# Patient Record
Sex: Female | Born: 1965
Health system: Southern US, Community
[De-identification: ages and names within clinical notes are randomized; demographics above are authoritative.]

## PROBLEM LIST (undated history)

## (undated) DIAGNOSIS — G8929 Other chronic pain: Secondary | ICD-10-CM

## (undated) DIAGNOSIS — F32A Depression, unspecified: Secondary | ICD-10-CM

## (undated) DIAGNOSIS — M549 Dorsalgia, unspecified: Secondary | ICD-10-CM

## (undated) DIAGNOSIS — F419 Anxiety disorder, unspecified: Secondary | ICD-10-CM

## (undated) DIAGNOSIS — F329 Major depressive disorder, single episode, unspecified: Secondary | ICD-10-CM

## (undated) HISTORY — PX: LAPAROSCOPIC GASTRIC SLEEVE RESECTION: SHX5895

## (undated) HISTORY — PX: KNEE ARTHROSCOPY: SUR90

## (undated) HISTORY — PX: OTHER SURGICAL HISTORY: SHX169

## (undated) HISTORY — PX: TUBAL LIGATION: SHX77

## (undated) HISTORY — PX: BACK SURGERY: SHX140

---

## 2007-04-01 DIAGNOSIS — G8929 Other chronic pain: Secondary | ICD-10-CM | POA: Insufficient documentation

## 2009-08-15 ENCOUNTER — Inpatient Hospital Stay (HOSPITAL_COMMUNITY): Admission: RE | Admit: 2009-08-15 | Discharge: 2009-08-17 | Payer: Self-pay | Admitting: Specialist

## 2010-01-30 ENCOUNTER — Inpatient Hospital Stay (HOSPITAL_COMMUNITY): Admission: RE | Admit: 2010-01-30 | Discharge: 2010-02-02 | Payer: Self-pay | Admitting: Orthopedic Surgery

## 2010-05-21 ENCOUNTER — Ambulatory Visit: Payer: Self-pay | Admitting: Diagnostic Radiology

## 2010-05-21 ENCOUNTER — Ambulatory Visit (HOSPITAL_BASED_OUTPATIENT_CLINIC_OR_DEPARTMENT_OTHER): Admission: RE | Admit: 2010-05-21 | Discharge: 2010-05-21 | Payer: Self-pay | Admitting: Obstetrics and Gynecology

## 2011-02-02 LAB — CBC
Hemoglobin: 13.3 g/dL (ref 12.0–15.0)
MCV: 82.8 fL (ref 78.0–100.0)
RDW: 15.2 % (ref 11.5–15.5)
WBC: 9.2 10*3/uL (ref 4.0–10.5)

## 2011-02-02 LAB — TYPE AND SCREEN: Antibody Screen: NEGATIVE

## 2011-02-12 LAB — URINE MICROSCOPIC-ADD ON

## 2011-02-12 LAB — BASIC METABOLIC PANEL
CO2: 24 mEq/L (ref 19–32)
Chloride: 105 mEq/L (ref 96–112)
Potassium: 3.7 mEq/L (ref 3.5–5.1)
Sodium: 135 mEq/L (ref 135–145)

## 2011-02-12 LAB — URINALYSIS, ROUTINE W REFLEX MICROSCOPIC
Bilirubin Urine: NEGATIVE
Glucose, UA: NEGATIVE mg/dL
Hgb urine dipstick: NEGATIVE
Protein, ur: NEGATIVE mg/dL

## 2011-02-12 LAB — CBC
MCHC: 34.3 g/dL (ref 30.0–36.0)
Platelets: 290 10*3/uL (ref 150–400)
RDW: 14.7 % (ref 11.5–15.5)

## 2011-05-25 ENCOUNTER — Ambulatory Visit: Payer: No Typology Code available for payment source | Attending: Psychiatry | Admitting: Rehabilitation

## 2011-05-25 ENCOUNTER — Other Ambulatory Visit: Payer: Self-pay

## 2011-05-25 ENCOUNTER — Other Ambulatory Visit (HOSPITAL_COMMUNITY)
Admission: RE | Admit: 2011-05-25 | Discharge: 2011-05-25 | Disposition: A | Discharge status: Home / Self Care | Payer: Medicaid Other | Source: Ambulatory Visit | Attending: Obstetrics and Gynecology | Admitting: Obstetrics and Gynecology

## 2011-05-25 ENCOUNTER — Other Ambulatory Visit: Payer: Self-pay | Admitting: Obstetrics and Gynecology

## 2011-05-25 DIAGNOSIS — M25549 Pain in joints of unspecified hand: Secondary | ICD-10-CM | POA: Insufficient documentation

## 2011-05-25 DIAGNOSIS — M6281 Muscle weakness (generalized): Secondary | ICD-10-CM | POA: Insufficient documentation

## 2011-05-25 DIAGNOSIS — Z01419 Encounter for gynecological examination (general) (routine) without abnormal findings: Secondary | ICD-10-CM | POA: Insufficient documentation

## 2011-05-25 DIAGNOSIS — M25649 Stiffness of unspecified hand, not elsewhere classified: Secondary | ICD-10-CM | POA: Insufficient documentation

## 2011-05-25 DIAGNOSIS — Z5189 Encounter for other specified aftercare: Secondary | ICD-10-CM | POA: Insufficient documentation

## 2011-05-25 DIAGNOSIS — Z1159 Encounter for screening for other viral diseases: Secondary | ICD-10-CM | POA: Insufficient documentation

## 2011-05-25 DIAGNOSIS — Z1231 Encounter for screening mammogram for malignant neoplasm of breast: Secondary | ICD-10-CM

## 2011-06-01 ENCOUNTER — Ambulatory Visit (HOSPITAL_BASED_OUTPATIENT_CLINIC_OR_DEPARTMENT_OTHER)
Admission: RE | Admit: 2011-06-01 | Discharge: 2011-06-01 | Disposition: A | Discharge status: Home / Self Care | Payer: Medicaid Other | Source: Ambulatory Visit | Attending: Obstetrics and Gynecology | Admitting: Obstetrics and Gynecology

## 2011-06-01 ENCOUNTER — Ambulatory Visit (HOSPITAL_BASED_OUTPATIENT_CLINIC_OR_DEPARTMENT_OTHER): Payer: No Typology Code available for payment source

## 2011-06-01 DIAGNOSIS — Z1231 Encounter for screening mammogram for malignant neoplasm of breast: Secondary | ICD-10-CM

## 2011-06-04 ENCOUNTER — Ambulatory Visit: Payer: No Typology Code available for payment source | Admitting: Occupational Therapy

## 2011-06-10 ENCOUNTER — Encounter: Payer: No Typology Code available for payment source | Admitting: Occupational Therapy

## 2011-06-12 ENCOUNTER — Ambulatory Visit: Payer: Medicaid Other | Attending: Sports Medicine | Admitting: Physical Therapy

## 2011-06-12 DIAGNOSIS — Z5189 Encounter for other specified aftercare: Secondary | ICD-10-CM | POA: Insufficient documentation

## 2011-06-12 DIAGNOSIS — M25649 Stiffness of unspecified hand, not elsewhere classified: Secondary | ICD-10-CM | POA: Insufficient documentation

## 2011-06-12 DIAGNOSIS — M6281 Muscle weakness (generalized): Secondary | ICD-10-CM | POA: Insufficient documentation

## 2011-06-12 DIAGNOSIS — M25549 Pain in joints of unspecified hand: Secondary | ICD-10-CM | POA: Insufficient documentation

## 2011-06-17 ENCOUNTER — Ambulatory Visit: Payer: Medicaid Other | Admitting: Physical Therapy

## 2011-06-17 ENCOUNTER — Ambulatory Visit: Payer: Medicaid Other | Admitting: Occupational Therapy

## 2011-06-19 ENCOUNTER — Encounter: Payer: No Typology Code available for payment source | Admitting: Occupational Therapy

## 2011-06-19 ENCOUNTER — Ambulatory Visit: Payer: No Typology Code available for payment source | Admitting: Physical Therapy

## 2011-06-23 ENCOUNTER — Ambulatory Visit: Payer: Medicaid Other | Admitting: Occupational Therapy

## 2011-06-23 ENCOUNTER — Ambulatory Visit: Payer: No Typology Code available for payment source | Attending: Psychiatry | Admitting: Physical Therapy

## 2011-06-23 DIAGNOSIS — M25649 Stiffness of unspecified hand, not elsewhere classified: Secondary | ICD-10-CM | POA: Insufficient documentation

## 2011-06-23 DIAGNOSIS — M25549 Pain in joints of unspecified hand: Secondary | ICD-10-CM | POA: Insufficient documentation

## 2011-06-23 DIAGNOSIS — M6281 Muscle weakness (generalized): Secondary | ICD-10-CM | POA: Insufficient documentation

## 2011-06-23 DIAGNOSIS — Z5189 Encounter for other specified aftercare: Secondary | ICD-10-CM | POA: Insufficient documentation

## 2011-06-25 ENCOUNTER — Ambulatory Visit: Payer: Medicaid Other | Admitting: Occupational Therapy

## 2011-06-25 ENCOUNTER — Ambulatory Visit: Payer: No Typology Code available for payment source | Admitting: Physical Therapy

## 2011-06-29 ENCOUNTER — Ambulatory Visit: Payer: No Typology Code available for payment source | Admitting: Physical Therapy

## 2011-06-29 ENCOUNTER — Ambulatory Visit: Payer: Medicaid Other | Admitting: Occupational Therapy

## 2011-07-01 ENCOUNTER — Ambulatory Visit: Payer: No Typology Code available for payment source | Admitting: Physical Therapy

## 2011-07-01 ENCOUNTER — Ambulatory Visit: Payer: Medicaid Other | Admitting: Occupational Therapy

## 2011-07-07 ENCOUNTER — Ambulatory Visit: Payer: Medicaid Other | Admitting: Occupational Therapy

## 2011-07-07 ENCOUNTER — Ambulatory Visit: Payer: No Typology Code available for payment source | Admitting: Physical Therapy

## 2011-07-10 ENCOUNTER — Ambulatory Visit: Payer: Medicaid Other | Admitting: Occupational Therapy

## 2011-07-10 ENCOUNTER — Ambulatory Visit: Payer: No Typology Code available for payment source | Admitting: Physical Therapy

## 2011-07-20 ENCOUNTER — Ambulatory Visit: Payer: Medicaid Other | Admitting: Physical Therapy

## 2011-07-20 ENCOUNTER — Ambulatory Visit: Payer: Medicaid Other | Attending: Sports Medicine | Admitting: Occupational Therapy

## 2011-07-20 DIAGNOSIS — M6281 Muscle weakness (generalized): Secondary | ICD-10-CM | POA: Insufficient documentation

## 2011-07-20 DIAGNOSIS — M25649 Stiffness of unspecified hand, not elsewhere classified: Secondary | ICD-10-CM | POA: Insufficient documentation

## 2011-07-20 DIAGNOSIS — M25549 Pain in joints of unspecified hand: Secondary | ICD-10-CM | POA: Insufficient documentation

## 2011-07-20 DIAGNOSIS — Z5189 Encounter for other specified aftercare: Secondary | ICD-10-CM | POA: Insufficient documentation

## 2011-07-24 ENCOUNTER — Ambulatory Visit: Payer: No Typology Code available for payment source | Admitting: Physical Therapy

## 2011-07-24 ENCOUNTER — Encounter: Payer: No Typology Code available for payment source | Admitting: Occupational Therapy

## 2011-07-27 ENCOUNTER — Ambulatory Visit: Payer: Medicaid Other | Admitting: Physical Therapy

## 2011-07-27 ENCOUNTER — Ambulatory Visit: Payer: Medicaid Other | Admitting: Occupational Therapy

## 2011-07-29 ENCOUNTER — Ambulatory Visit: Payer: Medicaid Other | Admitting: Physical Therapy

## 2011-07-29 ENCOUNTER — Ambulatory Visit: Payer: Medicaid Other | Admitting: Occupational Therapy

## 2011-07-31 ENCOUNTER — Emergency Department (HOSPITAL_BASED_OUTPATIENT_CLINIC_OR_DEPARTMENT_OTHER)
Admission: EM | Admit: 2011-07-31 | Discharge: 2011-08-01 | Disposition: A | Discharge status: Home / Self Care | Payer: Medicaid Other | Attending: Emergency Medicine | Admitting: Emergency Medicine

## 2011-07-31 ENCOUNTER — Encounter: Payer: Self-pay | Admitting: *Deleted

## 2011-07-31 DIAGNOSIS — R1032 Left lower quadrant pain: Secondary | ICD-10-CM | POA: Insufficient documentation

## 2011-07-31 DIAGNOSIS — Z79899 Other long term (current) drug therapy: Secondary | ICD-10-CM | POA: Insufficient documentation

## 2011-07-31 LAB — URINE MICROSCOPIC-ADD ON

## 2011-07-31 LAB — URINALYSIS, ROUTINE W REFLEX MICROSCOPIC
Glucose, UA: NEGATIVE mg/dL
Specific Gravity, Urine: 1.031 — ABNORMAL HIGH (ref 1.005–1.030)

## 2011-07-31 MED ORDER — SODIUM CHLORIDE 0.9 % IV BOLUS (SEPSIS)
1000.0000 mL | Freq: Once | INTRAVENOUS | Status: AC
  Administered 2011-07-31: 1000 mL via INTRAVENOUS

## 2011-07-31 MED ORDER — MORPHINE SULFATE 4 MG/ML IJ SOLN
4.0000 mg | Freq: Once | INTRAMUSCULAR | Status: AC
  Administered 2011-07-31: 4 mg via INTRAVENOUS
  Filled 2011-07-31: qty 1

## 2011-07-31 NOTE — ED Notes (Signed)
Pt states lower abd pain with painful urination  X 4 hrs

## 2011-08-01 ENCOUNTER — Emergency Department (INDEPENDENT_AMBULATORY_CARE_PROVIDER_SITE_OTHER): Payer: Medicaid Other

## 2011-08-01 DIAGNOSIS — R1032 Left lower quadrant pain: Secondary | ICD-10-CM

## 2011-08-01 LAB — CBC
HCT: 37.3 % (ref 36.0–46.0)
MCH: 27.4 pg (ref 26.0–34.0)
MCV: 83.6 fL (ref 78.0–100.0)
Platelets: 266 10*3/uL (ref 150–400)
RBC: 4.46 MIL/uL (ref 3.87–5.11)
RDW: 14.9 % (ref 11.5–15.5)

## 2011-08-01 LAB — DIFFERENTIAL
Eosinophils Absolute: 0.1 10*3/uL (ref 0.0–0.7)
Eosinophils Relative: 1 % (ref 0–5)
Lymphs Abs: 2.5 10*3/uL (ref 0.7–4.0)
Monocytes Absolute: 0.8 10*3/uL (ref 0.1–1.0)

## 2011-08-01 LAB — BASIC METABOLIC PANEL
Calcium: 9 mg/dL (ref 8.4–10.5)
Creatinine, Ser: 0.8 mg/dL (ref 0.50–1.10)
GFR calc non Af Amer: >60 mL/min (ref >60)
Glucose, Bld: 98 mg/dL (ref 70–99)
Sodium: 142 mEq/L (ref 135–145)

## 2011-08-01 MED ORDER — IOHEXOL 300 MG/ML  SOLN
100.0000 mL | Freq: Once | INTRAMUSCULAR | Status: AC | PRN
  Administered 2011-08-01: 100 mL via INTRAVENOUS

## 2011-08-01 NOTE — ED Provider Notes (Signed)
History     CSN: [id number] Arrival date & time: 07/31/2011 10:35 PM  Chief Complaint  Patient presents with  . Abdominal Pain    HPI  (Consider location/radiation/quality/duration/timing/severity/associated sxs/prior treatment)  HPI Comments: 45 year old female previously healthy presents with abdominal pain. Patient states that last night around 7:30 PM she began to experience constant crampy left lower quadrant abdominal pain without radiation. She does have chronic back pain states that her chronic back pain is at baseline. She denies constipation, diarrhea. Denies fevers chills, nausea vomiting. Denies hematuria dysuria frequency urgency. Reports no vaginal bleeding or vaginal discharge. She is sexually active and does have a history of sexual transmitted infections 2 years ago, trichomonas which was treated. States her pain as a 9/10 at this time. She did not take anything prior to arrival. No history of similar.    Hennie Duos, RN 07/31/2011 22:30     Pt states lower abd pain with painful urination  X 4 hrs --- patient denies dysuria to me.   Patient is a 45 y.o. female presenting with abdominal pain.  Abdominal Pain The primary symptoms of the illness include abdominal pain.    History reviewed. No pertinent past medical history.  Past Surgical History  Procedure Date  . Back surgery   . Joint replacement   . Tubal ligation     History reviewed. No pertinent family history.  History  Substance Use Topics  . Smoking status: Never Smoker   . Smokeless tobacco: Not on file  . Alcohol Use: No    OB History    Grav Para Term Preterm Abortions TAB SAB Ect Mult Living                  Review of Systems  Review of Systems  Gastrointestinal: Positive for abdominal pain.  All other systems reviewed and are negative.   except as noted history of present illness  Allergies  Review of patient's allergies indicates no known allergies.  Home Medications    Current Outpatient Rx  Name Route Sig Dispense Refill  . CELECOXIB 200 MG PO CAPS Oral Take 200 mg by mouth daily.      Marland Kitchen VITAMIN D 1000 UNITS PO TABS Oral Take 1,000 Units by mouth daily.      Marland Kitchen VITAMIN B-12 1000 MCG/15ML PO LIQD Oral Take 1,000 mcg by mouth daily.      . CYCLOBENZAPRINE HCL 10 MG PO TABS Oral Take 10 mg by mouth 3 (three) times daily as needed. For muscle spasms     . OMEGA-3 FATTY ACIDS 1000 MG PO CAPS Oral Take 1 g by mouth daily.      Marland Kitchen GABAPENTIN 300 MG PO CAPS Oral Take 300 mg by mouth 2 (two) times daily.      Marland Kitchen HYDROCODONE-ACETAMINOPHEN 10-325 MG PO TABS Oral Take 1 tablet by mouth 3 (three) times daily.      Marland Kitchen VITAMIN E 400 UNITS PO CAPS Oral Take 400 Units by mouth daily.        Physical Exam    BP 101/69  Pulse 75  Temp(Src) 98.3 F (36.8 C) (Oral)  Resp 16  Ht 5\' 6"  (1.676 m)  Wt 230 lb (104.327 kg)  BMI 37.12 kg/m2  SpO2 99%  LMP 07/17/2011  Physical Exam  Nursing note and vitals reviewed. Constitutional: She is oriented to person, place, and time. She appears well-developed.  HENT:  Head: Atraumatic.  Mouth/Throat: Oropharynx is clear and moist.  Eyes: Conjunctivae and  EOM are normal. Pupils are equal, round, and reactive to light.  Neck: Normal range of motion. Neck supple.  Cardiovascular: Normal rate, regular rhythm, normal heart sounds and intact distal pulses.   Pulmonary/Chest: Effort normal and breath sounds normal. No respiratory distress. She has no wheezes. She has no rales.  Abdominal: Soft. She exhibits no distension and no mass. There is tenderness. There is no rebound and no guarding.       Left lower quadrant tenderness to palpation no rebounding no guarding groin unremarkable  Genitourinary: No vaginal discharge found.       External genitalia normal appearing no vaginal discharge cervix normal. No cervical motion tenderness mild left adnexal tenderness  Musculoskeletal: Normal range of motion.  Neurological: She is alert and  oriented to person, place, and time.  Skin: Skin is warm and dry. No rash noted.  Psychiatric: She has a normal mood and affect.    ED Course  Procedures (including critical care time)  Labs Reviewed  URINALYSIS, ROUTINE W REFLEX MICROSCOPIC - Abnormal; Notable for the following:    Color, Urine AMBER (*) BIOCHEMICALS MAY BE AFFECTED BY COLOR   Appearance CLOUDY (*)    Specific Gravity, Urine 1.031 (*)    Bilirubin Urine SMALL (*)    Ketones, ur 15 (*)    Protein, ur 30 (*)    All other components within normal limits  URINE MICROSCOPIC-ADD ON - Abnormal; Notable for the following:    Squamous Epithelial / LPF MANY (*)    Bacteria, UA MANY (*)    All other components within normal limits  PREGNANCY, URINE  WET PREP, GENITAL  GC/CHLAMYDIA PROBE AMP, GENITAL  CBC  DIFFERENTIAL  BASIC METABOLIC PANEL   No results found.   No diagnosis found.   MDM 45 year old female presents with left lower quadrant abdominal pain. Differential diagnosis includes diverticulitis, colitis, ovarian cyst, TO a, ovarian torsion, UTI. We'll check basic labs send wet prep and GC chlamydia. CT abdomen and pelvis to evaluate. Pain control IV fluids. Reassess.  Stefano Gaul, MD  1:34 AM labs reviewed and unremarkable. CT abdomen and pelvis unremarkable including no suspicious adnexal masses and no diverticulitis. Patient is feeling better. Pain control primary care followup. Patient is comfortable with plan      Forbes Cellar, MD 08/01/11 [phone]

## 2011-08-03 ENCOUNTER — Ambulatory Visit: Payer: Medicaid Other | Admitting: Physical Therapy

## 2011-08-03 LAB — GC/CHLAMYDIA PROBE AMP, GENITAL
Chlamydia, DNA Probe: NEGATIVE
GC Probe Amp, Genital: NEGATIVE

## 2011-08-05 ENCOUNTER — Encounter: Payer: No Typology Code available for payment source | Admitting: Occupational Therapy

## 2011-08-06 ENCOUNTER — Ambulatory Visit: Payer: Medicaid Other | Admitting: Physical Therapy

## 2011-08-07 ENCOUNTER — Encounter: Payer: No Typology Code available for payment source | Admitting: Occupational Therapy

## 2011-09-03 ENCOUNTER — Other Ambulatory Visit: Payer: Self-pay | Admitting: Obstetrics and Gynecology

## 2012-05-09 ENCOUNTER — Encounter (HOSPITAL_COMMUNITY): Payer: Self-pay | Admitting: *Deleted

## 2012-05-14 ENCOUNTER — Encounter (HOSPITAL_COMMUNITY): Payer: Self-pay | Admitting: Pharmacist

## 2012-05-27 ENCOUNTER — Encounter (HOSPITAL_COMMUNITY): Admission: RE | Payer: Self-pay | Source: Ambulatory Visit

## 2012-05-27 ENCOUNTER — Ambulatory Visit (HOSPITAL_COMMUNITY)
Admission: RE | Admit: 2012-05-27 | Payer: Medicaid Other | Source: Ambulatory Visit | Admitting: Obstetrics & Gynecology

## 2012-05-27 HISTORY — DX: Other chronic pain: G89.29

## 2012-05-27 HISTORY — DX: Dorsalgia, unspecified: M54.9

## 2012-05-27 SURGERY — DILATATION & CURETTAGE/HYSTEROSCOPY WITH NOVASURE ABLATION
Anesthesia: Choice

## 2012-06-07 ENCOUNTER — Other Ambulatory Visit: Payer: Self-pay | Admitting: Obstetrics & Gynecology

## 2012-06-07 ENCOUNTER — Encounter (HOSPITAL_COMMUNITY)
Admission: RE | Disposition: A | Discharge status: Home / Self Care | Payer: Self-pay | Source: Ambulatory Visit | Attending: Obstetrics & Gynecology

## 2012-06-07 ENCOUNTER — Encounter (HOSPITAL_COMMUNITY): Payer: Self-pay | Admitting: *Deleted

## 2012-06-07 SURGERY — HYSTEROSCOPY WITH NOVASURE
Anesthesia: Choice

## 2012-06-07 MED ORDER — DOXYCYCLINE HYCLATE 100 MG IV SOLR: 100.0000 mg | Freq: Two times a day (BID) | INTRAVENOUS | Status: DC
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
## 2012-06-08 ENCOUNTER — Encounter (HOSPITAL_COMMUNITY)
Admission: RE | Disposition: A | Discharge status: Home / Self Care | Payer: Self-pay | Source: Ambulatory Visit | Attending: Obstetrics & Gynecology

## 2012-06-08 ENCOUNTER — Encounter (HOSPITAL_COMMUNITY): Payer: Self-pay | Admitting: Anesthesiology

## 2012-06-08 ENCOUNTER — Encounter (HOSPITAL_COMMUNITY): Payer: Self-pay | Admitting: *Deleted

## 2012-06-08 ENCOUNTER — Ambulatory Visit (HOSPITAL_COMMUNITY)
Admission: RE | Admit: 2012-06-08 | Discharge: 2012-06-08 | Disposition: A | Discharge status: Home / Self Care | Payer: Medicaid Other | Source: Ambulatory Visit | Attending: Obstetrics & Gynecology | Admitting: Obstetrics & Gynecology

## 2012-06-08 ENCOUNTER — Ambulatory Visit (HOSPITAL_COMMUNITY): Payer: Medicaid Other | Admitting: Anesthesiology

## 2012-06-08 DIAGNOSIS — D259 Leiomyoma of uterus, unspecified: Secondary | ICD-10-CM | POA: Insufficient documentation

## 2012-06-08 DIAGNOSIS — N921 Excessive and frequent menstruation with irregular cycle: Secondary | ICD-10-CM

## 2012-06-08 DIAGNOSIS — N920 Excessive and frequent menstruation with regular cycle: Secondary | ICD-10-CM | POA: Insufficient documentation

## 2012-06-08 DIAGNOSIS — N939 Abnormal uterine and vaginal bleeding, unspecified: Secondary | ICD-10-CM

## 2012-06-08 LAB — CBC
HCT: 39.4 % (ref 36.0–46.0)
Hemoglobin: 12.4 g/dL (ref 12.0–15.0)
MCV: 85.1 fL (ref 78.0–100.0)
RBC: 4.63 MIL/uL (ref 3.87–5.11)
WBC: 7.5 10*3/uL (ref 4.0–10.5)

## 2012-06-08 SURGERY — DILATATION & CURETTAGE/HYSTEROSCOPY WITH THERMACHOICE ABLATION
Anesthesia: General | Site: Vagina | Wound class: Clean Contaminated

## 2012-06-08 MED ORDER — LIDOCAINE HCL (CARDIAC) 20 MG/ML IV SOLN
INTRAVENOUS | Status: AC
  Filled 2012-06-08: qty 5

## 2012-06-08 MED ORDER — KETOROLAC TROMETHAMINE 30 MG/ML IJ SOLN
INTRAMUSCULAR | Status: AC
  Filled 2012-06-08: qty 1

## 2012-06-08 MED ORDER — FENTANYL CITRATE 0.05 MG/ML IJ SOLN
25.0000 ug | INTRAMUSCULAR | Status: DC | PRN
  Administered 2012-06-08 (×2): 50 ug via INTRAVENOUS

## 2012-06-08 MED ORDER — ONDANSETRON HCL 4 MG/2ML IJ SOLN
INTRAMUSCULAR | Status: AC
  Filled 2012-06-08: qty 2

## 2012-06-08 MED ORDER — PHENYLEPHRINE 40 MCG/ML (10ML) SYRINGE FOR IV PUSH (FOR BLOOD PRESSURE SUPPORT)
PREFILLED_SYRINGE | INTRAVENOUS | Status: AC
  Filled 2012-06-08: qty 5

## 2012-06-08 MED ORDER — DEXAMETHASONE SODIUM PHOSPHATE 10 MG/ML IJ SOLN
INTRAMUSCULAR | Status: AC
  Filled 2012-06-08: qty 1

## 2012-06-08 MED ORDER — KETOROLAC TROMETHAMINE 30 MG/ML IJ SOLN: 15.0000 mg | Freq: Once | INTRAMUSCULAR | Status: DC | PRN

## 2012-06-08 MED ORDER — MIDAZOLAM HCL 2 MG/2ML IJ SOLN: 0.5000 mg | Freq: Once | INTRAMUSCULAR | Status: DC | PRN

## 2012-06-08 MED ORDER — MIDAZOLAM HCL 5 MG/5ML IJ SOLN
INTRAMUSCULAR | Status: DC | PRN
  Administered 2012-06-08: 2 mg via INTRAVENOUS

## 2012-06-08 MED ORDER — NEOSTIGMINE METHYLSULFATE 1 MG/ML IJ SOLN
INTRAMUSCULAR | Status: AC
  Filled 2012-06-08: qty 10

## 2012-06-08 MED ORDER — LACTATED RINGERS IR SOLN
Status: DC | PRN
  Administered 2012-06-08: 3000 mL

## 2012-06-08 MED ORDER — DEXTROSE 5 % IV SOLN
INTRAVENOUS | Status: DC | PRN
  Administered 2012-06-08: 250 mL

## 2012-06-08 MED ORDER — ONDANSETRON HCL 4 MG/2ML IJ SOLN
INTRAMUSCULAR | Status: DC | PRN
  Administered 2012-06-08: 4 mg via INTRAVENOUS

## 2012-06-08 MED ORDER — FENTANYL CITRATE 0.05 MG/ML IJ SOLN
INTRAMUSCULAR | Status: AC
  Filled 2012-06-08: qty 2

## 2012-06-08 MED ORDER — MEPERIDINE HCL 25 MG/ML IJ SOLN: 6.2500 mg | INTRAMUSCULAR | Status: DC | PRN

## 2012-06-08 MED ORDER — FENTANYL CITRATE 0.05 MG/ML IJ SOLN
INTRAMUSCULAR | Status: AC
  Administered 2012-06-08: 50 ug via INTRAVENOUS
  Filled 2012-06-08: qty 2

## 2012-06-08 MED ORDER — HYDROCODONE-ACETAMINOPHEN 5-325 MG PO TABS
ORAL_TABLET | ORAL | Status: AC
  Filled 2012-06-08: qty 1

## 2012-06-08 MED ORDER — MIDAZOLAM HCL 2 MG/2ML IJ SOLN
INTRAMUSCULAR | Status: AC
  Filled 2012-06-08: qty 2

## 2012-06-08 MED ORDER — PROMETHAZINE HCL 25 MG/ML IJ SOLN: 6.2500 mg | INTRAMUSCULAR | Status: DC | PRN

## 2012-06-08 MED ORDER — PROPOFOL 10 MG/ML IV EMUL
INTRAVENOUS | Status: AC
  Filled 2012-06-08: qty 20

## 2012-06-08 MED ORDER — SILVER NITRATE-POT NITRATE 75-25 % EX MISC
CUTANEOUS | Status: AC
  Filled 2012-06-08: qty 2

## 2012-06-08 MED ORDER — PHENYLEPHRINE HCL 10 MG/ML IJ SOLN
INTRAMUSCULAR | Status: DC | PRN
  Administered 2012-06-08 (×2): 80 ug via INTRAVENOUS

## 2012-06-08 MED ORDER — FENTANYL CITRATE 0.05 MG/ML IJ SOLN
INTRAMUSCULAR | Status: DC | PRN
  Administered 2012-06-08: 50 ug via INTRAVENOUS
  Administered 2012-06-08: 100 ug via INTRAVENOUS

## 2012-06-08 MED ORDER — HYDROCODONE-ACETAMINOPHEN 5-325 MG PO TABS
1.0000 | ORAL_TABLET | Freq: Once | ORAL | Status: AC
  Administered 2012-06-08: 1 via ORAL

## 2012-06-08 MED ORDER — DOXYCYCLINE HYCLATE 100 MG IV SOLR
200.0000 mg | INTRAVENOUS | Status: AC
  Administered 2012-06-08: 200 mg via INTRAVENOUS
  Filled 2012-06-08: qty 200

## 2012-06-08 MED ORDER — PROPOFOL 10 MG/ML IV EMUL
INTRAVENOUS | Status: DC | PRN
  Administered 2012-06-08: 190 mg via INTRAVENOUS
  Administered 2012-06-08: 30 mg via INTRAVENOUS
  Administered 2012-06-08 (×2): 50 mg via INTRAVENOUS

## 2012-06-08 MED ORDER — GLYCOPYRROLATE 0.2 MG/ML IJ SOLN
INTRAMUSCULAR | Status: AC
  Filled 2012-06-08: qty 1

## 2012-06-08 MED ORDER — KETOROLAC TROMETHAMINE 30 MG/ML IJ SOLN
INTRAMUSCULAR | Status: DC | PRN
  Administered 2012-06-08: 30 mg via INTRAVENOUS

## 2012-06-08 MED ORDER — LACTATED RINGERS IV SOLN
INTRAVENOUS | Status: DC
  Administered 2012-06-08 (×2): via INTRAVENOUS

## 2012-06-08 MED ORDER — LIDOCAINE HCL (CARDIAC) 20 MG/ML IV SOLN
INTRAVENOUS | Status: DC | PRN
  Administered 2012-06-08: 50 mg via INTRAVENOUS

## 2012-06-08 MED ORDER — SILVER NITRATE-POT NITRATE 75-25 % EX MISC
CUTANEOUS | Status: DC | PRN
  Administered 2012-06-08: 2

## 2012-06-08 MED ORDER — DEXAMETHASONE SODIUM PHOSPHATE 10 MG/ML IJ SOLN
INTRAMUSCULAR | Status: DC | PRN
  Administered 2012-06-08: 10 mg via INTRAVENOUS

## 2012-06-08 SURGICAL SUPPLY — 16 items
CATH ROBINSON RED A/P 16FR (CATHETERS) ×3 IMPLANT
CATH THERMACHOICE III (CATHETERS) ×3 IMPLANT
CLOTH BEACON ORANGE TIMEOUT ST (SAFETY) ×3 IMPLANT
GLOVE BIO SURGEON STRL SZ7.5 (GLOVE) ×3 IMPLANT
GLOVE BIOGEL PI IND STRL 7.5 (GLOVE) ×2 IMPLANT
GLOVE BIOGEL PI IND STRL 8 (GLOVE) ×2 IMPLANT
GLOVE BIOGEL PI INDICATOR 7.5 (GLOVE) ×1
GLOVE BIOGEL PI INDICATOR 8 (GLOVE) ×1
GLOVE SURG SS PI 7.0 STRL IVOR (GLOVE) ×3 IMPLANT
GOWN PREVENTION PLUS LG XLONG (DISPOSABLE) ×3 IMPLANT
GOWN STRL REIN XL XLG (GOWN DISPOSABLE) ×3 IMPLANT
PACK HYSTEROSCOPY LF (CUSTOM PROCEDURE TRAY) ×3 IMPLANT
SUT VIC AB 2-0 CT1 27 (SUTURE) ×1
SUT VIC AB 2-0 CT1 TAPERPNT 27 (SUTURE) ×2 IMPLANT
TOWEL OR 17X24 6PK STRL BLUE (TOWEL DISPOSABLE) ×6 IMPLANT
WATER STERILE IRR 1000ML POUR (IV SOLUTION) ×3 IMPLANT

## 2012-06-08 NOTE — Transfer of Care (Signed)
Immediate Anesthesia Transfer of Care Note  Patient: Robin Schmidt  Procedure(s) Performed: Procedure(s) (LRB): DILATATION & CURETTAGE/HYSTEROSCOPY WITH THERMACHOICE ABLATION (N/A)  Patient Location: PACU  Anesthesia Type: General  Level of Consciousness: awake  Airway & Oxygen Therapy: Patient Spontanous Breathing  Post-op Assessment: Report given to PACU RN  Post vital signs: Reviewed and stable  Complications: No apparent anesthesia complications

## 2012-06-08 NOTE — Anesthesia Postprocedure Evaluation (Signed)
Anesthesia Post Note  Patient: Robin Schmidt  Procedure(s) Performed: Procedure(s) (LRB): DILATATION & CURETTAGE/HYSTEROSCOPY WITH THERMACHOICE ABLATION (N/A)  Anesthesia type: General  Patient location: PACU  Post pain: Pain level controlled  Post assessment: Post-op Vital signs reviewed  Last Vitals:  Filed Vitals:   06/08/12 1600  BP: 118/59  Pulse: 72  Temp:   Resp: 24    Post vital signs: Reviewed  Level of consciousness: sedated  Complications: No apparent anesthesia complicationsfj
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
## 2012-06-08 NOTE — Interval H&P Note (Signed)
History and Physical Interval Note:  06/08/2012 1:07 PM  Robin Schmidt  has presented today for surgery, with the diagnosis of menorrhagia  The various methods of treatment have been discussed with the patient and family. After consideration of risks, benefits and other options for treatment, the patient has consented to  Procedure(s) (LRB): DILATATION & CURETTAGE/HYSTEROSCOPY WITH NOVASURE ABLATION (N/A) as a surgical intervention .  The patient's history has been reviewed, patient examined, no change in status, stable for surgery.  I have reviewed the patient's chart and labs.  Questions were answered to the patient's satisfaction.     Robin Schmidt H.  Pt seen and agrees with the plan and the reason and all questions anwered and nothing is to change with plan

## 2012-06-08 NOTE — Anesthesia Preprocedure Evaluation (Addendum)
Anesthesia Evaluation  Patient identified by MRN, date of birth, ID band Patient awake    Reviewed: Allergy & Precautions, H&P , Patient's Chart, lab work & pertinent test results, reviewed documented beta blocker date and time   History of Anesthesia Complications Negative for: history of anesthetic complications  Airway Mallampati: II TM Distance: >3 FB Neck ROM: full    Dental No notable dental hx.    Pulmonary neg pulmonary ROS,  breath sounds clear to auscultation  Pulmonary exam normal       Cardiovascular Exercise Tolerance: Good negative cardio ROS  Rhythm:regular Rate:Normal     Neuro/Psych negative neurological ROS  negative psych ROS   GI/Hepatic negative GI ROS, Neg liver ROS,   Endo/Other  negative endocrine ROS  Renal/GU negative Renal ROS     Musculoskeletal   Abdominal   Peds  Hematology negative hematology ROS (+)   Anesthesia Other Findings Back pain, chronic  Reproductive/Obstetrics negative OB ROS                           Anesthesia Physical Anesthesia Plan  ASA: II  Anesthesia Plan: General LMA   Post-op Pain Management:    Induction:   Airway Management Planned:   Additional Equipment:   Intra-op Plan:   Post-operative Plan:   Informed Consent: I have reviewed the patients History and Physical, chart, labs and discussed the procedure including the risks, benefits and alternatives for the proposed anesthesia with the patient or authorized representative who has indicated his/her understanding and acceptance.   Dental Advisory Given  Plan Discussed with: CRNA, Surgeon and Anesthesiologist  Anesthesia Plan Comments:        position awake Anesthesia Quick Evaluation

## 2012-06-08 NOTE — Op Note (Addendum)
06/08/2012  3:13 PM  PATIENT:  Robin Schmidt  46 y.o. female  PRE-OPERATIVE DIAGNOSIS:  menorrhagia, fibroid uterus, menometrorrhagia  POST-OPERATIVE DIAGNOSIS:  menorrhagia  PROCEDURE:  Procedure(s): DILATATION & CURETTAGE/HYSTEROSCOPY WITH THERMACHOICE ABLATION Aborted NovaSure ablation  SURGEON:  Surgeon(s): Delbert Harness, MD  PHYSICIAN ASSISTANT:   ASSISTANTS: none   ANESTHESIA:   general  EBL:  Total I/O In: 500 [I.V.:500] Out: 110 [Urine:100; Blood:10]  BLOOD ADMINISTERED:none  DRAINS: none   LOCAL MEDICATIONS USED:  NONE  SPECIMEN:  Source of Specimen:  Endometrial curettings  DISPOSITION OF SPECIMEN:  PATHOLOGY  COUNTS:  YES  TOURNIQUET:  * No tourniquets in log *  DICTATION: .Dragon Dictation  PLAN OF CARE: Discharge to home after PACU  PATIENT DISPOSITION:  PACU - hemodynamically stable.    Findings: Uterus sounded to 11 cm.  The cervical length was 5 cm.  The width of the cavity did appear to be small and hysteroscopy.  There was no evidence of fibroid or polyp and endometrial cavity or the endocervical cavity. The NovaSure would not open past 2.2 C. and this upon positioning.  However the fact this is no contraindication the placement of the uterus was confirmed several times.  The company was called to help line.  The recommendation to the globe the intolerable the device prior to locking and position was attempted twice same result of the width.  The same resolved a 2.2 cm with was also performed after shortening the cavity length from 6 cm to 5.5 cm.  At this point the recommendation was to abort the procedure.  NovaSure ablation was performed without difficulty.  Postprocedure hysteroscopy revealed a endometrium was ablated.  Issues were taken    Indication for procedure: [name] with a history of BTL had tried to use an IUD in the past, hormonal manipulation the past, and had an endometrial biopsy which was benign and an ultrasound which  revealed 2 subserosal fibroids.  She does not want hysterectomy as opted for global endometrial ablation.  Description procedure: Patient was identified in the preoperative holding area.  Was in the operating room the patient was placed supine on the OR table and general incision was mesh a difficult.  The patient and the modified lithotomy position with use of bucket stirrups.  The patient was then placed in slight Trendelenburg.  Active time out was performed to match patient with procedure.  Single-tooth neck was placed into the cervix uterus was noted to sound to 11 cm.  Cervical length was 5 cm. Exam under anesthesia revealed the above findings of a 5 cm fundal fibroid.  The distending media was lactated Ringer's. The 5 mm sheath with a 4.5 mm hysteroscope 30 for oblique angle was inserted into the uterine cavity.  The above findings were noted.  The patient did have what appeared to be a lot of endometrium in the endometrial cavity.  Or suspicious areas for tubal ostia but no clear evidence of tubal ostia.  There were no evidence of fibroids or polyps and endometrial endocervical cavity.Marland Kitchen  Uterine cavity was curetted.  The dilatation did not have to take place secondary to the fact that the cervical os was easily admitted the NovaSure device.  The NovaSure device was attempted to be engaged.  However the width of the cavity was was never greater than 2.2 or 2.3 cm. Company help desk was called and the recommendations were attempted as above but decided to abort the procedure.l   The ThermaChoice device  was then inserted to 11 cm, preheating was performed and then the treatment process was initiated in 2 minutes and 17 seconds were performed for patient block and the pressure cause the machine to discontinue the procedure.  This was reattempted and the treatment time proceeded to 5 minutes and 45 seconds.  For a total 8 minutes and 2 seconds of treatment therapy. A hysteroscopy was once again  performed and revealed a ablated endometrium  Silver nitrate was applied to the tenaculum site.  However a 2.0 Vicryl repeat stitch was placed to control the bleeding.  Hemostasis.  Was obtained.  The patient was replaced in supine position and reversed from general anesthesia and taken to the recovery room in awake and stable condition.

## 2012-06-22 ENCOUNTER — Other Ambulatory Visit: Payer: Self-pay | Admitting: Obstetrics and Gynecology

## 2012-06-22 ENCOUNTER — Other Ambulatory Visit (HOSPITAL_BASED_OUTPATIENT_CLINIC_OR_DEPARTMENT_OTHER): Payer: Self-pay | Admitting: Obstetrics & Gynecology

## 2012-06-22 DIAGNOSIS — Z1231 Encounter for screening mammogram for malignant neoplasm of breast: Secondary | ICD-10-CM

## 2012-07-04 ENCOUNTER — Inpatient Hospital Stay (HOSPITAL_BASED_OUTPATIENT_CLINIC_OR_DEPARTMENT_OTHER): Admission: RE | Admit: 2012-07-04 | Payer: Medicaid Other | Source: Ambulatory Visit

## 2012-12-19 ENCOUNTER — Ambulatory Visit (HOSPITAL_BASED_OUTPATIENT_CLINIC_OR_DEPARTMENT_OTHER)
Admission: RE | Admit: 2012-12-19 | Discharge: 2012-12-19 | Disposition: A | Discharge status: Home / Self Care | Payer: Medicaid Other | Source: Ambulatory Visit | Attending: Obstetrics & Gynecology | Admitting: Obstetrics & Gynecology

## 2012-12-19 DIAGNOSIS — Z1231 Encounter for screening mammogram for malignant neoplasm of breast: Secondary | ICD-10-CM | POA: Insufficient documentation

## 2013-01-31 ENCOUNTER — Ambulatory Visit: Payer: No Typology Code available for payment source | Admitting: Occupational Therapy

## 2013-02-15 ENCOUNTER — Ambulatory Visit: Payer: Medicaid Other | Attending: Orthopedic Surgery | Admitting: Occupational Therapy

## 2013-02-15 DIAGNOSIS — M6281 Muscle weakness (generalized): Secondary | ICD-10-CM | POA: Insufficient documentation

## 2013-02-15 DIAGNOSIS — IMO0001 Reserved for inherently not codable concepts without codable children: Secondary | ICD-10-CM | POA: Insufficient documentation

## 2013-02-15 DIAGNOSIS — M25549 Pain in joints of unspecified hand: Secondary | ICD-10-CM | POA: Insufficient documentation

## 2013-02-23 ENCOUNTER — Ambulatory Visit: Payer: Medicaid Other | Admitting: Occupational Therapy

## 2013-02-28 ENCOUNTER — Ambulatory Visit: Payer: Medicaid Other | Admitting: Occupational Therapy

## 2013-03-02 ENCOUNTER — Ambulatory Visit: Payer: Medicaid Other | Admitting: Occupational Therapy

## 2013-03-07 ENCOUNTER — Encounter: Payer: Medicaid Other | Admitting: Occupational Therapy

## 2013-03-09 ENCOUNTER — Ambulatory Visit: Payer: No Typology Code available for payment source | Attending: Orthopedic Surgery | Admitting: Occupational Therapy

## 2013-03-09 DIAGNOSIS — M25549 Pain in joints of unspecified hand: Secondary | ICD-10-CM | POA: Insufficient documentation

## 2013-03-09 DIAGNOSIS — IMO0001 Reserved for inherently not codable concepts without codable children: Secondary | ICD-10-CM | POA: Insufficient documentation

## 2013-03-09 DIAGNOSIS — M6281 Muscle weakness (generalized): Secondary | ICD-10-CM | POA: Insufficient documentation

## 2013-03-14 ENCOUNTER — Encounter: Payer: Medicaid Other | Admitting: Occupational Therapy

## 2013-03-16 ENCOUNTER — Encounter: Payer: Medicaid Other | Admitting: Occupational Therapy

## 2013-03-21 ENCOUNTER — Encounter: Payer: Medicaid Other | Admitting: Occupational Therapy

## 2013-03-22 ENCOUNTER — Other Ambulatory Visit: Payer: Self-pay | Admitting: Orthopedic Surgery

## 2013-03-22 DIAGNOSIS — M25531 Pain in right wrist: Secondary | ICD-10-CM

## 2013-03-23 ENCOUNTER — Encounter: Payer: Medicaid Other | Admitting: Occupational Therapy

## 2013-03-31 ENCOUNTER — Encounter (HOSPITAL_BASED_OUTPATIENT_CLINIC_OR_DEPARTMENT_OTHER): Payer: Self-pay | Admitting: *Deleted

## 2013-03-31 ENCOUNTER — Emergency Department (HOSPITAL_BASED_OUTPATIENT_CLINIC_OR_DEPARTMENT_OTHER)
Admission: EM | Admit: 2013-03-31 | Discharge: 2013-03-31 | Disposition: A | Discharge status: Home / Self Care | Payer: Medicaid Other | Attending: Emergency Medicine | Admitting: Emergency Medicine

## 2013-03-31 DIAGNOSIS — H6592 Unspecified nonsuppurative otitis media, left ear: Secondary | ICD-10-CM

## 2013-03-31 DIAGNOSIS — R509 Fever, unspecified: Secondary | ICD-10-CM | POA: Insufficient documentation

## 2013-03-31 DIAGNOSIS — Z79899 Other long term (current) drug therapy: Secondary | ICD-10-CM | POA: Insufficient documentation

## 2013-03-31 DIAGNOSIS — G8929 Other chronic pain: Secondary | ICD-10-CM | POA: Insufficient documentation

## 2013-03-31 DIAGNOSIS — H6690 Otitis media, unspecified, unspecified ear: Secondary | ICD-10-CM | POA: Insufficient documentation

## 2013-03-31 DIAGNOSIS — R42 Dizziness and giddiness: Secondary | ICD-10-CM | POA: Insufficient documentation

## 2013-03-31 DIAGNOSIS — Z981 Arthrodesis status: Secondary | ICD-10-CM | POA: Insufficient documentation

## 2013-03-31 DIAGNOSIS — Z9889 Other specified postprocedural states: Secondary | ICD-10-CM | POA: Insufficient documentation

## 2013-03-31 DIAGNOSIS — M549 Dorsalgia, unspecified: Secondary | ICD-10-CM | POA: Insufficient documentation

## 2013-03-31 DIAGNOSIS — J3489 Other specified disorders of nose and nasal sinuses: Secondary | ICD-10-CM | POA: Insufficient documentation

## 2013-03-31 MED ORDER — AMOXICILLIN 500 MG PO CAPS: 500.0000 mg | ORAL_CAPSULE | Freq: Three times a day (TID) | ORAL | Status: DC

## 2013-03-31 NOTE — ED Notes (Signed)
States she thinks a fly went in her left ear 6 days ago. Her mom put some garlic oil in her ear. Now she is having ringing in her ear.

## 2013-03-31 NOTE — ED Provider Notes (Signed)
History     CSN: [id number]  Arrival date & time 03/31/13  1127   First MD Initiated Contact with Patient 03/31/13 1238      Chief Complaint  Patient presents with  . Ear Fullness    (Consider location/radiation/quality/duration/timing/severity/associated sxs/prior treatment) Patient is a 47 y.o. female presenting with plugged ear sensation. The history is provided by the patient. No language interpreter was used.  Ear Fullness This is a new problem. The current episode started in the past 7 days. The problem occurs constantly. The problem has been gradually worsening. Associated symptoms include congestion, a fever and vertigo. Pertinent negatives include no nausea, visual change or vomiting. She has tried nothing for the symptoms.    Past Medical History  Diagnosis Date  . Back pain, chronic     Past Surgical History  Procedure Laterality Date  . Back surgery    . Tubal ligation    . Knee arthroscopy    . Back fusion      No family history on file.  History  Substance Use Topics  . Smoking status: Never Smoker   . Smokeless tobacco: Not on file  . Alcohol Use: No    OB History   Grav Para Term Preterm Abortions TAB SAB Ect Mult Living                  Review of Systems  Constitutional: Positive for fever.  HENT: Positive for congestion.   Gastrointestinal: Negative for nausea and vomiting.  Neurological: Positive for vertigo.  All other systems reviewed and are negative.    Allergies  Review of patient's allergies indicates no known allergies.  Home Medications   Current Outpatient Rx  Name  Route  Sig  Dispense  Refill  . amoxicillin (AMOXIL) 500 MG capsule   Oral   Take 1 capsule (500 mg total) by mouth 3 (three) times daily.   21 capsule   0   . celecoxib (CELEBREX) 200 MG capsule   Oral   Take 200 mg by mouth daily.           . cyclobenzaprine (FLEXERIL) 10 MG tablet   Oral   Take 10 mg by mouth 3 (three) times daily as needed. For  muscle spasms          . DULoxetine (CYMBALTA) 30 MG capsule   Oral   Take 30 mg by mouth daily.         Marland Kitchen gabapentin (NEURONTIN) 300 MG capsule   Oral   Take 300 mg by mouth 2 (two) times daily.             BP 111/63  Pulse 76  Temp(Src) 98 F (36.7 C) (Oral)  Resp 20  Wt 230 lb (104.327 kg)  BMI 37.14 kg/m2  SpO2 97%  Physical Exam  Nursing note and vitals reviewed. Constitutional: She is oriented to person, place, and time. She appears well-developed and well-nourished.  HENT:  Left Ear: Tympanic membrane is bulging. A middle ear effusion is present.  Mouth/Throat: No oropharyngeal exudate.  Eyes: Conjunctivae and EOM are normal. Pupils are equal, round, and reactive to light. Right eye exhibits no nystagmus. Left eye exhibits no nystagmus.  Neck: Normal range of motion.  Cardiovascular: Normal rate and regular rhythm.   Pulmonary/Chest: Effort normal and breath sounds normal.  Abdominal: Soft.  Musculoskeletal: Normal range of motion.  Lymphadenopathy:    She has no cervical adenopathy.  Neurological: She is alert and oriented to person,  place, and time.  Skin: Skin is warm and dry. No rash noted.  Psychiatric: She has a normal mood and affect. Her behavior is normal. Judgment and thought content normal.    ED Course  Procedures (including critical care time)  Labs Reviewed - No data to display No results found.   1. Otitis media with effusion, left       MDM          Jimmye Norman, NP 03/31/13 1438

## 2013-04-03 NOTE — ED Provider Notes (Signed)
Medical screening examination/treatment/procedure(s) were performed by non-physician practitioner and as supervising physician I was immediately available for consultation/collaboration.   Gavin Pound. Ameya Vowell, MD 04/03/13 1558

## 2013-04-05 ENCOUNTER — Ambulatory Visit
Admission: RE | Admit: 2013-04-05 | Discharge: 2013-04-05 | Disposition: A | Discharge status: Home / Self Care | Payer: Medicaid Other | Source: Ambulatory Visit | Attending: Orthopedic Surgery | Admitting: Orthopedic Surgery

## 2013-04-05 DIAGNOSIS — M25531 Pain in right wrist: Secondary | ICD-10-CM

## 2013-04-05 MED ORDER — IOHEXOL 180 MG/ML  SOLN
3.0000 mL | Freq: Once | INTRAMUSCULAR | Status: AC | PRN
  Administered 2013-04-05: 3 mL via INTRA_ARTICULAR

## 2013-10-11 ENCOUNTER — Other Ambulatory Visit: Payer: Self-pay | Admitting: Orthopedic Surgery

## 2013-11-07 ENCOUNTER — Encounter (HOSPITAL_COMMUNITY): Payer: Self-pay | Admitting: Pharmacy Technician

## 2013-11-10 ENCOUNTER — Encounter (HOSPITAL_COMMUNITY)
Admission: RE | Admit: 2013-11-10 | Discharge: 2013-11-10 | Disposition: A | Discharge status: Home / Self Care | Payer: Medicaid Other | Source: Ambulatory Visit | Attending: Orthopedic Surgery | Admitting: Orthopedic Surgery

## 2013-11-10 ENCOUNTER — Encounter (HOSPITAL_COMMUNITY): Payer: Self-pay

## 2013-11-10 DIAGNOSIS — Z01810 Encounter for preprocedural cardiovascular examination: Secondary | ICD-10-CM | POA: Insufficient documentation

## 2013-11-10 DIAGNOSIS — Z01812 Encounter for preprocedural laboratory examination: Secondary | ICD-10-CM | POA: Insufficient documentation

## 2013-11-10 DIAGNOSIS — Z01818 Encounter for other preprocedural examination: Secondary | ICD-10-CM | POA: Insufficient documentation

## 2013-11-10 HISTORY — DX: Anxiety disorder, unspecified: F41.9

## 2013-11-10 HISTORY — DX: Depression, unspecified: F32.A

## 2013-11-10 HISTORY — DX: Major depressive disorder, single episode, unspecified: F32.9

## 2013-11-10 LAB — COMPREHENSIVE METABOLIC PANEL
ALT: 19 U/L (ref 0–35)
AST: 20 U/L (ref 0–37)
Albumin: 3.6 g/dL (ref 3.5–5.2)
Alkaline Phosphatase: 107 U/L (ref 39–117)
BUN: 10 mg/dL (ref 6–23)
CALCIUM: 9.3 mg/dL (ref 8.4–10.5)
CO2: 24 mEq/L (ref 19–32)
Chloride: 103 mEq/L (ref 96–112)
Creatinine, Ser: 0.87 mg/dL (ref 0.50–1.10)
GFR, EST AFRICAN AMERICAN: 90 mL/min — AB (ref >90)
GFR, EST NON AFRICAN AMERICAN: 78 mL/min — AB (ref >90)
GLUCOSE: 84 mg/dL (ref 70–99)
Potassium: 4.1 mEq/L (ref 3.7–5.3)
SODIUM: 141 meq/L (ref 137–147)
TOTAL PROTEIN: 8.1 g/dL (ref 6.0–8.3)
Total Bilirubin: 0.6 mg/dL (ref 0.3–1.2)

## 2013-11-10 LAB — CBC WITH DIFFERENTIAL/PLATELET
BASOS ABS: 0 10*3/uL (ref 0.0–0.1)
Basophils Relative: 0 % (ref 0–1)
EOS ABS: 0.1 10*3/uL (ref 0.0–0.7)
EOS PCT: 2 % (ref 0–5)
HEMATOCRIT: 37.5 % (ref 36.0–46.0)
Hemoglobin: 12.2 g/dL (ref 12.0–15.0)
Lymphocytes Relative: 29 % (ref 12–46)
Lymphs Abs: 2.1 10*3/uL (ref 0.7–4.0)
MCH: 26.9 pg (ref 26.0–34.0)
MCHC: 32.5 g/dL (ref 30.0–36.0)
MCV: 82.8 fL (ref 78.0–100.0)
MONO ABS: 0.5 10*3/uL (ref 0.1–1.0)
Monocytes Relative: 7 % (ref 3–12)
Neutro Abs: 4.5 10*3/uL (ref 1.7–7.7)
Neutrophils Relative %: 62 % (ref 43–77)
Platelets: 260 10*3/uL (ref 150–400)
RBC: 4.53 MIL/uL (ref 3.87–5.11)
RDW: 14.9 % (ref 11.5–15.5)
WBC: 7.3 10*3/uL (ref 4.0–10.5)

## 2013-11-10 LAB — TYPE AND SCREEN
ABO/RH(D): O POS
Antibody Screen: NEGATIVE

## 2013-11-10 LAB — SURGICAL PCR SCREEN
MRSA, PCR: NEGATIVE
Staphylococcus aureus: POSITIVE — AB

## 2013-11-10 LAB — URINALYSIS, ROUTINE W REFLEX MICROSCOPIC
Bilirubin Urine: NEGATIVE
GLUCOSE, UA: NEGATIVE mg/dL
HGB URINE DIPSTICK: NEGATIVE
KETONES UR: NEGATIVE mg/dL
Leukocytes, UA: NEGATIVE
Nitrite: NEGATIVE
PROTEIN: NEGATIVE mg/dL
Specific Gravity, Urine: 1.024 (ref 1.005–1.030)
UROBILINOGEN UA: 0.2 mg/dL (ref 0.0–1.0)
pH: 5.5 (ref 5.0–8.0)

## 2013-11-10 LAB — APTT: aPTT: 32 seconds (ref 24–37)

## 2013-11-10 LAB — PROTIME-INR
INR: 1.04 (ref 0.00–1.49)
Prothrombin Time: 13.4 seconds (ref 11.6–15.2)

## 2013-11-10 LAB — HCG, SERUM, QUALITATIVE: PREG SERUM: NEGATIVE

## 2013-11-10 NOTE — Pre-Procedure Instructions (Signed)
Robin Schmidt  11/10/2013   Your procedure is scheduled on:  11/22/13  Report to [facility]  2 * 3 at 11 AM.  Call this number if you have problems the morning of surgery: [phone]   Remember:   Do not eat food or drink liquids after midnight.   Take these medicines the morning of surgery with A SIP OF WATER: cymbalta,neurontin,flexeril   Do not wear jewelry, make-up or nail polish.  Do not wear lotions, powders, or perfumes. You may wear deodorant.  Do not shave 48 hours prior to surgery. Men may shave face and neck.  Do not bring valuables to the hospital.  [facility] is not responsible                  for any belongings or valuables.               Contacts, dentures or bridgework may not be worn into surgery.  Leave suitcase in the car. After surgery it may be brought to your room.  For patients admitted to the hospital, discharge time is determined by your                treatment team.               Patients discharged the day of surgery will not be allowed to drive  home.  Name and phone number of your driver:   Special Instructions: Shower using CHG 2 nights before surgery and the night before surgery.  If you shower the day of surgery use CHG.  Use special wash - you have one bottle of CHG for all showers.  You should use approximately 1/3 of the bottle for each shower.   Please read over the following fact sheets that you were given: Pain Booklet, Coughing and Deep Breathing and Surgical Site Infection Prevention

## 2013-11-21 MED ORDER — CEFAZOLIN SODIUM-DEXTROSE 2-3 GM-% IV SOLR
2.0000 g | INTRAVENOUS | Status: AC
  Administered 2013-11-22: 2 g via INTRAVENOUS
  Filled 2013-11-21: qty 50

## 2013-11-22 ENCOUNTER — Encounter (HOSPITAL_COMMUNITY): Payer: Medicare Other | Admitting: Certified Registered Nurse Anesthetist

## 2013-11-22 ENCOUNTER — Ambulatory Visit (HOSPITAL_COMMUNITY): Payer: Medicare Other | Admitting: Certified Registered Nurse Anesthetist

## 2013-11-22 ENCOUNTER — Encounter (HOSPITAL_COMMUNITY): Payer: Self-pay | Admitting: *Deleted

## 2013-11-22 ENCOUNTER — Inpatient Hospital Stay (HOSPITAL_COMMUNITY)
Admission: RE | Admit: 2013-11-22 | Discharge: 2013-11-23 | DRG: 473 | Disposition: A | Discharge status: Home / Self Care | Payer: Medicare Other | Source: Ambulatory Visit | Attending: Orthopedic Surgery | Admitting: Orthopedic Surgery

## 2013-11-22 ENCOUNTER — Encounter (HOSPITAL_COMMUNITY)
Admission: RE | Disposition: A | Discharge status: Home / Self Care | Payer: Self-pay | Source: Ambulatory Visit | Attending: Orthopedic Surgery

## 2013-11-22 ENCOUNTER — Ambulatory Visit (HOSPITAL_COMMUNITY): Payer: Medicare Other

## 2013-11-22 DIAGNOSIS — M5030 Other cervical disc degeneration, unspecified cervical region: Principal | ICD-10-CM | POA: Diagnosis present

## 2013-11-22 DIAGNOSIS — G8929 Other chronic pain: Secondary | ICD-10-CM | POA: Diagnosis present

## 2013-11-22 DIAGNOSIS — F411 Generalized anxiety disorder: Secondary | ICD-10-CM | POA: Diagnosis present

## 2013-11-22 DIAGNOSIS — F329 Major depressive disorder, single episode, unspecified: Secondary | ICD-10-CM | POA: Diagnosis present

## 2013-11-22 DIAGNOSIS — Z981 Arthrodesis status: Secondary | ICD-10-CM | POA: Diagnosis not present

## 2013-11-22 DIAGNOSIS — F3289 Other specified depressive episodes: Secondary | ICD-10-CM | POA: Diagnosis present

## 2013-11-22 DIAGNOSIS — M5410 Radiculopathy, site unspecified: Secondary | ICD-10-CM | POA: Diagnosis present

## 2013-11-22 DIAGNOSIS — M79609 Pain in unspecified limb: Secondary | ICD-10-CM | POA: Diagnosis present

## 2013-11-22 HISTORY — PX: ANTERIOR CERVICAL DECOMP/DISCECTOMY FUSION: SHX1161

## 2013-11-22 SURGERY — ANTERIOR CERVICAL DECOMPRESSION/DISCECTOMY FUSION 1 LEVEL
Anesthesia: General | Site: Spine Cervical

## 2013-11-22 MED ORDER — BUPIVACAINE-EPINEPHRINE (PF) 0.25% -1:200000 IJ SOLN
INTRAMUSCULAR | Status: AC
  Filled 2013-11-22: qty 30

## 2013-11-22 MED ORDER — 0.9 % SODIUM CHLORIDE (POUR BTL) OPTIME
TOPICAL | Status: DC | PRN
  Administered 2013-11-22: 1000 mL

## 2013-11-22 MED ORDER — ACETAMINOPHEN 650 MG RE SUPP: 650.0000 mg | RECTAL | Status: DC | PRN

## 2013-11-22 MED ORDER — FENTANYL CITRATE 0.05 MG/ML IJ SOLN
INTRAMUSCULAR | Status: DC | PRN
  Administered 2013-11-22: 50 ug via INTRAVENOUS
  Administered 2013-11-22: 100 ug via INTRAVENOUS
  Administered 2013-11-22: 50 ug via INTRAVENOUS

## 2013-11-22 MED ORDER — BUPIVACAINE-EPINEPHRINE 0.25% -1:200000 IJ SOLN
INTRAMUSCULAR | Status: DC | PRN
  Administered 2013-11-22: 3 mL

## 2013-11-22 MED ORDER — GLYCOPYRROLATE 0.2 MG/ML IJ SOLN
INTRAMUSCULAR | Status: DC | PRN
  Administered 2013-11-22: 0.6 mg via INTRAVENOUS

## 2013-11-22 MED ORDER — MORPHINE SULFATE 2 MG/ML IJ SOLN
1.0000 mg | INTRAMUSCULAR | Status: DC | PRN
  Administered 2013-11-22 – 2013-11-23 (×4): 2 mg via INTRAVENOUS
  Filled 2013-11-22 (×4): qty 1

## 2013-11-22 MED ORDER — SODIUM CHLORIDE 0.9 % IJ SOLN: 3.0000 mL | Freq: Two times a day (BID) | INTRAMUSCULAR | Status: DC

## 2013-11-22 MED ORDER — ALPRAZOLAM 0.25 MG PO TABS: 0.2500 mg | ORAL_TABLET | Freq: Three times a day (TID) | ORAL | Status: DC | PRN

## 2013-11-22 MED ORDER — NEOSTIGMINE METHYLSULFATE 1 MG/ML IJ SOLN
INTRAMUSCULAR | Status: DC | PRN
  Administered 2013-11-22: 4 mg via INTRAVENOUS

## 2013-11-22 MED ORDER — DIAZEPAM 5 MG PO TABS
5.0000 mg | ORAL_TABLET | Freq: Four times a day (QID) | ORAL | Status: DC | PRN
  Administered 2013-11-22 (×2): 5 mg via ORAL
  Filled 2013-11-22 (×2): qty 1

## 2013-11-22 MED ORDER — ALUM & MAG HYDROXIDE-SIMETH 200-200-20 MG/5ML PO SUSP: 30.0000 mL | Freq: Four times a day (QID) | ORAL | Status: DC | PRN

## 2013-11-22 MED ORDER — ACETAMINOPHEN 325 MG PO TABS: 650.0000 mg | ORAL_TABLET | ORAL | Status: DC | PRN

## 2013-11-22 MED ORDER — LACTATED RINGERS IV SOLN
INTRAVENOUS | Status: DC | PRN
  Administered 2013-11-22 (×2): via INTRAVENOUS

## 2013-11-22 MED ORDER — PROPOFOL 10 MG/ML IV BOLUS
INTRAVENOUS | Status: DC | PRN
  Administered 2013-11-22: 160 mg via INTRAVENOUS

## 2013-11-22 MED ORDER — SENNOSIDES-DOCUSATE SODIUM 8.6-50 MG PO TABS: 1.0000 | ORAL_TABLET | Freq: Every evening | ORAL | Status: DC | PRN

## 2013-11-22 MED ORDER — ONDANSETRON HCL 4 MG/2ML IJ SOLN
INTRAMUSCULAR | Status: DC | PRN
  Administered 2013-11-22: 4 mg via INTRAVENOUS

## 2013-11-22 MED ORDER — HYDROMORPHONE HCL PF 1 MG/ML IJ SOLN
INTRAMUSCULAR | Status: AC
  Administered 2013-11-22: 0.5 mg via INTRAVENOUS
  Filled 2013-11-22: qty 1

## 2013-11-22 MED ORDER — OXYCODONE HCL 5 MG/5ML PO SOLN: 5.0000 mg | Freq: Once | ORAL | Status: DC | PRN

## 2013-11-22 MED ORDER — MENTHOL 3 MG MT LOZG: 1.0000 | LOZENGE | OROMUCOSAL | Status: DC | PRN

## 2013-11-22 MED ORDER — ONDANSETRON HCL 4 MG/2ML IJ SOLN
4.0000 mg | INTRAMUSCULAR | Status: DC | PRN
Start: 2013-11-22 — End: 2013-11-23
  Administered 2013-11-22 (×2): 4 mg via INTRAVENOUS
  Filled 2013-11-22 (×2): qty 2

## 2013-11-22 MED ORDER — DOCUSATE SODIUM 100 MG PO CAPS
100.0000 mg | ORAL_CAPSULE | Freq: Two times a day (BID) | ORAL | Status: DC
  Administered 2013-11-22 – 2013-11-23 (×2): 100 mg via ORAL
  Filled 2013-11-22 (×3): qty 1

## 2013-11-22 MED ORDER — ZOLPIDEM TARTRATE 5 MG PO TABS: 5.0000 mg | ORAL_TABLET | Freq: Every evening | ORAL | Status: DC | PRN

## 2013-11-22 MED ORDER — OXYCODONE-ACETAMINOPHEN 5-325 MG PO TABS
1.0000 | ORAL_TABLET | ORAL | Status: DC | PRN
  Filled 2013-11-22: qty 2

## 2013-11-22 MED ORDER — LIDOCAINE HCL (CARDIAC) 20 MG/ML IV SOLN
INTRAVENOUS | Status: DC | PRN
  Administered 2013-11-22: 80 mg via INTRAVENOUS

## 2013-11-22 MED ORDER — PHENOL 1.4 % MT LIQD
1.0000 | OROMUCOSAL | Status: DC | PRN
  Administered 2013-11-22: 1 via OROMUCOSAL
  Filled 2013-11-22: qty 177

## 2013-11-22 MED ORDER — MIDAZOLAM HCL 5 MG/5ML IJ SOLN
INTRAMUSCULAR | Status: DC | PRN
  Administered 2013-11-22: 2 mg via INTRAVENOUS

## 2013-11-22 MED ORDER — THROMBIN 20000 UNITS EX SOLR
CUTANEOUS | Status: DC | PRN
  Administered 2013-11-22: 09:00:00 via TOPICAL

## 2013-11-22 MED ORDER — SODIUM CHLORIDE 0.9 % IV SOLN: 250.0000 mL | INTRAVENOUS | Status: DC

## 2013-11-22 MED ORDER — PROMETHAZINE HCL 25 MG/ML IJ SOLN: 6.2500 mg | INTRAMUSCULAR | Status: DC | PRN

## 2013-11-22 MED ORDER — BISACODYL 5 MG PO TBEC: 5.0000 mg | DELAYED_RELEASE_TABLET | Freq: Every day | ORAL | Status: DC | PRN

## 2013-11-22 MED ORDER — OXYCODONE HCL 5 MG PO TABS: 5.0000 mg | ORAL_TABLET | Freq: Once | ORAL | Status: DC | PRN

## 2013-11-22 MED ORDER — ROCURONIUM BROMIDE 100 MG/10ML IV SOLN
INTRAVENOUS | Status: DC | PRN
  Administered 2013-11-22: 10 mg via INTRAVENOUS
  Administered 2013-11-22: 50 mg via INTRAVENOUS

## 2013-11-22 MED ORDER — CEFAZOLIN SODIUM 1-5 GM-% IV SOLN
1.0000 g | Freq: Three times a day (TID) | INTRAVENOUS | Status: AC
  Administered 2013-11-22 – 2013-11-23 (×2): 1 g via INTRAVENOUS
  Filled 2013-11-22 (×2): qty 50

## 2013-11-22 MED ORDER — SODIUM CHLORIDE 0.9 % IJ SOLN: 3.0000 mL | INTRAMUSCULAR | Status: DC | PRN

## 2013-11-22 MED ORDER — THROMBIN 20000 UNITS EX SOLR
CUTANEOUS | Status: AC
  Filled 2013-11-22: qty 20000

## 2013-11-22 MED ORDER — HYDROMORPHONE HCL PF 1 MG/ML IJ SOLN
0.2500 mg | INTRAMUSCULAR | Status: DC | PRN
  Administered 2013-11-22 (×4): 0.5 mg via INTRAVENOUS

## 2013-11-22 MED ORDER — FLEET ENEMA 7-19 GM/118ML RE ENEM: 1.0000 | ENEMA | Freq: Once | RECTAL | Status: AC | PRN

## 2013-11-22 MED ORDER — DULOXETINE HCL 30 MG PO CPEP
30.0000 mg | ORAL_CAPSULE | Freq: Every day | ORAL | Status: DC
  Administered 2013-11-22: 30 mg via ORAL
  Filled 2013-11-22 (×2): qty 1

## 2013-11-22 MED ORDER — GABAPENTIN 300 MG PO CAPS
300.0000 mg | ORAL_CAPSULE | Freq: Two times a day (BID) | ORAL | Status: DC
  Administered 2013-11-22: 300 mg via ORAL
  Filled 2013-11-22 (×4): qty 1

## 2013-11-22 SURGICAL SUPPLY — 75 items
BENZOIN TINCTURE PRP APPL 2/3 (GAUZE/BANDAGES/DRESSINGS) ×2 IMPLANT
BIT DRILL NEURO 2X3.1 SFT TUCH (MISCELLANEOUS) ×1 IMPLANT
BIT DRILL SKYLINE 12MM (BIT) ×1 IMPLANT
BLADE SURG 15 STRL LF DISP TIS (BLADE) ×1 IMPLANT
BLADE SURG 15 STRL SS (BLADE) ×1
BLADE SURG ROTATE 9660 (MISCELLANEOUS) ×2 IMPLANT
BUR MATCHSTICK NEURO 3.0 LAGG (BURR) ×2 IMPLANT
CARTRIDGE OIL MAESTRO DRILL (MISCELLANEOUS) ×1 IMPLANT
CLOTH BEACON ORANGE TIMEOUT ST (SAFETY) ×2 IMPLANT
CLSR STERI-STRIP ANTIMIC 1/2X4 (GAUZE/BANDAGES/DRESSINGS) ×2 IMPLANT
COLLAR CERV LO CONTOUR FIRM DE (SOFTGOODS) ×2 IMPLANT
CORDS BIPOLAR (ELECTRODE) ×2 IMPLANT
COVER SURGICAL LIGHT HANDLE (MISCELLANEOUS) ×2 IMPLANT
CRADLE DONUT ADULT HEAD (MISCELLANEOUS) ×2 IMPLANT
DIFFUSER DRILL AIR PNEUMATIC (MISCELLANEOUS) ×2 IMPLANT
DRAIN JACKSON RD 7FR 3/32 (WOUND CARE) IMPLANT
DRAPE C-ARM 42X72 X-RAY (DRAPES) ×2 IMPLANT
DRAPE POUCH INSTRU U-SHP 10X18 (DRAPES) ×2 IMPLANT
DRAPE SURG 17X23 STRL (DRAPES) ×8 IMPLANT
DRILL BIT SKYLINE 12MM (BIT) ×1
DRILL NEURO 2X3.1 SOFT TOUCH (MISCELLANEOUS) ×2
DURAPREP 26ML APPLICATOR (WOUND CARE) ×2 IMPLANT
ELECT COATED BLADE 2.86 ST (ELECTRODE) ×2 IMPLANT
ELECT REM PT RETURN 9FT ADLT (ELECTROSURGICAL) ×2
ELECTRODE REM PT RTRN 9FT ADLT (ELECTROSURGICAL) ×1 IMPLANT
EVACUATOR SILICONE 100CC (DRAIN) IMPLANT
GAUZE SPONGE 4X4 16PLY XRAY LF (GAUZE/BANDAGES/DRESSINGS) ×2 IMPLANT
GLOVE BIO SURGEON STRL SZ7 (GLOVE) ×2 IMPLANT
GLOVE BIO SURGEON STRL SZ8 (GLOVE) ×2 IMPLANT
GLOVE BIOGEL PI IND STRL 7.0 (GLOVE) ×2 IMPLANT
GLOVE BIOGEL PI IND STRL 8 (GLOVE) ×1 IMPLANT
GLOVE BIOGEL PI INDICATOR 7.0 (GLOVE) ×2
GLOVE BIOGEL PI INDICATOR 8 (GLOVE) ×1
GOWN SRG XL XLNG 56XLVL 4 (GOWN DISPOSABLE) ×1 IMPLANT
GOWN STRL NON-REIN LRG LVL3 (GOWN DISPOSABLE) ×2 IMPLANT
GOWN STRL NON-REIN XL XLG LVL4 (GOWN DISPOSABLE) ×1
IMPL S ENDOSKEL TC 8MM ODEG (Orthopedic Implant) ×1 IMPLANT
IMPLANT S ENDOSKEL TC 8MM ODEG (Orthopedic Implant) ×2 IMPLANT
IV CATH 14GX2 1/4 (CATHETERS) ×2 IMPLANT
KIT BASIN OR (CUSTOM PROCEDURE TRAY) ×2 IMPLANT
KIT ROOM TURNOVER OR (KITS) ×2 IMPLANT
MANIFOLD NEPTUNE II (INSTRUMENTS) ×2 IMPLANT
NEEDLE 27GAX1X1/2 (NEEDLE) ×2 IMPLANT
NEEDLE SPNL 20GX3.5 QUINCKE YW (NEEDLE) ×2 IMPLANT
NS IRRIG 1000ML POUR BTL (IV SOLUTION) ×2 IMPLANT
OIL CARTRIDGE MAESTRO DRILL (MISCELLANEOUS) ×2
PACK ORTHO CERVICAL (CUSTOM PROCEDURE TRAY) ×2 IMPLANT
PAD ARMBOARD 7.5X6 YLW CONV (MISCELLANEOUS) ×4 IMPLANT
PATTIES SURGICAL .5 X.5 (GAUZE/BANDAGES/DRESSINGS) ×2 IMPLANT
PATTIES SURGICAL .5 X1 (DISPOSABLE) IMPLANT
PIN DISTRACTION 12MM (PIN) ×2
PIN DSTRCT 12XNS SS ACIS (PIN) ×2 IMPLANT
PLATE SKYLINE 12MM (Plate) ×2 IMPLANT
PUTTY BONE DBX 2.5 MIS (Bone Implant) ×2 IMPLANT
SCREW SKYLINE VARIABLE LG (Screw) ×4 IMPLANT
SCREW VARIABLE SELF TAP 12MM (Screw) ×8 IMPLANT
SPONGE GAUZE 4X4 12PLY (GAUZE/BANDAGES/DRESSINGS) ×2 IMPLANT
SPONGE GAUZE 4X4 12PLY STER LF (GAUZE/BANDAGES/DRESSINGS) ×2 IMPLANT
SPONGE INTESTINAL PEANUT (DISPOSABLE) ×2 IMPLANT
SPONGE SURGIFOAM ABS GEL 100 (HEMOSTASIS) ×2 IMPLANT
STRIP CLOSURE SKIN 1/2X4 (GAUZE/BANDAGES/DRESSINGS) ×2 IMPLANT
SURGIFLO TRUKIT (HEMOSTASIS) IMPLANT
SUT MNCRL AB 4-0 PS2 18 (SUTURE) IMPLANT
SUT SILK 4 0 (SUTURE)
SUT SILK 4-0 18XBRD TIE 12 (SUTURE) IMPLANT
SUT VIC AB 2-0 CT2 18 VCP726D (SUTURE) ×2 IMPLANT
SYR BULB IRRIGATION 50ML (SYRINGE) ×2 IMPLANT
SYR CONTROL 10ML LL (SYRINGE) ×4 IMPLANT
TAPE CLOTH 4X10 WHT NS (GAUZE/BANDAGES/DRESSINGS) ×2 IMPLANT
TAPE CLOTH SURG 4X10 WHT LF (GAUZE/BANDAGES/DRESSINGS) ×2 IMPLANT
TAPE UMBILICAL COTTON 1/8X30 (MISCELLANEOUS) ×2 IMPLANT
TOWEL OR 17X24 6PK STRL BLUE (TOWEL DISPOSABLE) ×2 IMPLANT
TOWEL OR 17X26 10 PK STRL BLUE (TOWEL DISPOSABLE) ×2 IMPLANT
WATER STERILE IRR 1000ML POUR (IV SOLUTION) IMPLANT
YANKAUER SUCT BULB TIP NO VENT (SUCTIONS) ×2 IMPLANT

## 2013-11-22 NOTE — Anesthesia Postprocedure Evaluation (Signed)
  Anesthesia Post-op Note  Patient: Robin Schmidt  Procedure(s) Performed: Procedure(s) with comments: ANTERIOR CERVICAL DECOMPRESSION/DISCECTOMY FUSION 1 LEVEL (N/A) - Anterior cervical decompression fusion, cervical 5-6 with instrumentation and allograft  Patient Location: PACU  Anesthesia Type:General  Level of Consciousness: awake and alert   Airway and Oxygen Therapy: Patient Spontanous Breathing  Post-op Pain: mild  Post-op Assessment: Post-op Vital signs reviewed  Post-op Vital Signs: stable  Complications: No apparent anesthesia complications

## 2013-11-22 NOTE — Transfer of Care (Signed)
Immediate Anesthesia Transfer of Care Note  Patient: Robin Schmidt  Procedure(s) Performed: Procedure(s) with comments: ANTERIOR CERVICAL DECOMPRESSION/DISCECTOMY FUSION 1 LEVEL (N/A) - Anterior cervical decompression fusion, cervical 5-6 with instrumentation and allograft  Patient Location: PACU  Anesthesia Type:General  Level of Consciousness: awake, alert , oriented and patient cooperative  Airway & Oxygen Therapy: Patient Spontanous Breathing and Patient connected to nasal cannula oxygen  Post-op Assessment: Report given to PACU RN, Post -op Vital signs reviewed and stable and Patient moving all extremities X 4  Post vital signs: Reviewed and stable  Complications: No apparent anesthesia complications

## 2013-11-22 NOTE — Progress Notes (Signed)
Orthopedic Tech Progress Note Patient Details:  ZAYLEY ARRAS 10/30/1966 [id number] Patient has soft collar Patient ID: Junius Creamer, female   DOB: [birth year], 48 y.o.   MRN: [id number]   Braulio Bosch 11/22/2013, 3:40 PM

## 2013-11-22 NOTE — Preoperative (Signed)
Beta Blockers   Reason not to administer Beta Blockers:Not Applicable 

## 2013-11-22 NOTE — Anesthesia Procedure Notes (Signed)
Procedure Name: Intubation Date/Time: 11/22/2013 8:36 AM Performed by: Carney Living Pre-anesthesia Checklist: Patient identified, Emergency Drugs available, Suction available, Patient being monitored and Timeout performed Patient Re-evaluated:Patient Re-evaluated prior to inductionOxygen Delivery Method: Circle system utilized Preoxygenation: Pre-oxygenation with 100% oxygen Intubation Type: IV induction Ventilation: Mask ventilation without difficulty Laryngoscope Size: Mac and 4 Grade View: Grade II Tube type: Oral Tube size: 7.5 mm Number of attempts: 1 Airway Equipment and Method: Stylet Placement Confirmation: ETT inserted through vocal cords under direct vision,  positive ETCO2 and breath sounds checked- equal and bilateral Secured at: 21 cm Tube secured with: Tape Dental Injury: Teeth and Oropharynx as per pre-operative assessment

## 2013-11-22 NOTE — Progress Notes (Signed)
Report rec'd from [place] for lunch relief

## 2013-11-22 NOTE — H&P (Signed)
PREOPERATIVE H&P  Chief Complaint: R arm pain  HPI: Robin Schmidt is a 48 y.o. female who presents with ongoing right arm pain. 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 Patient continues to be in severe pain.   Past Medical History  Diagnosis Date  . Back pain, chronic   . Anxiety   . Depression    Past Surgical History  Procedure Laterality Date  . Back surgery    . Tubal ligation    . Knee arthroscopy    . Back fusion     History   Social History  . Marital Status: Single    Spouse Name: N/A    Number of Children: N/A  . Years of Education: N/A   Social History Main Topics  . Smoking status: Never Smoker   . Smokeless tobacco: None  . Alcohol Use: No  . Drug Use: No  . Sexual Activity: No   Other Topics Concern  . None   Social History Narrative  . None   History reviewed. No pertinent family history. No Known Allergies Prior to Admission medications   Medication Sig Start Date End Date Taking? Authorizing Provider  celecoxib (CELEBREX) 200 MG capsule Take 200 mg by mouth daily.     Yes Historical Provider, MD  cyclobenzaprine (FLEXERIL) 10 MG tablet Take 10 mg by mouth 3 (three) times daily as needed. For muscle spasms    Yes Historical Provider, MD  DULoxetine (CYMBALTA) 30 MG capsule Take 30 mg by mouth daily.   Yes Historical Provider, MD  gabapentin (NEURONTIN) 300 MG capsule Take 300 mg by mouth 2 (two) times daily.     Yes Historical Provider, MD     All other systems have been reviewed and were otherwise negative with the exception of those mentioned in the HPI and as above.  Physical Exam: Filed Vitals:   11/22/13 0633  BP: 139/74  Pulse: 79  Temp: 98.6 F (37 C)  Resp: 20    General: Alert, no acute distress Cardiovascular: No pedal edema Respiratory: No cyanosis, no use of accessory musculature GI: No organomegaly, abdomen is soft and non-tender Skin: No lesions in the area of chief complaint Neurologic: Sensation intact  distally Psychiatric: Patient is competent for consent with normal mood and affect Lymphatic: No axillary or cervical lymphadenopathy  MUSCULOSKELETAL: + spurling on right  Assessment/Plan: Right arm pain Plan for Procedure(s): ANTERIOR CERVICAL DECOMPRESSION/DISCECTOMY FUSION 1 LEVEL   Sinclair Ship, MD 11/22/2013 7:37 AM

## 2013-11-22 NOTE — Anesthesia Preprocedure Evaluation (Addendum)
Anesthesia Evaluation  Patient identified by MRN, date of birth, ID band Patient awake    Reviewed: Allergy & Precautions, H&P , NPO status , Patient's Chart, lab work & pertinent test results  Airway Mallampati: II TM Distance: >3 FB Neck ROM: Full    Dental  (+) Teeth Intact and Dental Advisory Given   Pulmonary  breath sounds clear to auscultation        Cardiovascular Rhythm:Regular     Neuro/Psych Anxiety Depression    GI/Hepatic   Endo/Other  Morbid obesity  Renal/GU      Musculoskeletal   Abdominal   Peds  Hematology   Anesthesia Other Findings   Reproductive/Obstetrics                         Anesthesia Physical Anesthesia Plan  ASA: II  Anesthesia Plan: General   Post-op Pain Management:    Induction: Intravenous  Airway Management Planned: Oral ETT  Additional Equipment:   Intra-op Plan:   Post-operative Plan: Extubation in OR  Informed Consent:   Dental advisory given  Plan Discussed with: CRNA, Surgeon and Anesthesiologist  Anesthesia Plan Comments:       Anesthesia Quick Evaluation

## 2013-11-23 ENCOUNTER — Encounter (HOSPITAL_COMMUNITY): Payer: Self-pay | Admitting: Orthopedic Surgery

## 2013-11-23 NOTE — Progress Notes (Signed)
Patient provided with discharge instructions and follow up information. She was educated on signs & symptoms of infection and precautions to take while in her cervical collar. She is going home in a private vehicle with family and friend support.

## 2013-11-23 NOTE — Plan of Care (Signed)
Problem: Consults Goal: Diagnosis - Spinal Surgery Cervical Spine Fusion C5-C6

## 2013-11-23 NOTE — Progress Notes (Signed)
Patient s/p C5/6 ACDF doing well. Reports substantial improvement in right arm pain.  BP 124/70  Pulse 90  Temp(Src) 97.9 F (36.6 C) (Oral)  Resp 18  SpO2 96%  Collar fitting appropriately. NVI  S/p 5/6 ACDF, doing well  - soft collar at all times - d/c home today, f/u 2 weeks
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
## 2013-12-05 NOTE — Discharge Summary (Signed)
Patient ID: Robin Schmidt MRN: [id number] DOB/AGE: [birth year] 48 y.o.  Admit date: 11/22/2013 Discharge date: 11/24/2013  Admission Diagnoses:  Active Problems:   Radiculopathy   Discharge Diagnoses:  Same  Past Medical History  Diagnosis Date  . Back pain, chronic   . Anxiety   . Depression     Surgeries: Procedure(s): ANTERIOR CERVICAL DECOMPRESSION/DISCECTOMY FUSION 1 LEVEL C5-6 on 11/22/2013   Consultants:    Discharged Condition: Improved  Hospital Course: Robin Schmidt is an 48 y.o. female who was admitted 11/22/2013 for operative treatment of cervical radiculopathy. Patient has severe unremitting pain that affects sleep, daily activities, and work/hobbies. After pre-op clearance the patient was taken to the operating room on 11/22/2013 and underwent  Procedure(s): ANTERIOR CERVICAL DECOMPRESSION/DISCECTOMY FUSION 1 LEVEL C5-6.    Patient was given perioperative antibiotics:  Anti-infectives   Start     Dose/Rate Route Frequency Ordered Stop   11/22/13 1600  ceFAZolin (ANCEF) IVPB 1 g/50 mL premix     1 g 100 mL/hr over 30 Minutes Intravenous Every 8 hours 11/22/13 1219 11/23/13 0034   11/22/13 0600  ceFAZolin (ANCEF) IVPB 2 g/50 mL premix     2 g 100 mL/hr over 30 Minutes Intravenous On call to O.R. 11/21/13 1351 11/22/13 0847       Patient was given sequential compression devices, early ambulation to prevent DVT.  Patient benefited maximally from hospital stay and there were no complications.    Recent vital signs: BP 124/70  Pulse 90  Temp(Src) 97.9 F (36.6 C) (Oral)  Resp 18  SpO2 96%   Discharge Medications:     Medication List    STOP taking these medications       celecoxib 200 MG capsule  Commonly known as:  CELEBREX     cyclobenzaprine 10 MG tablet  Commonly known as:  FLEXERIL      TAKE these medications       DULoxetine 30 MG capsule  Commonly known as:  CYMBALTA  Take 30 mg by mouth daily.     gabapentin 300 MG capsule   Commonly known as:  NEURONTIN  Take 300 mg by mouth 2 (two) times daily.        Diagnostic Studies: Dg Chest 2 View  11/10/2013   CLINICAL DATA:  Preoperative evaluation for cervical surgery  EXAM: CHEST  2 VIEW  COMPARISON:  None.  FINDINGS: The heart size and mediastinal contours are within normal limits. Both lungs are clear. The visualized skeletal structures are unremarkable.  IMPRESSION: No active cardiopulmonary disease.   Electronically Signed   By: Inez Catalina M.D.   On: 11/10/2013 14:50   Dg Cervical Spine 1 View  11/22/2013   CLINICAL DATA:  Cervical disc protrusion.  EXAM: DG CERVICAL SPINE - 1 VIEW; DG C-ARM 1-60 MIN  COMPARISON:  MRI dated 10/03/2013  FINDINGS: Single lateral C-arm image demonstrates the patient has undergone anterior cervical fusion at C5-6. The hardware appears in good position. Alignment is anatomic.  IMPRESSION: Anterior cervical fusion performed at C5-6.   Electronically Signed   By: Rozetta Nunnery M.D.   On: 11/22/2013 10:46   Dg C-arm 1-60 Min  11/22/2013   CLINICAL DATA:  Cervical disc protrusion.  EXAM: DG CERVICAL SPINE - 1 VIEW; DG C-ARM 1-60 MIN  COMPARISON:  MRI dated 10/03/2013  FINDINGS: Single lateral C-arm image demonstrates the patient has undergone anterior cervical fusion at C5-6. The hardware appears in good position. Alignment is anatomic.  IMPRESSION: Anterior  cervical fusion performed at C5-6.   Electronically Signed   By: Rozetta Nunnery M.D.   On: 11/22/2013 10:46    Disposition: 01-Home or Self Care  1 Day S/p 5/6 ACDF, doing well, resolved R arm pain - soft collar at all times  - d/c home today - Signed scripts in chart for DC -D/C instructins printed and in chart -  f/u 2 weeks      Signed: Justice Britain 12/05/2013, 9:07 AM

## 2014-01-25 ENCOUNTER — Ambulatory Visit: Payer: Medicaid Other | Attending: Orthopedic Surgery | Admitting: Physical Therapy

## 2014-01-25 DIAGNOSIS — M542 Cervicalgia: Secondary | ICD-10-CM | POA: Diagnosis not present

## 2014-01-25 DIAGNOSIS — IMO0001 Reserved for inherently not codable concepts without codable children: Secondary | ICD-10-CM | POA: Insufficient documentation

## 2014-02-02 ENCOUNTER — Ambulatory Visit: Payer: Medicaid Other | Admitting: Physical Therapy

## 2014-02-02 DIAGNOSIS — IMO0001 Reserved for inherently not codable concepts without codable children: Secondary | ICD-10-CM | POA: Diagnosis not present

## 2014-02-12 ENCOUNTER — Ambulatory Visit: Payer: Medicaid Other | Attending: Orthopedic Surgery | Admitting: Physical Therapy

## 2014-02-12 DIAGNOSIS — M542 Cervicalgia: Secondary | ICD-10-CM | POA: Insufficient documentation

## 2014-02-12 DIAGNOSIS — IMO0001 Reserved for inherently not codable concepts without codable children: Secondary | ICD-10-CM | POA: Diagnosis present

## 2014-02-26 ENCOUNTER — Ambulatory Visit: Payer: Medicaid Other | Admitting: Physical Therapy

## 2014-02-28 ENCOUNTER — Ambulatory Visit: Payer: Medicaid Other | Admitting: Physical Therapy

## 2014-02-28 DIAGNOSIS — IMO0001 Reserved for inherently not codable concepts without codable children: Secondary | ICD-10-CM | POA: Diagnosis not present

## 2014-03-26 ENCOUNTER — Emergency Department (HOSPITAL_BASED_OUTPATIENT_CLINIC_OR_DEPARTMENT_OTHER)
Admission: EM | Admit: 2014-03-26 | Discharge: 2014-03-26 | Disposition: A | Discharge status: Home / Self Care | Payer: Medicare Other | Attending: Emergency Medicine | Admitting: Emergency Medicine

## 2014-03-26 ENCOUNTER — Emergency Department (HOSPITAL_BASED_OUTPATIENT_CLINIC_OR_DEPARTMENT_OTHER): Payer: Medicare Other

## 2014-03-26 ENCOUNTER — Encounter (HOSPITAL_BASED_OUTPATIENT_CLINIC_OR_DEPARTMENT_OTHER): Payer: Self-pay | Admitting: Emergency Medicine

## 2014-03-26 DIAGNOSIS — F3289 Other specified depressive episodes: Secondary | ICD-10-CM | POA: Insufficient documentation

## 2014-03-26 DIAGNOSIS — G8929 Other chronic pain: Secondary | ICD-10-CM | POA: Insufficient documentation

## 2014-03-26 DIAGNOSIS — Z8739 Personal history of other diseases of the musculoskeletal system and connective tissue: Secondary | ICD-10-CM | POA: Insufficient documentation

## 2014-03-26 DIAGNOSIS — F411 Generalized anxiety disorder: Secondary | ICD-10-CM | POA: Insufficient documentation

## 2014-03-26 DIAGNOSIS — Z79899 Other long term (current) drug therapy: Secondary | ICD-10-CM | POA: Insufficient documentation

## 2014-03-26 DIAGNOSIS — R197 Diarrhea, unspecified: Secondary | ICD-10-CM | POA: Insufficient documentation

## 2014-03-26 DIAGNOSIS — R42 Dizziness and giddiness: Secondary | ICD-10-CM | POA: Insufficient documentation

## 2014-03-26 DIAGNOSIS — F329 Major depressive disorder, single episode, unspecified: Secondary | ICD-10-CM | POA: Insufficient documentation

## 2014-03-26 LAB — CBC WITH DIFFERENTIAL/PLATELET
BASOS PCT: 1 % (ref 0–1)
Basophils Absolute: 0 10*3/uL (ref 0.0–0.1)
EOS ABS: 0.1 10*3/uL (ref 0.0–0.7)
Eosinophils Relative: 2 % (ref 0–5)
HEMATOCRIT: 39.6 % (ref 36.0–46.0)
Hemoglobin: 13.1 g/dL (ref 12.0–15.0)
LYMPHS ABS: 1.3 10*3/uL (ref 0.7–4.0)
Lymphocytes Relative: 28 % (ref 12–46)
MCH: 28.1 pg (ref 26.0–34.0)
MCHC: 33.1 g/dL (ref 30.0–36.0)
MCV: 85 fL (ref 78.0–100.0)
MONO ABS: 0.4 10*3/uL (ref 0.1–1.0)
MONOS PCT: 8 % (ref 3–12)
NEUTROS PCT: 61 % (ref 43–77)
Neutro Abs: 2.9 10*3/uL (ref 1.7–7.7)
Platelets: 230 10*3/uL (ref 150–400)
RBC: 4.66 MIL/uL (ref 3.87–5.11)
RDW: 15.3 % (ref 11.5–15.5)
WBC: 4.7 10*3/uL (ref 4.0–10.5)

## 2014-03-26 LAB — COMPREHENSIVE METABOLIC PANEL
ALBUMIN: 3.9 g/dL (ref 3.5–5.2)
ALT: 27 U/L (ref 0–35)
AST: 21 U/L (ref 0–37)
Alkaline Phosphatase: 94 U/L (ref 39–117)
BUN: 8 mg/dL (ref 6–23)
CALCIUM: 9.5 mg/dL (ref 8.4–10.5)
CO2: 25 meq/L (ref 19–32)
CREATININE: 0.8 mg/dL (ref 0.50–1.10)
Chloride: 103 mEq/L (ref 96–112)
GFR calc Af Amer: >90 mL/min (ref >90)
GFR calc non Af Amer: 85 mL/min — ABNORMAL LOW (ref >90)
Glucose, Bld: 99 mg/dL (ref 70–99)
Potassium: 3.7 mEq/L (ref 3.7–5.3)
SODIUM: 142 meq/L (ref 137–147)
TOTAL PROTEIN: 7.4 g/dL (ref 6.0–8.3)
Total Bilirubin: 1 mg/dL (ref 0.3–1.2)

## 2014-03-26 MED ORDER — ONDANSETRON 4 MG PO TBDP: 4.0000 mg | ORAL_TABLET | Freq: Three times a day (TID) | ORAL | Status: DC | PRN

## 2014-03-26 MED ORDER — ONDANSETRON HCL 4 MG/2ML IJ SOLN
4.0000 mg | Freq: Once | INTRAMUSCULAR | Status: AC
  Administered 2014-03-26: 4 mg via INTRAVENOUS
  Filled 2014-03-26: qty 2

## 2014-03-26 MED ORDER — SODIUM CHLORIDE 0.9 % IV BOLUS (SEPSIS)
1000.0000 mL | Freq: Once | INTRAVENOUS | Status: AC
  Administered 2014-03-26: 1000 mL via INTRAVENOUS

## 2014-03-26 NOTE — Discharge Instructions (Signed)

## 2014-03-26 NOTE — ED Provider Notes (Signed)
Medical screening examination/treatment/procedure(s) were performed by non-physician practitioner and as supervising physician I was immediately available for consultation/collaboration.   EKG Interpretation   Date/Time:  Monday Mar 26 2014 13:24:45 EDT Ventricular Rate:  51 PR Interval:  130 QRS Duration: 96 QT Interval:  460 QTC Calculation: 423 R Axis:   68 Text Interpretation:  Sinus bradycardia Otherwise normal ECG No  significant change since last tracing Confirmed by [facility]  MD, CHARLES  [phone]) on 03/26/2014 1:35:39 PM        Charles B. Karle Starch, MD 03/26/14 [phone]

## 2014-03-26 NOTE — ED Provider Notes (Signed)
CSN: [id number]     Arrival date & time 03/26/14  1231 History   First MD Initiated Contact with Patient 03/26/14 1246     Chief Complaint  Patient presents with  . Dizziness     (Consider location/radiation/quality/duration/timing/severity/associated sxs/prior Treatment) HPI Comments: Pt states that she work up with dizziness this morning. Where the room was spinning. She has gastric sleeved done on 4/23 and hasn't been eating as much and has had diarrhea. She was told to come in and be seen for possible dehydration. No vomiting, fever or dehydration  Patient is a 48 y.o. female presenting with dizziness. The history is provided by the patient. No language interpreter was used.  Dizziness Quality:  Room spinning Severity:  Moderate Onset quality:  Sudden Timing:  Constant Progression:  Unchanged Chronicity:  New Context: not with loss of consciousness   Relieved by:  Nothing Worsened by:  Nothing tried Ineffective treatments:  None tried Associated symptoms: diarrhea   Associated symptoms: no shortness of breath and no vomiting     Past Medical History  Diagnosis Date  . Back pain, chronic   . Anxiety   . Depression    Past Surgical History  Procedure Laterality Date  . Back surgery    . Tubal ligation    . Knee arthroscopy    . Back fusion    . Anterior cervical decomp/discectomy fusion N/A 11/22/2013    Procedure: ANTERIOR CERVICAL DECOMPRESSION/DISCECTOMY FUSION 1 LEVEL;  Surgeon: Sinclair Ship, MD;  Location: [facility];  Service: Orthopedics;  Laterality: N/A;  Anterior cervical decompression fusion, cervical 5-6 with instrumentation and allograft  . Laparoscopic gastric sleeve resection     No family history on file. History  Substance Use Topics  . Smoking status: Never Smoker   . Smokeless tobacco: Not on file  . Alcohol Use: No   OB History   Grav Para Term Preterm Abortions TAB SAB Ect Mult Living                 Review of Systems  Respiratory:  Negative for shortness of breath.   Cardiovascular: Negative.   Gastrointestinal: Positive for diarrhea. Negative for vomiting.  Neurological: Positive for dizziness.      Allergies  Review of patient's allergies indicates no known allergies.  Home Medications   Prior to Admission medications   Medication Sig Start Date End Date Taking? Authorizing Provider  DULoxetine (CYMBALTA) 30 MG capsule Take 30 mg by mouth daily.    Historical Provider, MD  gabapentin (NEURONTIN) 300 MG capsule Take 300 mg by mouth 2 (two) times daily.      Historical Provider, MD   BP 148/111  Pulse 105  Temp(Src) 98.8 F (37.1 C) (Oral)  Resp 16  Ht 5\' 6"  (1.676 m)  Wt 223 lb (101.152 kg)  BMI 36.01 kg/m2  SpO2 100%  LMP 03/22/2014 Physical Exam  Nursing note and vitals reviewed. Constitutional: She is oriented to person, place, and time. She appears well-developed and well-nourished.  HENT:  Head: Atraumatic.  Right Ear: External ear normal.  Left Ear: External ear normal.  Cardiovascular: Normal rate and regular rhythm.   Pulmonary/Chest: Effort normal and breath sounds normal.  Abdominal: Soft. Bowel sounds are normal. There is no tenderness.  Musculoskeletal: Normal range of motion.  Neurological: She is alert and oriented to person, place, and time.  Skin: Skin is warm and dry.  Psychiatric: She has a normal mood and affect.    ED Course  Procedures (  including critical care time) Labs Review Labs Reviewed  COMPREHENSIVE METABOLIC PANEL - Abnormal; Notable for the following:    GFR calc non Af Amer 85 (*)    All other components within normal limits  CBC WITH DIFFERENTIAL    Imaging Review Dg Chest 2 View  03/26/2014   CLINICAL DATA:  Dizziness  EXAM: CHEST  2 VIEW  COMPARISON:  November 10, 2013  FINDINGS: The lungs are clear. Heart size and pulmonary vascularity are normal. No adenopathy. There is postoperative change in the lower cervical spine.  IMPRESSION: No edema or  consolidation.   Electronically Signed   By: Lowella Grip M.D.   On: 03/26/2014 13:42     EKG Interpretation   Date/Time:  Monday Mar 26 2014 13:24:45 EDT Ventricular Rate:  51 PR Interval:  130 QRS Duration: 96 QT Interval:  460 QTC Calculation: 423 R Axis:   68 Text Interpretation:  Sinus bradycardia Otherwise normal ECG No  significant change since last tracing Confirmed by [facility]  MD, Juanda Crumble  [phone]) on 03/26/2014 1:35:39 PM      MDM   Final diagnoses:  Dizziness    Pt is feeling better at this time after a liter or fluids. Pt is tolerating po and is ambulating without any problems. Pt is okay to follow up as needed.    Glendell Docker, NP 03/26/14 [place], NP 03/26/14 1458

## 2014-03-26 NOTE — ED Notes (Signed)
Woke with dizziness this am-"room spinning"-gastric sleeve surgery 4/23-called surgeon office-concerned she may be dehydrated

## 2014-05-05 ENCOUNTER — Encounter (HOSPITAL_BASED_OUTPATIENT_CLINIC_OR_DEPARTMENT_OTHER): Payer: Self-pay | Admitting: Emergency Medicine

## 2014-05-05 ENCOUNTER — Emergency Department (HOSPITAL_BASED_OUTPATIENT_CLINIC_OR_DEPARTMENT_OTHER): Payer: Medicare Other

## 2014-05-05 ENCOUNTER — Emergency Department (HOSPITAL_BASED_OUTPATIENT_CLINIC_OR_DEPARTMENT_OTHER)
Admission: EM | Admit: 2014-05-05 | Discharge: 2014-05-05 | Disposition: A | Discharge status: Home / Self Care | Payer: Medicare Other | Attending: Emergency Medicine | Admitting: Emergency Medicine

## 2014-05-05 DIAGNOSIS — Y9289 Other specified places as the place of occurrence of the external cause: Secondary | ICD-10-CM | POA: Insufficient documentation

## 2014-05-05 DIAGNOSIS — Y9389 Activity, other specified: Secondary | ICD-10-CM | POA: Insufficient documentation

## 2014-05-05 DIAGNOSIS — Z23 Encounter for immunization: Secondary | ICD-10-CM | POA: Insufficient documentation

## 2014-05-05 DIAGNOSIS — F329 Major depressive disorder, single episode, unspecified: Secondary | ICD-10-CM | POA: Insufficient documentation

## 2014-05-05 DIAGNOSIS — W278XXA Contact with other nonpowered hand tool, initial encounter: Secondary | ICD-10-CM | POA: Diagnosis not present

## 2014-05-05 DIAGNOSIS — F411 Generalized anxiety disorder: Secondary | ICD-10-CM | POA: Diagnosis not present

## 2014-05-05 DIAGNOSIS — S91309A Unspecified open wound, unspecified foot, initial encounter: Secondary | ICD-10-CM | POA: Insufficient documentation

## 2014-05-05 DIAGNOSIS — Z79899 Other long term (current) drug therapy: Secondary | ICD-10-CM | POA: Insufficient documentation

## 2014-05-05 DIAGNOSIS — Z792 Long term (current) use of antibiotics: Secondary | ICD-10-CM | POA: Insufficient documentation

## 2014-05-05 DIAGNOSIS — IMO0002 Reserved for concepts with insufficient information to code with codable children: Secondary | ICD-10-CM | POA: Diagnosis present

## 2014-05-05 DIAGNOSIS — G8929 Other chronic pain: Secondary | ICD-10-CM | POA: Insufficient documentation

## 2014-05-05 DIAGNOSIS — T148XXA Other injury of unspecified body region, initial encounter: Secondary | ICD-10-CM

## 2014-05-05 DIAGNOSIS — F3289 Other specified depressive episodes: Secondary | ICD-10-CM | POA: Diagnosis not present

## 2014-05-05 MED ORDER — CIPROFLOXACIN HCL 500 MG PO TABS: 500.0000 mg | ORAL_TABLET | Freq: Two times a day (BID) | ORAL | Status: DC

## 2014-05-05 MED ORDER — TETANUS-DIPHTH-ACELL PERTUSSIS 5-2.5-18.5 LF-MCG/0.5 IM SUSP
0.5000 mL | Freq: Once | INTRAMUSCULAR | Status: AC
  Administered 2014-05-05: 0.5 mL via INTRAMUSCULAR
  Filled 2014-05-05: qty 0.5

## 2014-05-05 NOTE — ED Provider Notes (Signed)
CSN: [id number]     Arrival date & time 05/05/14  1614 History   First MD Initiated Contact with Patient 05/05/14 1630    This chart was scribed for Robin Johns, MD by Terressa Koyanagi, ED Scribe. This patient was seen in room MH12/MH12 and the patient's care was started at 4:32 PM.  PCP: Avel Sensor, MD  Chief Complaint  Patient presents with  . Foreign Body in Skin   HPI HPI Comments: Robin Schmidt is a 48 y.o. female who presents to the Emergency Department complaining of a metal sewing needle to the foot of her left foot onset this morning. Pt reports that she stepped on a metal sewing needle this morning and the needle went through her flip flop and a part of the needle broke off on the heel of her left foot. Pt rates her current left foot pain a 4 out of 10. Pt reports she is not UTD with her tetanus shot.   Past Medical History  Diagnosis Date  . Back pain, chronic   . Anxiety   . Depression    Past Surgical History  Procedure Laterality Date  . Back surgery    . Tubal ligation    . Knee arthroscopy    . Back fusion    . Anterior cervical decomp/discectomy fusion N/A 11/22/2013    Procedure: ANTERIOR CERVICAL DECOMPRESSION/DISCECTOMY FUSION 1 LEVEL;  Surgeon: Sinclair Ship, MD;  Location: [facility];  Service: Orthopedics;  Laterality: N/A;  Anterior cervical decompression fusion, cervical 5-6 with instrumentation and allograft  . Laparoscopic gastric sleeve resection     History reviewed. No pertinent family history. History  Substance Use Topics  . Smoking status: Never Smoker   . Smokeless tobacco: Not on file  . Alcohol Use: No   OB History   Grav Para Term Preterm Abortions TAB SAB Ect Mult Living                 Review of Systems  Constitutional: Negative for fever.  Gastrointestinal: Negative for nausea and vomiting.  Musculoskeletal: Negative for back pain and neck pain.  Skin: Positive for wound (puncture in left heel with metal sewing needle ).   Neurological: Negative for weakness, numbness and headaches.      Allergies  Review of patient's allergies indicates no known allergies.  Home Medications   Prior to Admission medications   Medication Sig Start Date End Date Taking? Authorizing Provider  DULoxetine (CYMBALTA) 30 MG capsule Take 30 mg by mouth daily.   Yes Historical Provider, MD  gabapentin (NEURONTIN) 300 MG capsule Take 300 mg by mouth 2 (two) times daily.     Yes Historical Provider, MD  ondansetron (ZOFRAN ODT) 4 MG disintegrating tablet Take 1 tablet (4 mg total) by mouth every 8 (eight) hours as needed for nausea or vomiting. 03/26/14  Yes Glendell Docker, NP  ciprofloxacin (CIPRO) 500 MG tablet Take 1 tablet (500 mg total) by mouth 2 (two) times daily. One po bid x 7 days 05/05/14   Robin Johns, MD   Triage Vitals: BP 138/85  Pulse 95  Temp(Src) 98 F (36.7 C) (Oral)  Resp 16  Ht 5\' 6"  (1.676 m)  Wt 206 lb (93.441 kg)  BMI 33.27 kg/m2  SpO2 97% Physical Exam  Constitutional: She is oriented to person, place, and time. She appears well-developed and well-nourished.  HENT:  Head: Normocephalic and atraumatic.  Neck: Normal range of motion. Neck supple.  Cardiovascular: Normal rate.   Pulmonary/Chest:  Effort normal.  Musculoskeletal: She exhibits tenderness. She exhibits no edema.  Neurological: She is alert and oriented to person, place, and time.  Skin: Skin is warm and dry.  Small puncture wound to the heel of the left foot no palpable or visible foreign bodies.   Psychiatric: She has a normal mood and affect.    ED Course  Procedures (including critical care time) DIAGNOSTIC STUDIES: Oxygen Saturation is 97% on RA, nl by my interpretation.    COORDINATION OF CARE: 4:35 PM-Discussed treatment plan which includes imaging with pt at bedside and pt agreed to plan.   Labs Review Labs Reviewed - No data to display  Imaging Review Dg Foot Complete Left  05/05/2014   CLINICAL DATA:  Possible  foreign body.  Stepped on a needle.  EXAM: LEFT FOOT - COMPLETE 3+ VIEW  COMPARISON:  None.  FINDINGS: Mild hallux valgus deformity. Small Achilles and calcaneal spurs. No radiopaque foreign object.  IMPRESSION: No radiopaque foreign object.   Electronically Signed   By: Abigail Miyamoto M.D.   On: 05/05/2014 16:57     EKG Interpretation None      MDM   Final diagnoses:  Puncture wound    No FB seen on x-ray, updated TDAP, given wound care instructions, started on cipro since needle went through flip flop.  I personally performed the services described in this documentation, which was scribed in my presence.  The recorded information has been reviewed and considered.    Robin Johns, MD 05/05/14 1723

## 2014-05-05 NOTE — Discharge Instructions (Signed)
Puncture Wound °A puncture wound is an injury that extends through all layers of the skin and into the tissue beneath the skin (subcutaneous tissue). Puncture wounds become infected easily because germs often enter the body and go beneath the skin during the injury. Having a deep wound with a small entrance point makes it difficult for your caregiver to adequately clean the wound. This is especially true if you have stepped on a nail and it has passed through a dirty shoe or other situations where the wound is obviously contaminated. °CAUSES  °Many puncture wounds involve glass, nails, splinters, fish hooks, or other objects that enter the skin (foreign bodies). A puncture wound may also be caused by a human bite or animal bite. °DIAGNOSIS  °A puncture wound is usually diagnosed by your history and a physical exam. You may need to have an X-ray or an ultrasound to check for any foreign bodies still in the wound. °TREATMENT  °· Your caregiver will clean the wound as thoroughly as possible. Depending on the location of the wound, a bandage (dressing) may be applied. °· Your caregiver might prescribe antibiotic medicines. °· You may need a follow-up visit to check on your wound. Follow all instructions as directed by your caregiver. °HOME CARE INSTRUCTIONS  °· Change your dressing once per day, or as directed by your caregiver. If the dressing sticks, it may be removed by soaking the area in water. °· If your caregiver has given you follow-up instructions, it is very important that you return for a follow-up appointment. Not following up as directed could result in a chronic or permanent injury, pain, and disability. °· Only take over-the-counter or prescription medicines for pain, discomfort, or fever as directed by your caregiver. °· If you are given antibiotics, take them as directed. Finish them even if you start to feel better. °You may need a tetanus shot if: °· You cannot remember when you had your last tetanus  shot. °· You have never had a tetanus shot. °If you got a tetanus shot, your arm may swell, get red, and feel warm to the touch. This is common and not a problem. If you need a tetanus shot and you choose not to have one, there is a rare chance of getting tetanus. Sickness from tetanus can be serious. °You may need a rabies shot if an animal bite caused your puncture wound. °SEEK MEDICAL CARE IF:  °· You have redness, swelling, or increasing pain in the wound. °· You have red streaks going away from the wound. °· You notice a bad smell coming from the wound or dressing. °· You have yellowish-white fluid (pus) coming from the wound. °· You are treated with an antibiotic for infection, but the infection is not getting better. °· You notice something in the wound, such as rubber from your shoe, cloth, or another object. °· You have a fever. °· You have severe pain. °· You have difficulty breathing. °· You feel dizzy or faint. °· You cannot stop vomiting. °· You lose feeling, develop numbness, or cannot move a limb below the wound. °· Your symptoms worsen. °MAKE SURE YOU: °· Understand these instructions. °· Will watch your condition. °· Will get help right away if you are not doing well or get worse. °Document Released: 08/05/2005 Document Revised: 01/18/2012 Document Reviewed: 04/14/2011 °Exit[employer]® Patient Information ©2015 [employer]. This information is not intended to replace advice given to you by your health care provider. Make sure you discuss any questions you   have with your health care provider. ° °

## 2014-05-05 NOTE — ED Notes (Signed)
Pt states she stepped on a sewing needle this morning with her left foot and feels like there is a bump there where the needle may still be located.

## 2014-07-05 ENCOUNTER — Emergency Department (HOSPITAL_BASED_OUTPATIENT_CLINIC_OR_DEPARTMENT_OTHER)
Admission: EM | Admit: 2014-07-05 | Discharge: 2014-07-06 | Disposition: A | Discharge status: Home / Self Care | Payer: Medicare Other | Attending: Emergency Medicine | Admitting: Emergency Medicine

## 2014-07-05 ENCOUNTER — Encounter (HOSPITAL_BASED_OUTPATIENT_CLINIC_OR_DEPARTMENT_OTHER): Payer: Self-pay | Admitting: Emergency Medicine

## 2014-07-05 DIAGNOSIS — F329 Major depressive disorder, single episode, unspecified: Secondary | ICD-10-CM | POA: Diagnosis not present

## 2014-07-05 DIAGNOSIS — F3289 Other specified depressive episodes: Secondary | ICD-10-CM | POA: Diagnosis not present

## 2014-07-05 DIAGNOSIS — Z79899 Other long term (current) drug therapy: Secondary | ICD-10-CM | POA: Insufficient documentation

## 2014-07-05 DIAGNOSIS — G8929 Other chronic pain: Secondary | ICD-10-CM | POA: Insufficient documentation

## 2014-07-05 DIAGNOSIS — F411 Generalized anxiety disorder: Secondary | ICD-10-CM | POA: Insufficient documentation

## 2014-07-05 DIAGNOSIS — Z792 Long term (current) use of antibiotics: Secondary | ICD-10-CM | POA: Insufficient documentation

## 2014-07-05 DIAGNOSIS — H109 Unspecified conjunctivitis: Secondary | ICD-10-CM

## 2014-07-05 NOTE — ED Notes (Signed)
Left eye redness and pain. Was exposed to pink eye.

## 2014-07-06 DIAGNOSIS — H109 Unspecified conjunctivitis: Secondary | ICD-10-CM | POA: Diagnosis not present

## 2014-07-06 MED ORDER — TETRACAINE HCL 0.5 % OP SOLN
1.0000 [drp] | Freq: Once | OPHTHALMIC | Status: AC
  Administered 2014-07-06: 1 [drp] via OPHTHALMIC
  Filled 2014-07-06: qty 2

## 2014-07-06 MED ORDER — FLUORESCEIN SODIUM 1 MG OP STRP
2.0000 | ORAL_STRIP | Freq: Once | OPHTHALMIC | Status: AC
  Administered 2014-07-06: 2 via OPHTHALMIC
  Filled 2014-07-06: qty 2

## 2014-07-06 MED ORDER — ERYTHROMYCIN 5 MG/GM OP OINT
TOPICAL_OINTMENT | Freq: Four times a day (QID) | OPHTHALMIC | Status: DC
  Administered 2014-07-06: 1 via OPHTHALMIC
  Filled 2014-07-06: qty 3.5

## 2014-07-06 NOTE — Discharge Instructions (Signed)
Use eye antibiotic for the next 5 days.  Follow up with the eye doctor listed above if symptoms worse or you have new worsening symptoms.   Conjunctivitis Conjunctivitis is commonly called "pink eye." Conjunctivitis can be caused by bacterial or viral infection, allergies, or injuries. There is usually redness of the lining of the eye, itching, discomfort, and sometimes discharge. There may be deposits of matter along the eyelids. A viral infection usually causes a watery discharge, while a bacterial infection causes a yellowish, thick discharge. Pink eye is very contagious and spreads by direct contact. You may be given antibiotic eyedrops as part of your treatment. Before using your eye medicine, remove all drainage from the eye by washing gently with warm water and cotton balls. Continue to use the medication until you have awakened 2 mornings in a row without discharge from the eye. Do not rub your eye. This increases the irritation and helps spread infection. Use separate towels from other household members. Wash your hands with soap and water before and after touching your eyes. Use cold compresses to reduce pain and sunglasses to relieve irritation from light. Do not wear contact lenses or wear eye makeup until the infection is gone. SEEK MEDICAL CARE IF:   Your symptoms are not better after 3 days of treatment.  You have increased pain or trouble seeing.  The outer eyelids become very red or swollen. Document Released: 12/03/2004 Document Revised: 01/18/2012 Document Reviewed: 10/26/2005 [facility] Patient Information 2015 [place], [place]. This information is not intended to replace advice given to you by your health care provider. Make sure you discuss any questions you have with your health care provider.

## 2014-07-06 NOTE — ED Provider Notes (Signed)
CSN: [id number]     Arrival date & time 07/05/14  2243 History   First MD Initiated Contact with Patient 07/05/14 2357     Chief Complaint  Patient presents with  . Conjunctivitis     (Consider location/radiation/quality/duration/timing/severity/associated sxs/prior Treatment) HPI 48 year old female presents to the emergency department with complaint of left eye discharge, gritty feeling, itching.  Symptoms started today.  She reports that her grandson recently was treated for conjunctivitis.  She denies any change in her vision.  She reports her left eye feels heavy.  Patient does not wear contacts.  No other medical problems.  No fevers chills or recent URI symptoms Past Medical History  Diagnosis Date  . Back pain, chronic   . Anxiety   . Depression    Past Surgical History  Procedure Laterality Date  . Back surgery    . Tubal ligation    . Knee arthroscopy    . Back fusion    . Anterior cervical decomp/discectomy fusion N/A 11/22/2013    Procedure: ANTERIOR CERVICAL DECOMPRESSION/DISCECTOMY FUSION 1 LEVEL;  Surgeon: Sinclair Ship, MD;  Location: [place];  Service: Orthopedics;  Laterality: N/A;  Anterior cervical decompression fusion, cervical 5-6 with instrumentation and allograft  . Laparoscopic gastric sleeve resection     No family history on file. History  Substance Use Topics  . Smoking status: Never Smoker   . Smokeless tobacco: Not on file  . Alcohol Use: No   OB History   Grav Para Term Preterm Abortions TAB SAB Ect Mult Living                 Review of Systems  See History of Present Illness; otherwise all other systems are reviewed and negative   Allergies  Review of patient's allergies indicates no known allergies.  Home Medications   Prior to Admission medications   Medication Sig Start Date End Date Taking? Authorizing Provider  ciprofloxacin (CIPRO) 500 MG tablet Take 1 tablet (500 mg total) by mouth 2 (two) times daily. One po bid x 7 days  05/05/14   Malvin Johns, MD  DULoxetine (CYMBALTA) 30 MG capsule Take 30 mg by mouth daily.    Historical Provider, MD  gabapentin (NEURONTIN) 300 MG capsule Take 300 mg by mouth 2 (two) times daily.      Historical Provider, MD  ondansetron (ZOFRAN ODT) 4 MG disintegrating tablet Take 1 tablet (4 mg total) by mouth every 8 (eight) hours as needed for nausea or vomiting. 03/26/14   Glendell Docker, NP   BP 122/79  Pulse 59  Temp(Src) 98.3 F (36.8 C) (Oral)  Resp 16  Ht 5\' 6"  (1.676 m)  Wt 186 lb (84.369 kg)  BMI 30.04 kg/m2  SpO2 100%  LMP 06/22/2014 Physical Exam  HENT:  Head: Normocephalic and atraumatic.  Right Ear: External ear normal.  Left Ear: External ear normal.  Nose: Nose normal.  Mouth/Throat: Oropharynx is clear and moist.  Eyes: EOM are normal. Pupils are equal, round, and reactive to light. Lids are everted and swept, no foreign bodies found. Right eye exhibits no chemosis, no discharge, no exudate and no hordeolum. No foreign body present in the right eye. Left eye exhibits discharge and exudate. Left eye exhibits no chemosis and no hordeolum. No foreign body present in the left eye. Right conjunctiva is not injected. Right conjunctiva has no hemorrhage. Left conjunctiva is injected. Left conjunctiva has no hemorrhage. No scleral icterus.  Slit lamp exam:  The right eye shows no corneal abrasion, no corneal flare, no corneal ulcer, no foreign body, no hyphema, no hypopyon, no fluorescein uptake and no anterior chamber bulge.       The left eye shows fluorescein uptake (left eye with diffuse fluorescein uptake consistent with rubbing of the eye). The left eye shows no corneal flare, no corneal ulcer, no foreign body, no hyphema, no hypopyon and no anterior chamber bulge.  Cardiovascular: Normal rate, regular rhythm, normal heart sounds and intact distal pulses.  Exam reveals no gallop and no friction rub.   No murmur heard. Pulmonary/Chest: Effort normal and breath  sounds normal. No respiratory distress. She has no wheezes. She has no rales. She exhibits no tenderness.    ED Course  Procedures (including critical care time) Labs Review Labs Reviewed - No data to display  Imaging Review No results found.   EKG Interpretation None      MDM   Final diagnoses:  Conjunctivitis of left eye    48 year old female with conjunctivitis of the left eye.  Recent exposure to same.  Exam shows discharge, diffuse uptake consistent with rubbing of the eye, but no other significant findings.  Will treat with erythromycin ointment.    Kalman Drape, MD 07/06/14 [phone]

## 2014-07-06 NOTE — ED Notes (Signed)
MD at bedside. 

## 2014-07-17 DIAGNOSIS — G5600 Carpal tunnel syndrome, unspecified upper limb: Secondary | ICD-10-CM | POA: Insufficient documentation

## 2014-07-17 DIAGNOSIS — G4733 Obstructive sleep apnea (adult) (pediatric): Secondary | ICD-10-CM | POA: Insufficient documentation

## 2015-01-25 ENCOUNTER — Other Ambulatory Visit (HOSPITAL_BASED_OUTPATIENT_CLINIC_OR_DEPARTMENT_OTHER): Payer: Self-pay | Admitting: Obstetrics and Gynecology

## 2015-01-25 DIAGNOSIS — Z1231 Encounter for screening mammogram for malignant neoplasm of breast: Secondary | ICD-10-CM

## 2015-02-01 ENCOUNTER — Ambulatory Visit (HOSPITAL_BASED_OUTPATIENT_CLINIC_OR_DEPARTMENT_OTHER)
Admission: RE | Admit: 2015-02-01 | Discharge: 2015-02-01 | Disposition: A | Discharge status: Home / Self Care | Payer: Medicare Other | Source: Ambulatory Visit | Attending: Obstetrics and Gynecology | Admitting: Obstetrics and Gynecology

## 2015-02-01 DIAGNOSIS — Z1231 Encounter for screening mammogram for malignant neoplasm of breast: Secondary | ICD-10-CM | POA: Diagnosis present

## 2015-02-01 DIAGNOSIS — R928 Other abnormal and inconclusive findings on diagnostic imaging of breast: Secondary | ICD-10-CM | POA: Diagnosis not present

## 2015-02-04 ENCOUNTER — Other Ambulatory Visit: Payer: Self-pay | Admitting: Obstetrics and Gynecology

## 2015-02-04 DIAGNOSIS — R928 Other abnormal and inconclusive findings on diagnostic imaging of breast: Secondary | ICD-10-CM

## 2015-02-11 ENCOUNTER — Ambulatory Visit
Admission: RE | Admit: 2015-02-11 | Discharge: 2015-02-11 | Disposition: A | Discharge status: Home / Self Care | Payer: Medicare Other | Source: Ambulatory Visit | Attending: Obstetrics and Gynecology | Admitting: Obstetrics and Gynecology

## 2015-02-11 DIAGNOSIS — R928 Other abnormal and inconclusive findings on diagnostic imaging of breast: Secondary | ICD-10-CM

## 2015-08-27 ENCOUNTER — Ambulatory Visit: Payer: Medicare Other | Attending: Orthopedic Surgery | Admitting: Physical Therapy

## 2015-08-27 DIAGNOSIS — R29898 Other symptoms and signs involving the musculoskeletal system: Secondary | ICD-10-CM

## 2015-08-27 DIAGNOSIS — M25512 Pain in left shoulder: Secondary | ICD-10-CM | POA: Insufficient documentation

## 2015-08-27 DIAGNOSIS — R293 Abnormal posture: Secondary | ICD-10-CM

## 2015-08-27 DIAGNOSIS — M7582 Other shoulder lesions, left shoulder: Secondary | ICD-10-CM | POA: Diagnosis present

## 2015-08-27 DIAGNOSIS — M25612 Stiffness of left shoulder, not elsewhere classified: Secondary | ICD-10-CM

## 2015-08-27 NOTE — Patient Instructions (Signed)
SHOULDER: External Rotation - Supine (Cane)   Hold cane with both hands. Rotate arm away from body. Keep elbow on floor and next to body. _10-15__ reps per set, __2-3_ sets per day. Add towel to keep elbow at side.  Copyright  VHI. All rights reserved.    Cane Horizontal - Supine   With straight arms holding cane above shoulders, bring cane out to right, center, out to left, and back to above head. Repeat __10-15_ times. Do _2-3__ times per day.  Copyright  VHI. All rights reserved.    Cane Exercise: Flexion   Lie on back, holding cane above chest. Keeping arms as straight as possible, lower cane toward floor beyond head. Hold _2-3___ seconds. Repeat _10-15___ times. Do _2-3___ sessions per day.  http://[email or web address].exer.us/91   Copyright  VHI. All rights reserved.

## 2015-08-27 NOTE — Therapy (Signed)
[facility] [address]  [place], [place], [address] Phone: [phone]   Fax:  [phone]  Physical Therapy Evaluation  Patient Details  Name: Robin Schmidt MRN: [id number] Date of Birth: 04-28-1966 Referring Provider: Tania Ade  Encounter Date: 08/27/2015      PT End of Session - 08/27/15 1418    Visit Number 1   Number of Visits 16   Date for PT Re-Evaluation 10/22/15   PT Start Time 3532   PT Stop Time 1410   PT Time Calculation (min) 25 min   Activity Tolerance Patient tolerated treatment well   Behavior During Therapy [facility] for tasks assessed/performed      Past Medical History  Diagnosis Date  . Back pain, chronic   . Anxiety   . Depression     Past Surgical History  Procedure Laterality Date  . Back surgery    . Tubal ligation    . Knee arthroscopy    . Back fusion    . Anterior cervical decomp/discectomy fusion N/A 11/22/2013    Procedure: ANTERIOR CERVICAL DECOMPRESSION/DISCECTOMY FUSION 1 LEVEL;  Surgeon: Sinclair Ship, MD;  Location: [facility];  Service: Orthopedics;  Laterality: N/A;  Anterior cervical decompression fusion, cervical 5-6 with instrumentation and allograft  . Laparoscopic gastric sleeve resection      There were no vitals filed for this visit.  Visit Diagnosis:  Decreased ROM of left shoulder - Plan: PT plan of care cert/re-cert  Left shoulder pain - Plan: PT plan of care cert/re-cert  Upper extremity weakness - Plan: PT plan of care cert/re-cert  Abnormal posture - Plan: PT plan of care cert/re-cert      Subjective Assessment - 08/27/15 1348    Subjective Pt is a 49 y/o female who presents to OPPT s/p L shoulder SAD and distal clavicle resection on 08/06/15.  Pt presents today with pain and decreased strength and ROM affecting ADL.   Limitations Lifting;House hold activities   Patient Stated Goals decrease pain; perform ADLs    Currently in Pain? Yes   Pain Score 9   no acute distress   Pain Location Shoulder   Pain Orientation Left   Pain Descriptors / Indicators Sharp;Nagging;Aching   Pain Type Surgical pain   Pain Onset 1 to 4 weeks ago   Pain Frequency Constant   Aggravating Factors  activity   Pain Relieving Factors ice, meds            [facility] PT Assessment - 08/27/15 1352    Assessment   Medical Diagnosis s/p L shoulder SAD/DCR   Referring Provider Tania Ade   Onset Date/Surgical Date 08/06/15   Hand Dominance Right   Next MD Visit 1 month   Prior Therapy none   Balance Screen   Has the patient fallen in the past 6 months No   Has the patient had a decrease in activity level because of a fear of falling?  No   Is the patient reluctant to leave their home because of a fear of falling?  No   Home Environment   Living Environment Private residence   Living Arrangements Children;Parent  mother, 19 y/o son   Available Help at Discharge Family   Prior Function   Level of Independence Independent   Vocation On disability   Leisure reading, writing, gardening   Observation/Other Assessments   Focus on Therapeutic Outcomes (FOTO)  48 (52% limited; predicted 32% limited)   Posture/Postural Control  Posture/Postural Control Postural limitations   Postural Limitations Rounded Shoulders;Forward head   AROM   AROM Assessment Site Shoulder   Left Shoulder Flexion 96 Degrees   Left Shoulder ABduction 82 Degrees   Left Shoulder Internal Rotation 40 Degrees   Left Shoulder External Rotation --  unable   PROM   PROM Assessment Site Shoulder   Left Shoulder Flexion 116 Degrees   Left Shoulder ABduction 107 Degrees   Left Shoulder Internal Rotation 60 Degrees   Left Shoulder External Rotation 13 Degrees   Strength   Strength Assessment Site Shoulder;Elbow   Right Shoulder Flexion 3+/5   Right Shoulder Extension 3+/5   Right Shoulder ABduction 3+/5   Right Shoulder Internal Rotation 5/5   Right Shoulder External  Rotation 3+/5   Left Shoulder Flexion 3-/5   Left Shoulder Extension 3-/5   Left Shoulder ABduction 3-/5   Left Shoulder Internal Rotation 3-/5   Left Shoulder External Rotation 3-/5   Right/Left Elbow Right;Left   Right Elbow Flexion 5/5   Right Elbow Extension 5/5   Left Elbow Flexion 4/5   Left Elbow Extension 4/5   Palpation   Palpation comment tenderness along L clavicle                   [facility] Adult PT Treatment/Exercise - 08/27/15 1404    Exercises   Exercises Shoulder   Shoulder Exercises: Supine   Other Supine Exercises supine cane exercises-see handout                PT Education - 08/27/15 1418    Education provided Yes   Education Details HEP, goals of care and POC   Person(s) Educated Patient   Methods Explanation;Demonstration;Handout   Comprehension Verbalized understanding;Need further instruction;Returned demonstration          PT Short Term Goals - 08/27/15 1421    PT SHORT TERM GOAL #1   Title independent with HEP (09/24/15)   Time 4   Period Weeks   Status New   PT SHORT TERM GOAL #2   Title improve L shoulder AROM by 10 degrees each motion for improved function (09/24/15)   Time 4   Period Weeks   Status New   PT SHORT TERM GOAL #3   Title report pain < 7/10 with activity (09/24/15)   Time 4   Period Weeks   Status New           PT Long Term Goals - 08/27/15 1422    PT LONG TERM GOAL #1   Title independent with advanced HEP (10/22/15)   Time 8   Period Weeks   Status New   PT LONG TERM GOAL #2   Title improve AROM to [facility] for improved function (10/22/15)   Time 8   Period Weeks   Status New   PT LONG TERM GOAL #3   Title report pain < 5/10 with activity for improved function (10/22/15)   Time 8   Period Weeks   Status New   PT LONG TERM GOAL #4   Title improve L UE strength to 4/5 for improved function (10/22/15)   Time 8   Period Weeks   Status New               Plan - 08/27/15 1418     Clinical Impression Statement Pt is a 49 y/o female who presents to OPPT s/p L SAD/DCR.  Pt demonstrates decreased ROM and strength as well as pain affecting  ADLs.  Will benefit from PT to maxmize function and minimize pain.   Pt will benefit from skilled therapeutic intervention in order to improve on the following deficits Postural dysfunction;Decreased strength;Impaired UE functional use;Pain;Decreased range of motion   Rehab Potential Good   PT Frequency 2x / week   PT Duration 8 weeks   PT Treatment/Interventions ADLs/Self Care Home Management;Electrical Stimulation;Moist Heat;Iontophoresis 4mg /ml Dexamethasone;Therapeutic exercise;Therapeutic activities;Functional mobility training;Cryotherapy;Ultrasound;Patient/family education;Manual techniques;Vasopneumatic Device;Passive range of motion;Scar mobilization   PT Next Visit Plan review HEP, focus on ROM and scap stabilization   Consulted and Agree with Plan of Care Patient          G-Codes - Sep 26, 2015 1424    Functional Assessment Tool Used FOTO 52% limited   Functional Limitation Carrying, moving and handling objects   Carrying, Moving and Handling Objects Current Status (Z6109) At least 40 percent but less than 60 percent impaired, limited or restricted   Carrying, Moving and Handling Objects Goal Status (U0454) At least 20 percent but less than 40 percent impaired, limited or restricted       Problem List Patient Active Problem List   Diagnosis Date Noted  . Radiculopathy 11/22/2013  . Menometrorrhagia 06/08/2012    Class: Chronic   Laureen Abrahams, PT, DPT 26-Sep-2015 2:27 PM  [facility] [address]  [place], [place], [address] Phone: [phone]   Fax:  [phone]  Name: Robin Schmidt MRN: [id number] Date of Birth: Jul 22, 1966

## 2015-09-03 ENCOUNTER — Ambulatory Visit: Payer: Medicare Other | Admitting: Physical Therapy

## 2015-09-03 DIAGNOSIS — M7582 Other shoulder lesions, left shoulder: Secondary | ICD-10-CM | POA: Diagnosis not present

## 2015-09-03 DIAGNOSIS — R293 Abnormal posture: Secondary | ICD-10-CM

## 2015-09-03 DIAGNOSIS — M25612 Stiffness of left shoulder, not elsewhere classified: Secondary | ICD-10-CM

## 2015-09-03 DIAGNOSIS — M25512 Pain in left shoulder: Secondary | ICD-10-CM

## 2015-09-03 DIAGNOSIS — R29898 Other symptoms and signs involving the musculoskeletal system: Secondary | ICD-10-CM

## 2015-09-03 NOTE — Therapy (Signed)
[facility] [address]  [place], [place], [address] Phone: [phone]   Fax:  [phone]  Physical Therapy Treatment  Patient Details  Name: Robin Schmidt MRN: [id number] Date of Birth: [birth year] Referring Provider: Tania Ade  Encounter Date: 09/03/2015      PT End of Session - 09/03/15 1340    Visit Number 2   Number of Visits 16   Date for PT Re-Evaluation 10/22/15   PT Start Time 1310  pt arrived late   PT Stop Time 1354   PT Time Calculation (min) 44 min   Activity Tolerance Patient tolerated treatment well   Behavior During Therapy [facility] for tasks assessed/performed      Past Medical History  Diagnosis Date  . Back pain, chronic   . Anxiety   . Depression     Past Surgical History  Procedure Laterality Date  . Back surgery    . Tubal ligation    . Knee arthroscopy    . Back fusion    . Anterior cervical decomp/discectomy fusion N/A 11/22/2013    Procedure: ANTERIOR CERVICAL DECOMPRESSION/DISCECTOMY FUSION 1 LEVEL;  Surgeon: Sinclair Ship, MD;  Location: [place];  Service: Orthopedics;  Laterality: N/A;  Anterior cervical decompression fusion, cervical 5-6 with instrumentation and allograft  . Laparoscopic gastric sleeve resection      There were no vitals filed for this visit.  Visit Diagnosis:  Decreased ROM of left shoulder  Left shoulder pain  Upper extremity weakness  Abnormal posture      Subjective Assessment - 09/03/15 1313    Subjective Shoulder aching in the cold; still has some pain with movement   Patient Stated Goals decrease pain; perform ADLs    Currently in Pain? Yes   Pain Score 8    Pain Location Shoulder   Pain Orientation Left   Pain Descriptors / Indicators Sharp;Nagging;Aching   Pain Type Surgical pain   Pain Onset 1 to 4 weeks ago   Pain Frequency Constant   Aggravating Factors  movement, activity   Pain Relieving Factors ice, meds                          [facility] Adult PT Treatment/Exercise - 09/03/15 1314    Shoulder Exercises: Supine   Flexion Strengthening;Left;15 reps;Weights   Shoulder Flexion Weight (lbs) 1   Other Supine Exercises supine cane exercises-see handout   Other Supine Exercises chest press 1# x 15   Shoulder Exercises: Seated   Flexion AAROM;10 reps   Flexion Limitations with ball x 10 sec hold   Shoulder Exercises: Sidelying   External Rotation Strengthening;Left;15 reps;Weights   External Rotation Weight (lbs) 1#   ABduction Strengthening;Left;15 reps;Weights   ABduction Weight (lbs) 1#   ABduction Limitations to 90   Shoulder Exercises: ROM/Strengthening   UBE (Upper Arm Bike) L1 x 6 min 3' forward/3' backward   Modalities   Modalities Vasopneumatic   Vasopneumatic   Number Minutes Vasopneumatic  15 minutes   Vasopnuematic Location  Shoulder   Vasopneumatic Pressure Low   Vasopneumatic Temperature  max cold                  PT Short Term Goals - 08/27/15 1421    PT SHORT TERM GOAL #1   Title independent with HEP (09/24/15)   Time 4   Period Weeks   Status New   PT SHORT TERM GOAL #2  Title improve L shoulder AROM by 10 degrees each motion for improved function (09/24/15)   Time 4   Period Weeks   Status New   PT SHORT TERM GOAL #3   Title report pain < 7/10 with activity (09/24/15)   Time 4   Period Weeks   Status New           PT Long Term Goals - 08/27/15 1422    PT LONG TERM GOAL #1   Title independent with advanced HEP (10/22/15)   Time 8   Period Weeks   Status New   PT LONG TERM GOAL #2   Title improve AROM to [facility] for improved function (10/22/15)   Time 8   Period Weeks   Status New   PT LONG TERM GOAL #3   Title report pain < 5/10 with activity for improved function (10/22/15)   Time 8   Period Weeks   Status New   PT LONG TERM GOAL #4   Title improve L UE strength to 4/5 for improved function (10/22/15)   Time 8   Period  Weeks   Status New               Plan - 09/03/15 1340    Clinical Impression Statement Pt reports compliance with HEP, needed min cues for correct technique.  Able to tolerate initiation of strengthening exercises today.   PT Next Visit Plan ROM, scap stabilization, strengthening   Consulted and Agree with Plan of Care Patient        Problem List Patient Active Problem List   Diagnosis Date Noted  . Radiculopathy 11/22/2013  . Menometrorrhagia 06/08/2012    Class: Chronic   Laureen Abrahams, PT, DPT 09/03/2015 2:20 PM   [facility] [address]  [place], [place], [address] Phone: [phone]   Fax:  [phone]  Name: Robin Schmidt MRN: [id number] Date of Birth: 02/16/1966

## 2015-09-05 ENCOUNTER — Ambulatory Visit: Payer: Medicare Other | Admitting: Physical Therapy

## 2015-09-05 DIAGNOSIS — M7582 Other shoulder lesions, left shoulder: Secondary | ICD-10-CM | POA: Diagnosis not present

## 2015-09-05 DIAGNOSIS — M25512 Pain in left shoulder: Secondary | ICD-10-CM

## 2015-09-05 DIAGNOSIS — R29898 Other symptoms and signs involving the musculoskeletal system: Secondary | ICD-10-CM

## 2015-09-05 DIAGNOSIS — M25612 Stiffness of left shoulder, not elsewhere classified: Secondary | ICD-10-CM

## 2015-09-05 DIAGNOSIS — R293 Abnormal posture: Secondary | ICD-10-CM

## 2015-09-05 NOTE — Therapy (Signed)
[facility] [address]  [place], [place], [address] Phone: [phone]   Fax:  [phone]  Physical Therapy Treatment  Patient Details  Name: Robin Schmidt MRN: [id number] Date of Birth: 1966/04/06 Referring Provider: Tania Ade  Encounter Date: 09/05/2015      PT End of Session - 09/05/15 1115    Visit Number 3   Number of Visits 16   Date for PT Re-Evaluation 10/22/15   PT Start Time 6812  pt arrived late   PT Stop Time 1130   PT Time Calculation (min) 50 min   Activity Tolerance Patient tolerated treatment well;No increased pain   Behavior During Therapy [facility] for tasks assessed/performed      Past Medical History  Diagnosis Date  . Back pain, chronic   . Anxiety   . Depression     Past Surgical History  Procedure Laterality Date  . Back surgery    . Tubal ligation    . Knee arthroscopy    . Back fusion    . Anterior cervical decomp/discectomy fusion N/A 11/22/2013    Procedure: ANTERIOR CERVICAL DECOMPRESSION/DISCECTOMY FUSION 1 LEVEL;  Surgeon: Sinclair Ship, MD;  Location: [place];  Service: Orthopedics;  Laterality: N/A;  Anterior cervical decompression fusion, cervical 5-6 with instrumentation and allograft  . Laparoscopic gastric sleeve resection      There were no vitals filed for this visit.  Visit Diagnosis:  Decreased ROM of left shoulder  Left shoulder pain  Upper extremity weakness  Abnormal posture      Subjective Assessment - 09/05/15 1041    Subjective shoulder is sore today.   Limitations Lifting;House hold activities   Patient Stated Goals decrease pain; perform ADLs    Currently in Pain? Yes   Pain Score 7    Pain Location Shoulder   Pain Orientation Left   Pain Descriptors / Indicators Sore;Aching   Pain Type Surgical pain   Pain Onset 1 to 4 weeks ago   Pain Frequency Constant   Aggravating Factors  movement; activity; sleeping   Pain Relieving  Factors ice, meds                         [facility] Adult PT Treatment/Exercise - 09/05/15 1044    Shoulder Exercises: Pulleys   Flexion 3 minutes   ABduction 3 minutes   Other Pulley Exercises IR x 3 min   Shoulder Exercises: Therapy Ball   Flexion 10 reps   Flexion Limitations on wall x 10 sec hold for ROM   Shoulder Exercises: ROM/Strengthening   UBE (Upper Arm Bike) L1 x 6 min 3' forward/3' backward   Cybex Row Limitations 10# 2x10   Vasopneumatic   Number Minutes Vasopneumatic  15 minutes   Vasopnuematic Location  Shoulder   Vasopneumatic Pressure Medium   Vasopneumatic Temperature  max cold                  PT Short Term Goals - 09/05/15 1116    PT SHORT TERM GOAL #1   Title independent with HEP (09/24/15)   Status On-going   PT SHORT TERM GOAL #2   Title improve L shoulder AROM by 10 degrees each motion for improved function (09/24/15)   Status On-going   PT SHORT TERM GOAL #3   Title report pain < 7/10 with activity (09/24/15)   Status On-going  PT Long Term Goals - 09/05/15 1116    PT LONG TERM GOAL #1   Title independent with advanced HEP (10/22/15)   Status On-going   PT LONG TERM GOAL #2   Title improve AROM to [facility] for improved function (10/22/15)   Status On-going   PT LONG TERM GOAL #3   Title report pain < 5/10 with activity for improved function (10/22/15)   Status On-going   PT LONG TERM GOAL #4   Title improve L UE strength to 4/5 for improved function (10/22/15)   Status On-going               Plan - 09/05/15 1115    Clinical Impression Statement Pain with IR on pulleys but tolerated ROM and strengthening exercises well today.  Will progress strengthening exercises and add to HEP.   PT Next Visit Plan give theraband HEP; ROM, scap stabilization, strengthening   Consulted and Agree with Plan of Care Patient        Problem List Patient Active Problem List   Diagnosis Date Noted  . Radiculopathy  11/22/2013  . Menometrorrhagia 06/08/2012    Class: Chronic   Laureen Abrahams, PT, DPT 09/05/2015 11:30 AM  [facility] [address]  [place], [place], [address] Phone: [phone]   Fax:  [phone]  Name: Robin Schmidt MRN: [id number] Date of Birth: 1966-05-19

## 2015-09-10 ENCOUNTER — Ambulatory Visit: Payer: Medicare Other | Attending: Orthopedic Surgery | Admitting: Physical Therapy

## 2015-09-10 DIAGNOSIS — M25512 Pain in left shoulder: Secondary | ICD-10-CM | POA: Insufficient documentation

## 2015-09-10 DIAGNOSIS — R293 Abnormal posture: Secondary | ICD-10-CM | POA: Insufficient documentation

## 2015-09-10 DIAGNOSIS — M7582 Other shoulder lesions, left shoulder: Secondary | ICD-10-CM | POA: Diagnosis present

## 2015-09-10 DIAGNOSIS — R29898 Other symptoms and signs involving the musculoskeletal system: Secondary | ICD-10-CM | POA: Diagnosis present

## 2015-09-10 DIAGNOSIS — M25612 Stiffness of left shoulder, not elsewhere classified: Secondary | ICD-10-CM

## 2015-09-10 NOTE — Patient Instructions (Signed)
  Strengthening: Resisted Internal Rotation   Hold tubing in left hand, elbow at side and forearm out. Rotate forearm in across body. Repeat __15__ times per set. Do _2___ sets per session. Do _2-3___ sessions per day.  http://orth.exer.us/830   Copyright  VHI. All rights reserved.  Strengthening: Resisted External Rotation   Hold tubing in left hand, elbow at side and forearm across body. Rotate forearm out. Repeat _15___ times per set. Do _2___ sets per session. Do _2-3___ sessions per day.  http://orth.exer.us/828   Copyright  VHI. All rights reserved.  Strengthening: Resisted Flexion   Hold tubing with left arm at side. Pull forward and up. Move shoulder through pain-free range of motion. Repeat _15___ times per set. Do _2___ sets per session. Do __2-3__ sessions per day.  http://orth.exer.us/824   Copyright  VHI. All rights reserved.  Strengthening: Resisted Extension   Hold tubing in left hand, arm forward. Pull arm back, elbow BENT. Repeat __15__ times per set. Do _2___ sets per session. Do __2-3__ sessions per day.  http://orth.exer.us/832   Copyright  VHI. All rights reserved.

## 2015-09-10 NOTE — Therapy (Signed)
[facility] [address]  [place], [place], [address] Phone: [phone]   Fax:  [phone]  Physical Therapy Treatment  Patient Details  Name: Robin Schmidt MRN: [id number] Date of Birth: 1966-02-03 Referring Provider: Tania Ade  Encounter Date: 09/10/2015      PT End of Session - 09/10/15 1334    Visit Number 4   Number of Visits 16   Date for PT Re-Evaluation 10/22/15   PT Start Time 1300   PT Stop Time 0488   PT Time Calculation (min) 48 min   Activity Tolerance Patient tolerated treatment well;No increased pain   Behavior During Therapy [facility] for tasks assessed/performed      Past Medical History  Diagnosis Date  . Back pain, chronic   . Anxiety   . Depression     Past Surgical History  Procedure Laterality Date  . Back surgery    . Tubal ligation    . Knee arthroscopy    . Back fusion    . Anterior cervical decomp/discectomy fusion N/A 11/22/2013    Procedure: ANTERIOR CERVICAL DECOMPRESSION/DISCECTOMY FUSION 1 LEVEL;  Surgeon: Sinclair Ship, MD;  Location: [place];  Service: Orthopedics;  Laterality: N/A;  Anterior cervical decompression fusion, cervical 5-6 with instrumentation and allograft  . Laparoscopic gastric sleeve resection      There were no vitals filed for this visit.  Visit Diagnosis:  Decreased ROM of left shoulder  Left shoulder pain  Upper extremity weakness  Abnormal posture      Subjective Assessment - 09/10/15 1302    Subjective Raked leaves today.  L shoulder has been sore all weekend.   Patient Stated Goals decrease pain; perform ADLs    Currently in Pain? Yes   Pain Score 8    Pain Location Shoulder   Pain Orientation Left   Pain Descriptors / Indicators Aching;Sore   Pain Type Surgical pain   Pain Onset More than a month ago   Pain Frequency Constant   Aggravating Factors  movement; activity; sleeping   Pain Relieving Factors ice, meds                          [facility] Adult PT Treatment/Exercise - 09/10/15 1303    Shoulder Exercises: Supine   Horizontal ABduction Both;10 reps;Theraband   Theraband Level (Shoulder Horizontal ABduction) Level 2 (Red)   Horizontal ABduction Limitations on 1/2 foam roll   Flexion Left;10 reps;Theraband   Theraband Level (Shoulder Flexion) Level 2 (Red)   Shoulder Exercises: Standing   External Rotation Strengthening;Left;15 reps;Theraband;Limitations   Theraband Level (Shoulder External Rotation) Level 2 (Red)   External Rotation Limitations 2 sets   Internal Rotation Left;15 reps;Theraband   Theraband Level (Shoulder Internal Rotation) Level 2 (Red)   Internal Rotation Limitations 2 sets   Flexion Left;15 reps;Theraband   Theraband Level (Shoulder Flexion) Level 2 (Red)   Flexion Limitations 2 sets   Retraction Left;15 reps;Theraband   Theraband Level (Shoulder Retraction) Level 2 (Red)   Retraction Limitations 2 sets   Shoulder Exercises: ROM/Strengthening   UBE (Upper Arm Bike) L 2.5 x 8 min (4 min forward/4 min backward)   Cybex Row Limitations 15# 2x15   Shoulder Exercises: Stretch   Other Shoulder Stretches supine on 1/2 foam roll x 2 min   Vasopneumatic   Number Minutes Vasopneumatic  15 minutes   Vasopnuematic Location  Shoulder   Vasopneumatic Pressure Medium  Vasopneumatic Temperature  max cold                PT Education - 09/10/15 1333    Education provided Yes   Education Details theraband HEP   Person(s) Educated Patient   Methods Explanation;Demonstration;Handout   Comprehension Verbalized understanding;Returned demonstration;Need further instruction          PT Short Term Goals - 09/05/15 1116    PT SHORT TERM GOAL #1   Title independent with HEP (09/24/15)   Status On-going   PT SHORT TERM GOAL #2   Title improve L shoulder AROM by 10 degrees each motion for improved function (09/24/15)   Status On-going   PT SHORT TERM GOAL #3    Title report pain < 7/10 with activity (09/24/15)   Status On-going           PT Long Term Goals - 09/05/15 1116    PT LONG TERM GOAL #1   Title independent with advanced HEP (10/22/15)   Status On-going   PT LONG TERM GOAL #2   Title improve AROM to [facility] for improved function (10/22/15)   Status On-going   PT LONG TERM GOAL #3   Title report pain < 5/10 with activity for improved function (10/22/15)   Status On-going   PT LONG TERM GOAL #4   Title improve L UE strength to 4/5 for improved function (10/22/15)   Status On-going               Plan - 09/10/15 1334    Clinical Impression Statement Pain rated 8/10 however pt able to perform yardwork today.  Overall able to perform all exercises without increase in pain and progressing with ROM.     PT Next Visit Plan ROM, scap stabilization, strengthening; measure ROM   PT Home Exercise Plan supine cane; rockwood 4   Consulted and Agree with Plan of Care Patient        Problem List Patient Active Problem List   Diagnosis Date Noted  . Radiculopathy 11/22/2013  . Menometrorrhagia 06/08/2012    Class: Chronic   Laureen Abrahams, PT, DPT 09/10/2015 1:49 PM  [facility] [address]  [place], [place], [address] Phone: [phone]   Fax:  [phone]  Name: Robin Schmidt MRN: [id number] Date of Birth: 1966-07-02

## 2015-09-12 ENCOUNTER — Ambulatory Visit: Payer: Medicare Other | Admitting: Physical Therapy

## 2015-09-12 DIAGNOSIS — R29898 Other symptoms and signs involving the musculoskeletal system: Secondary | ICD-10-CM

## 2015-09-12 DIAGNOSIS — M25512 Pain in left shoulder: Secondary | ICD-10-CM

## 2015-09-12 DIAGNOSIS — R293 Abnormal posture: Secondary | ICD-10-CM

## 2015-09-12 DIAGNOSIS — M25612 Stiffness of left shoulder, not elsewhere classified: Secondary | ICD-10-CM

## 2015-09-12 DIAGNOSIS — M7582 Other shoulder lesions, left shoulder: Secondary | ICD-10-CM | POA: Diagnosis not present

## 2015-09-12 NOTE — Therapy (Signed)
[facility] [address]  [place], [place], [address] Phone: [phone]   Fax:  [phone]  Physical Therapy Treatment  Patient Details  Name: Robin Schmidt MRN: [id number] Date of Birth: 05-Dec-1965 Referring Provider: Tania Ade  Encounter Date: 09/12/2015      PT End of Session - 09/12/15 1343    Visit Number 5   Number of Visits 16   Date for PT Re-Evaluation 10/22/15   PT Start Time 6720  pt arrived late   PT Stop Time 1357   PT Time Calculation (min) 48 min   Activity Tolerance Patient tolerated treatment well;No increased pain   Behavior During Therapy [facility] for tasks assessed/performed      Past Medical History  Diagnosis Date  . Back pain, chronic   . Anxiety   . Depression     Past Surgical History  Procedure Laterality Date  . Back surgery    . Tubal ligation    . Knee arthroscopy    . Back fusion    . Anterior cervical decomp/discectomy fusion N/A 11/22/2013    Procedure: ANTERIOR CERVICAL DECOMPRESSION/DISCECTOMY FUSION 1 LEVEL;  Surgeon: Sinclair Ship, MD;  Location: [place];  Service: Orthopedics;  Laterality: N/A;  Anterior cervical decompression fusion, cervical 5-6 with instrumentation and allograft  . Laparoscopic gastric sleeve resection      There were no vitals filed for this visit.  Visit Diagnosis:  Decreased ROM of left shoulder  Left shoulder pain  Upper extremity weakness  Abnormal posture      Subjective Assessment - 09/12/15 1311    Subjective Shoulder is sore.    Patient Stated Goals decrease pain; perform ADLs    Currently in Pain? Yes   Pain Score 7   "7.5"   Pain Location Shoulder   Pain Orientation Left   Pain Descriptors / Indicators Aching;Sore   Pain Type Surgical pain   Pain Onset More than a month ago   Pain Frequency Constant            [facility] PT Assessment - 09/12/15 1318    AROM   Left Shoulder Flexion 118 Degrees   Left  Shoulder ABduction 87 Degrees                     [facility] Adult PT Treatment/Exercise - 09/12/15 1312    Shoulder Exercises: Supine   Flexion Both;20 reps;Weights   Shoulder Flexion Weight (lbs) 2   Flexion Limitations with cane   Other Supine Exercises chest press with cane 2# 2x10   Shoulder Exercises: Sidelying   External Rotation Strengthening;Left;20 reps;Weights   External Rotation Weight (lbs) 2#   ABduction Strengthening;Left;20 reps;Weights   ABduction Weight (lbs) 1#   Shoulder Exercises: Pulleys   Flexion 3 minutes   ABduction 3 minutes   Other Pulley Exercises IR x 3 min   Shoulder Exercises: ROM/Strengthening   UBE (Upper Arm Bike) L 3 x 6 min; 3 min forward/3 min backward   Vasopneumatic   Number Minutes Vasopneumatic  15 minutes   Vasopnuematic Location  Shoulder   Vasopneumatic Pressure Medium   Vasopneumatic Temperature  max cold   Manual Therapy   Manual Therapy Joint mobilization;Passive ROM   Joint Mobilization Inferior L shoulder grade 2-3 for increased abduction   Passive ROM L shoulder ir and abdct                  PT Short Term  Goals - 09/05/15 1116    PT SHORT TERM GOAL #1   Title independent with HEP (09/24/15)   Status On-going   PT SHORT TERM GOAL #2   Title improve L shoulder AROM by 10 degrees each motion for improved function (09/24/15)   Status On-going   PT SHORT TERM GOAL #3   Title report pain < 7/10 with activity (09/24/15)   Status On-going           PT Long Term Goals - 09/05/15 1116    PT LONG TERM GOAL #1   Title independent with advanced HEP (10/22/15)   Status On-going   PT LONG TERM GOAL #2   Title improve AROM to [facility] for improved function (10/22/15)   Status On-going   PT LONG TERM GOAL #3   Title report pain < 5/10 with activity for improved function (10/22/15)   Status On-going   PT LONG TERM GOAL #4   Title improve L UE strength to 4/5 for improved function (10/22/15)   Status On-going                Plan - 09/12/15 1344    Clinical Impression Statement AROM L shoulder flexion and abduction improved.  Pt slowly progressing towards goals.   PT Next Visit Plan ROM, scap stabilization, strengthening; measure ROM   Consulted and Agree with Plan of Care Patient        Problem List Patient Active Problem List   Diagnosis Date Noted  . Radiculopathy 11/22/2013  . Menometrorrhagia 06/08/2012    Class: Chronic   Laureen Abrahams, PT, DPT 09/12/2015 1:58 PM  [facility] [address]  [place], [place], [address] Phone: [phone]   Fax:  [phone]  Name: PARMINDER TRAPANI MRN: [id number] Date of Birth: [birth year]

## 2015-09-17 ENCOUNTER — Ambulatory Visit: Payer: Medicare Other

## 2015-09-19 ENCOUNTER — Ambulatory Visit: Payer: Medicare Other | Admitting: Physical Therapy

## 2015-09-19 DIAGNOSIS — R29898 Other symptoms and signs involving the musculoskeletal system: Secondary | ICD-10-CM

## 2015-09-19 DIAGNOSIS — M25612 Stiffness of left shoulder, not elsewhere classified: Secondary | ICD-10-CM

## 2015-09-19 DIAGNOSIS — M7582 Other shoulder lesions, left shoulder: Secondary | ICD-10-CM | POA: Diagnosis not present

## 2015-09-19 DIAGNOSIS — R293 Abnormal posture: Secondary | ICD-10-CM

## 2015-09-19 DIAGNOSIS — M25512 Pain in left shoulder: Secondary | ICD-10-CM

## 2015-09-19 NOTE — Therapy (Signed)
[facility] [address]  [place], [place], [address] Phone: [phone]   Fax:  [phone]  Physical Therapy Treatment  Patient Details  Name: Robin Schmidt MRN: [id number] Date of Birth: [birth year] Referring Provider: Tania Ade  Encounter Date: 09/19/2015      PT End of Session - 09/19/15 1343    Visit Number 6   Number of Visits 16   Date for PT Re-Evaluation 10/22/15   PT Start Time [id number]  pt arrived late   PT Stop Time 1357   PT Time Calculation (min) 50 min   Activity Tolerance Patient tolerated treatment well;No increased pain   Behavior During Therapy [facility] for tasks assessed/performed      Past Medical History  Diagnosis Date  . Back pain, chronic   . Anxiety   . Depression     Past Surgical History  Procedure Laterality Date  . Back surgery    . Tubal ligation    . Knee arthroscopy    . Back fusion    . Anterior cervical decomp/discectomy fusion N/A 11/22/2013    Procedure: ANTERIOR CERVICAL DECOMPRESSION/DISCECTOMY FUSION 1 LEVEL;  Surgeon: Sinclair Ship, MD;  Location: [place];  Service: Orthopedics;  Laterality: N/A;  Anterior cervical decompression fusion, cervical 5-6 with instrumentation and allograft  . Laparoscopic gastric sleeve resection      There were no vitals filed for this visit.  Visit Diagnosis:  Decreased ROM of left shoulder  Left shoulder pain  Upper extremity weakness  Abnormal posture      Subjective Assessment - 09/19/15 1310    Subjective Shoulder continues to be sore; went to MD yesterday.  MD pleased with progress.  Pt reports still stiff.   Patient Stated Goals decrease pain; perform ADLs    Currently in Pain? Yes   Pain Score 8    Pain Location Shoulder   Pain Orientation Left   Pain Descriptors / Indicators Aching;Sore   Pain Type Surgical pain   Pain Onset More than a month ago   Pain Frequency Constant   Aggravating Factors  movement,  activity, sleeping   Pain Relieving Factors ice, meds                         [facility] Adult PT Treatment/Exercise - 09/19/15 1311    Shoulder Exercises: Supine   Protraction Left;20 reps;Weights   Protraction Weight (lbs) 3   Flexion Left;20 reps;Weights   Shoulder Flexion Weight (lbs) 2   Shoulder Exercises: Sidelying   External Rotation Strengthening;Left;20 reps;Weights   External Rotation Weight (lbs) 2#   ABduction Strengthening;Left;20 reps;Weights   ABduction Weight (lbs) 2   Shoulder Exercises: Pulleys   Flexion 3 minutes   ABduction 3 minutes   Other Pulley Exercises IR x 3 min   Shoulder Exercises: Therapy Ball   Flexion 10 reps   Flexion Limitations on wall x 10 sec hold for ROM   Shoulder Exercises: ROM/Strengthening   UBE (Upper Arm Bike) L 3 x 6 min; 3 min forward/3 min backward   Vasopneumatic   Number Minutes Vasopneumatic  15 minutes   Vasopnuematic Location  Shoulder   Vasopneumatic Pressure Medium   Vasopneumatic Temperature  max cold                  PT Short Term Goals - 09/05/15 1116    PT SHORT TERM GOAL #1   Title independent with HEP (  09/24/15)   Status On-going   PT SHORT TERM GOAL #2   Title improve L shoulder AROM by 10 degrees each motion for improved function (09/24/15)   Status On-going   PT SHORT TERM GOAL #3   Title report pain < 7/10 with activity (09/24/15)   Status On-going           PT Long Term Goals - 09/05/15 1116    PT LONG TERM GOAL #1   Title independent with advanced HEP (10/22/15)   Status On-going   PT LONG TERM GOAL #2   Title improve AROM to [facility] for improved function (10/22/15)   Status On-going   PT LONG TERM GOAL #3   Title report pain < 5/10 with activity for improved function (10/22/15)   Status On-going   PT LONG TERM GOAL #4   Title improve L UE strength to 4/5 for improved function (10/22/15)   Status On-going               Plan - 09/19/15 1343    Clinical Impression  Statement Pt c/o increased stiffness but overall progressing well.  Strength slowly improving.   PT Next Visit Plan ROM, scap stabilization, strengthening; measure external rotation   PT Home Exercise Plan supine cane; rockwood 4   Consulted and Agree with Plan of Care Patient        Problem List Patient Active Problem List   Diagnosis Date Noted  . Radiculopathy 11/22/2013  . Menometrorrhagia 06/08/2012    Class: Chronic   Laureen Abrahams, PT, DPT 09/19/2015 2:28 PM  [facility] [address]  [place], [place], [address] Phone: [phone]   Fax:  [phone]  Name: Robin Schmidt MRN: [id number] Date of Birth: [birth year]

## 2015-09-24 ENCOUNTER — Ambulatory Visit: Payer: Medicare Other | Admitting: Physical Therapy

## 2015-09-24 DIAGNOSIS — R29898 Other symptoms and signs involving the musculoskeletal system: Secondary | ICD-10-CM

## 2015-09-24 DIAGNOSIS — M25612 Stiffness of left shoulder, not elsewhere classified: Secondary | ICD-10-CM

## 2015-09-24 DIAGNOSIS — R293 Abnormal posture: Secondary | ICD-10-CM

## 2015-09-24 DIAGNOSIS — M7582 Other shoulder lesions, left shoulder: Secondary | ICD-10-CM | POA: Diagnosis not present

## 2015-09-24 DIAGNOSIS — M25512 Pain in left shoulder: Secondary | ICD-10-CM

## 2015-09-24 NOTE — Therapy (Signed)
[facility] [address]  [place], [place], [address] Phone: [phone]   Fax:  [phone]  Physical Therapy Treatment  Patient Details  Name: AUDREY ALONZO MRN: [id number] Date of Birth: 05-Feb-1966 Referring Provider: Tania Ade  Encounter Date: 09/24/2015      PT End of Session - 09/24/15 1339    Visit Number 7   Number of Visits 16   Date for PT Re-Evaluation 10/22/15   PT Start Time [id number]   PT Stop Time 1357   PT Time Calculation (min) 54 min   Activity Tolerance Patient tolerated treatment well;No increased pain   Behavior During Therapy [facility] for tasks assessed/performed      Past Medical History  Diagnosis Date  . Back pain, chronic   . Anxiety   . Depression     Past Surgical History  Procedure Laterality Date  . Back surgery    . Tubal ligation    . Knee arthroscopy    . Back fusion    . Anterior cervical decomp/discectomy fusion N/A 11/22/2013    Procedure: ANTERIOR CERVICAL DECOMPRESSION/DISCECTOMY FUSION 1 LEVEL;  Surgeon: Sinclair Ship, MD;  Location: [place];  Service: Orthopedics;  Laterality: N/A;  Anterior cervical decompression fusion, cervical 5-6 with instrumentation and allograft  . Laparoscopic gastric sleeve resection      There were no vitals filed for this visit.  Visit Diagnosis:  Decreased ROM of left shoulder  Left shoulder pain  Upper extremity weakness  Abnormal posture      Subjective Assessment - 09/24/15 1306    Subjective Tired today; L shoulder continues to be sore and painful.   Patient Stated Goals decrease pain; perform ADLs    Currently in Pain? Yes   Pain Score 7   pain at lowest 6/10   Pain Location Shoulder   Pain Orientation Left   Pain Descriptors / Indicators Aching;Sore   Pain Type Surgical pain   Pain Onset More than a month ago   Pain Frequency Constant   Aggravating Factors  activity   Pain Relieving Factors ice, meds             [facility] PT Assessment - 09/24/15 1320    AROM   Left Shoulder Flexion 145 Degrees   Left Shoulder Internal Rotation 64 Degrees   Left Shoulder External Rotation 55 Degrees                     [facility] Adult PT Treatment/Exercise - 09/24/15 1307    Shoulder Exercises: Supine   Flexion Both;15 reps;Weights   Shoulder Flexion Weight (lbs) 3   Flexion Limitations with cane   Other Supine Exercises chest press with cane 3# x15   Shoulder Exercises: Pulleys   Flexion 3 minutes   ABduction 3 minutes   Other Pulley Exercises IR x 3 min   Shoulder Exercises: ROM/Strengthening   UBE (Upper Arm Bike) L 3 x 6 min; 3 min forward/3 min backward   Vasopneumatic   Number Minutes Vasopneumatic  15 minutes   Vasopnuematic Location  Shoulder   Vasopneumatic Pressure Medium   Vasopneumatic Temperature  max cold   Manual Therapy   Manual Therapy Passive ROM   Passive ROM L shoulder IR/ER/flexion                  PT Short Term Goals - 09/24/15 1341    PT SHORT TERM GOAL #1   Title independent with  HEP (09/24/15)   Status Achieved   PT SHORT TERM GOAL #2   Title improve L shoulder AROM by 10 degrees each motion for improved function (09/24/15)   Status Achieved   PT SHORT TERM GOAL #3   Title report pain < 7/10 with activity (09/24/15)   Status On-going           PT Long Term Goals - 09/05/15 1116    PT LONG TERM GOAL #1   Title independent with advanced HEP (10/22/15)   Status On-going   PT LONG TERM GOAL #2   Title improve AROM to [facility] for improved function (10/22/15)   Status On-going   PT LONG TERM GOAL #3   Title report pain < 5/10 with activity for improved function (10/22/15)   Status On-going   PT LONG TERM GOAL #4   Title improve L UE strength to 4/5 for improved function (10/22/15)   Status On-going               Plan - 09/24/15 1340    Clinical Impression Statement Pt's AROM continues to improve (flexion 145, ir 64 and er 34)  and demonstrating functional progress.  Continues to report high levels of pain, but tolerates exercises well and is increasing activity at home.   PT Next Visit Plan ROM, scap stabilization, strengthening   Consulted and Agree with Plan of Care Patient        Problem List Patient Active Problem List   Diagnosis Date Noted  . Radiculopathy 11/22/2013  . Menometrorrhagia 06/08/2012    Class: Chronic   Laureen Abrahams, PT, DPT 09/24/2015 2:31 PM  [facility] [address]  [place], [place], [address] Phone: [phone]   Fax:  [phone]  Name: CYNDEE SUNDBY MRN: [id number] Date of Birth: 1966-06-04

## 2015-09-26 ENCOUNTER — Ambulatory Visit: Payer: Medicare Other

## 2015-10-08 ENCOUNTER — Ambulatory Visit: Payer: Medicare Other | Admitting: Physical Therapy

## 2015-10-08 DIAGNOSIS — R29898 Other symptoms and signs involving the musculoskeletal system: Secondary | ICD-10-CM

## 2015-10-08 DIAGNOSIS — R293 Abnormal posture: Secondary | ICD-10-CM

## 2015-10-08 DIAGNOSIS — M25512 Pain in left shoulder: Secondary | ICD-10-CM

## 2015-10-08 DIAGNOSIS — M7582 Other shoulder lesions, left shoulder: Secondary | ICD-10-CM | POA: Diagnosis not present

## 2015-10-08 DIAGNOSIS — M25612 Stiffness of left shoulder, not elsewhere classified: Secondary | ICD-10-CM

## 2015-10-08 NOTE — Therapy (Signed)
[facility] [address]  [place], [place], [address] Phone: [phone]   Fax:  [phone]  Physical Therapy Treatment  Patient Details  Name: Robin Schmidt MRN: [id number] Date of Birth: 1966-01-14 Referring Provider: Tania Ade  Encounter Date: 10/08/2015      PT End of Session - 10/08/15 1339    Visit Number 8   Number of Visits 16   Date for PT Re-Evaluation 10/22/15   PT Start Time [id number]  pt arrived late   PT Stop Time 1356   PT Time Calculation (min) 49 min   Activity Tolerance Patient tolerated treatment well;No increased pain   Behavior During Therapy [facility] for tasks assessed/performed      Past Medical History  Diagnosis Date  . Back pain, chronic   . Anxiety   . Depression     Past Surgical History  Procedure Laterality Date  . Back surgery    . Tubal ligation    . Knee arthroscopy    . Back fusion    . Anterior cervical decomp/discectomy fusion N/A 11/22/2013    Procedure: ANTERIOR CERVICAL DECOMPRESSION/DISCECTOMY FUSION 1 LEVEL;  Surgeon: Sinclair Ship, MD;  Location: [place];  Service: Orthopedics;  Laterality: N/A;  Anterior cervical decompression fusion, cervical 5-6 with instrumentation and allograft  . Laparoscopic gastric sleeve resection      There were no vitals filed for this visit.  Visit Diagnosis:  Decreased ROM of left shoulder  Left shoulder pain  Upper extremity weakness  Abnormal posture      Subjective Assessment - 10/08/15 1311    Subjective L shoulder has been stiff but doing well.   Patient Stated Goals decrease pain; perform ADLs    Currently in Pain? Yes   Pain Score 7    Pain Location Shoulder   Pain Orientation Left   Pain Descriptors / Indicators Aching;Sore   Pain Type Surgical pain   Pain Onset More than a month ago   Pain Frequency Constant   Aggravating Factors  activity   Pain Relieving Factors ice, meds                          [facility] Adult PT Treatment/Exercise - 10/08/15 1312    Shoulder Exercises: Supine   Flexion Left;20 reps;Weights   Shoulder Flexion Weight (lbs) 3   Shoulder Exercises: Seated   Flexion Limitations with table 10x5-10 sec hold for ROM   Abduction Left;10 reps;AAROM   ABduction Limitations with table for ROM   Other Seated Exercises scaption with table 10x5-10 sec hold   Shoulder Exercises: Sidelying   External Rotation Strengthening;Left;20 reps;Weights   External Rotation Weight (lbs) 3   ABduction Strengthening;Left;20 reps;Weights   ABduction Weight (lbs) 3   Shoulder Exercises: ROM/Strengthening   UBE (Upper Arm Bike) L 3 x 6 min; 3 min forward/3 min backward   Vasopneumatic   Number Minutes Vasopneumatic  15 minutes   Vasopnuematic Location  Shoulder   Vasopneumatic Pressure Medium   Vasopneumatic Temperature  max cold                  PT Short Term Goals - 09/24/15 1341    PT SHORT TERM GOAL #1   Title independent with HEP (09/24/15)   Status Achieved   PT SHORT TERM GOAL #2   Title improve L shoulder AROM by 10 degrees each motion for improved function (09/24/15)   Status  Achieved   PT SHORT TERM GOAL #3   Title report pain < 7/10 with activity (09/24/15)   Status On-going           PT Long Term Goals - 09/05/15 1116    PT LONG TERM GOAL #1   Title independent with advanced HEP (10/22/15)   Status On-going   PT LONG TERM GOAL #2   Title improve AROM to [facility] for improved function (10/22/15)   Status On-going   PT LONG TERM GOAL #3   Title report pain < 5/10 with activity for improved function (10/22/15)   Status On-going   PT LONG TERM GOAL #4   Title improve L UE strength to 4/5 for improved function (10/22/15)   Status On-going               Plan - 10/08/15 1339    Clinical Impression Statement Pt continues to report high pain levels but overall function improving.  Reports difficulty with forward  and lateral reaching as well as hooking bra.  Will continue to benefit from PT to maximize function.   PT Next Visit Plan ROM, scap stabilization, strengthening   PT Home Exercise Plan supine cane; rockwood 4   Consulted and Agree with Plan of Care Patient        Problem List Patient Active Problem List   Diagnosis Date Noted  . Radiculopathy 11/22/2013  . Menometrorrhagia 06/08/2012    Class: Chronic   Laureen Abrahams, PT, DPT 10/08/2015 2:58 PM  [facility] [address]  [place], [place], [address] Phone: [phone]   Fax:  [phone]  Name: Robin Schmidt MRN: [id number] Date of Birth: June 09, 1966

## 2015-10-10 ENCOUNTER — Ambulatory Visit: Payer: Medicare Other | Attending: Orthopedic Surgery | Admitting: Physical Therapy

## 2015-10-10 DIAGNOSIS — M7582 Other shoulder lesions, left shoulder: Secondary | ICD-10-CM | POA: Diagnosis present

## 2015-10-10 DIAGNOSIS — R29898 Other symptoms and signs involving the musculoskeletal system: Secondary | ICD-10-CM

## 2015-10-10 DIAGNOSIS — R293 Abnormal posture: Secondary | ICD-10-CM | POA: Insufficient documentation

## 2015-10-10 DIAGNOSIS — M25512 Pain in left shoulder: Secondary | ICD-10-CM | POA: Diagnosis present

## 2015-10-10 DIAGNOSIS — M25612 Stiffness of left shoulder, not elsewhere classified: Secondary | ICD-10-CM

## 2015-10-10 NOTE — Therapy (Signed)
[facility] [address]  [place], [place], [address] Phone: [phone]   Fax:  [phone]  Physical Therapy Treatment  Patient Details  Name: Robin Schmidt MRN: [id number] Date of Birth: 08/09/1966 Referring Provider: Tania Ade  Encounter Date: 10/10/2015      PT End of Session - 10/10/15 1341    Visit Number 9   Number of Visits 16   Date for PT Re-Evaluation 10/22/15   PT Start Time [id number]   PT Stop Time 1353   PT Time Calculation (min) 48 min   Activity Tolerance Patient tolerated treatment well   Behavior During Therapy [facility] for tasks assessed/performed      Past Medical History  Diagnosis Date  . Back pain, chronic   . Anxiety   . Depression     Past Surgical History  Procedure Laterality Date  . Back surgery    . Tubal ligation    . Knee arthroscopy    . Back fusion    . Anterior cervical decomp/discectomy fusion N/A 11/22/2013    Procedure: ANTERIOR CERVICAL DECOMPRESSION/DISCECTOMY FUSION 1 LEVEL;  Surgeon: Sinclair Ship, MD;  Location: [place];  Service: Orthopedics;  Laterality: N/A;  Anterior cervical decompression fusion, cervical 5-6 with instrumentation and allograft  . Laparoscopic gastric sleeve resection      There were no vitals filed for this visit.  Visit Diagnosis:  Decreased ROM of left shoulder  Left shoulder pain  Upper extremity weakness  Abnormal posture      Subjective Assessment - 10/10/15 1307    Subjective feels like stretches are helping   Patient Stated Goals decrease pain; perform ADLs    Currently in Pain? Yes   Pain Score 6    Pain Location Shoulder   Pain Orientation Left   Pain Descriptors / Indicators Aching;Sore   Pain Type Surgical pain   Pain Onset More than a month ago   Pain Frequency Constant   Aggravating Factors  activity   Pain Relieving Factors ice, meds                         [facility] Adult PT  Treatment/Exercise - 10/10/15 1310    Shoulder Exercises: Seated   Flexion Limitations with table 10x5-10 sec hold for ROM   Abduction Left;10 reps;AAROM   ABduction Limitations with table for ROM   Other Seated Exercises scaption with table 10x5-10 sec hold   Shoulder Exercises: Standing   Flexion Left;20 reps;Weights   Shoulder Flexion Weight (lbs) 2   Flexion Limitations from counter to cabinet height   ABduction Left;20 reps;Weights   Shoulder ABduction Weight (lbs) 2   ABduction Limitations from counter to cabinet height   Shoulder Exercises: ROM/Strengthening   UBE (Upper Arm Bike) L 3 x 6 min; 3 min forward/3 min backward   Wall Wash 2# laterally/up and down/rotation x 10 each direction   Ball on Wall with red medicine ball: up/down, laterally and rotation x 10 each direction   Rhythmic Stabilization, Supine with 3# 3x20 sec   Shoulder Exercises: Stretch   Internal Rotation Stretch 10 seconds   Internal Rotation Stretch Limitations 10 reps with towel   Vasopneumatic   Number Minutes Vasopneumatic  15 minutes   Vasopnuematic Location  Shoulder   Vasopneumatic Pressure Medium   Vasopneumatic Temperature  max cold  PT Short Term Goals - 09/24/15 1341    PT SHORT TERM GOAL #1   Title independent with HEP (09/24/15)   Status Achieved   PT SHORT TERM GOAL #2   Title improve L shoulder AROM by 10 degrees each motion for improved function (09/24/15)   Status Achieved   PT SHORT TERM GOAL #3   Title report pain < 7/10 with activity (09/24/15)   Status On-going           PT Long Term Goals - 09/05/15 1116    PT LONG TERM GOAL #1   Title independent with advanced HEP (10/22/15)   Status On-going   PT LONG TERM GOAL #2   Title improve AROM to [facility] for improved function (10/22/15)   Status On-going   PT LONG TERM GOAL #3   Title report pain < 5/10 with activity for improved function (10/22/15)   Status On-going   PT LONG TERM GOAL #4   Title  improve L UE strength to 4/5 for improved function (10/22/15)   Status On-going               Plan - 10/10/15 1341    PT Next Visit Plan ROM, scap stabilization, strengthening        Problem List Patient Active Problem List   Diagnosis Date Noted  . Radiculopathy 11/22/2013  . Menometrorrhagia 06/08/2012    Class: Chronic   Laureen Abrahams, PT, DPT 10/10/2015 1:56 PM  [facility] [address]  [place], [place], [address] Phone: [phone]   Fax:  [phone]  Name: Robin Schmidt MRN: [id number] Date of Birth: 01/04/1966

## 2015-10-17 ENCOUNTER — Ambulatory Visit: Payer: Medicare Other

## 2015-10-22 ENCOUNTER — Ambulatory Visit: Payer: Medicare Other

## 2015-10-24 ENCOUNTER — Ambulatory Visit: Payer: Medicare Other

## 2015-10-29 ENCOUNTER — Ambulatory Visit: Payer: Medicare Other | Admitting: Physical Therapy

## 2015-10-29 DIAGNOSIS — R29898 Other symptoms and signs involving the musculoskeletal system: Secondary | ICD-10-CM

## 2015-10-29 DIAGNOSIS — M7582 Other shoulder lesions, left shoulder: Secondary | ICD-10-CM | POA: Diagnosis not present

## 2015-10-29 DIAGNOSIS — M25612 Stiffness of left shoulder, not elsewhere classified: Secondary | ICD-10-CM

## 2015-10-29 DIAGNOSIS — R293 Abnormal posture: Secondary | ICD-10-CM

## 2015-10-29 DIAGNOSIS — M25512 Pain in left shoulder: Secondary | ICD-10-CM

## 2015-10-29 NOTE — Therapy (Signed)
[facility] [address]  [place], [place], [address] Phone: [phone]   Fax:  [phone]  Physical Therapy Treatment  Patient Details  Name: Robin Schmidt MRN: [id number] Date of Birth: September 23, 1966 Referring Provider: Tania Ade  Encounter Date: 10/29/2015      PT End of Session - 10/29/15 1341    Visit Number 10   Number of Visits 16   Date for PT Re-Evaluation 10/22/15   PT Start Time [id number]  pt arrived late   PT Stop Time 1334   PT Time Calculation (min) 29 min   Activity Tolerance Patient tolerated treatment well   Behavior During Therapy [facility] for tasks assessed/performed      Past Medical History  Diagnosis Date  . Back pain, chronic   . Anxiety   . Depression     Past Surgical History  Procedure Laterality Date  . Back surgery    . Tubal ligation    . Knee arthroscopy    . Back fusion    . Anterior cervical decomp/discectomy fusion N/A 11/22/2013    Procedure: ANTERIOR CERVICAL DECOMPRESSION/DISCECTOMY FUSION 1 LEVEL;  Surgeon: Sinclair Ship, MD;  Location: [place];  Service: Orthopedics;  Laterality: N/A;  Anterior cervical decompression fusion, cervical 5-6 with instrumentation and allograft  . Laparoscopic gastric sleeve resection      There were no vitals filed for this visit.  Visit Diagnosis:  Decreased ROM of left shoulder  Left shoulder pain  Upper extremity weakness  Abnormal posture      Subjective Assessment - 10/29/15 1309    Subjective Doing okay; L shoulder a little sore.  Has missed 2 weeks of appts due to scheduling and mother being sick   Patient Stated Goals decrease pain; perform ADLs    Currently in Pain? Yes   Pain Score 7    Pain Location Shoulder   Pain Orientation Left   Pain Descriptors / Indicators Aching   Pain Type Surgical pain   Pain Onset More than a month ago   Pain Frequency Constant   Aggravating Factors  activity   Pain Relieving  Factors ice, meds            [facility] PT Assessment - 10/29/15 1316    Observation/Other Assessments   Focus on Therapeutic Outcomes (FOTO)  67 (33% limited)   AROM   Left Shoulder Flexion 140 Degrees   Left Shoulder ABduction 119 Degrees   Left Shoulder Internal Rotation 90 Degrees   Left Shoulder External Rotation 57 Degrees   Strength   Right Shoulder Flexion 4/5   Right Shoulder Extension 4+/5   Right Shoulder ABduction 4/5   Right Shoulder Internal Rotation 5/5   Right Shoulder External Rotation 4/5                     [facility] Adult PT Treatment/Exercise - 10/29/15 1311    Self-Care   Self-Care Other Self-Care Comments   Other Self-Care Comments  updated HEP to provide additional strengthening exercises; recommended use of wrist weight during daily activities to help with strengthening; pt verbalized understanding   Shoulder Exercises: ROM/Strengthening   UBE (Upper Arm Bike) L 3 x 6 min; 3 min forward/3 min backward                PT Education - 10/29/15 1341    Education provided Yes   Education Details updated HEP and additional self care   Person(s)  Educated Patient   Methods Explanation;Handout;Demonstration   Comprehension Verbalized understanding;Returned demonstration          PT Short Term Goals - Nov 16, 2015 1342    PT SHORT TERM GOAL #1   Title independent with HEP (09/24/15)   Status Achieved   PT SHORT TERM GOAL #2   Title improve L shoulder AROM by 10 degrees each motion for improved function (09/24/15)   Status Achieved   PT SHORT TERM GOAL #3   Title report pain < 7/10 with activity (09/24/15)   Status Not Met           PT Long Term Goals - 11-16-15 1342    PT LONG TERM GOAL #1   Title independent with advanced HEP (10/22/15)   Status Achieved   PT LONG TERM GOAL #2   Title improve AROM to [facility] for improved function (10/22/15)   Baseline ER and abdct limited but [facility]   Status Achieved   PT LONG TERM GOAL #3   Title  report pain < 5/10 with activity for improved function (10/22/15)   Status Not Met   PT LONG TERM GOAL #4   Title improve L UE strength to 4/5 for improved function (10/22/15)   Status Achieved               Plan - 2015/11/16 1341    Clinical Impression Statement Pt has met all goals except pain.  Overall pt reports no difficulties with functional activities and ready for d/c and transition to home program.   PT Next Visit Plan d/c PT today   Consulted and Agree with Plan of Care Patient          G-Codes - 11-16-2015 1344    Functional Assessment Tool Used FOTO 33% limited   Functional Limitation Carrying, moving and handling objects   Carrying, Moving and Handling Objects Goal Status (S9233) At least 20 percent but less than 40 percent impaired, limited or restricted   Carrying, Moving and Handling Objects Discharge Status [id number]) At least 20 percent but less than 40 percent impaired, limited or restricted      Problem List Patient Active Problem List   Diagnosis Date Noted  . Radiculopathy 11/22/2013  . Menometrorrhagia 06/08/2012    Class: Chronic   Laureen Abrahams, PT, DPT 11/16/2015 1:48 PM   [facility] [address]  [place], [place], [address] Phone: [phone]   Fax:  [phone]  Name: Robin Schmidt MRN: [id number] Date of Birth: [birth year]     PHYSICAL THERAPY DISCHARGE SUMMARY  Visits from Start of Care: 10  Current functional level related to goals / functional outcomes: See above    Remaining deficits: Pt continues to have pain rated 7/10, but overall function unchanged.   Education / Equipment: HEP  Plan: Patient agrees to discharge.  Patient goals were partially met. Patient is being discharged due to being pleased with the current functional level.  ?????   Laureen Abrahams, PT, DPT Nov 16, 2015 1:50 PM  [facility] [place], [place] [address]  [phone] (office) [phone] (fax)

## 2015-10-29 NOTE — Patient Instructions (Signed)
Shoulder Press    Stand or sit with arms bent, hands at shoulder level. Inhale, then while exhaling, extend arms toward ceiling. Hold 1 count. Slowly return to starting position. Repeat _10-15___ times per set. Do __1__ sets per session. Do __2-3__ sessions per week. May be done with dumbbells or soup cans.  Copyright  VHI. All rights reserved.   SHOULDER: Abduction (Weight)    Raise arm out and up. Keep elbow straight. Do not shrug shoulders. Hold __1-2_ seconds. Use _2-3__ lb weight or soup can. _10-15__ reps per set, _2-3__ sets per day, _2-3__ days per week.  Copyright  VHI. All rights reserved.    Curl: Standing    Anchor tubing under front foot in stride stance. Palms forward, curl arms. Repeat _10-15_ times per set. Do _1_ sets per session. Do _2-3_ sessions per week.  http://tub.exer.us/2   Copyright  VHI. All rights reserved.    Press: Standing - Over Head    In stride stance, tubing anchored under back foot, grasp handles behind head. Thumbs down, straighten arms, rotating to palms forward. Repeat _10-15_ times per set. Do _1_ sets per session. Do _2-3_ sessions per week.  http://tub.exer.us/230   Copyright  VHI. All rights reserved.    Weight Exercise: Flexion    Standing, _2___ lb weight or soup can in right hand above face. Arm straight as possible, lower weight beyond head. Repeat _10-15___ times. Do __1-2__ sessions per day. Perform 2-3 times per week.  http://gt2.exer.us/80   Copyright  VHI. All rights reserved.

## 2015-12-23 ENCOUNTER — Encounter (HOSPITAL_BASED_OUTPATIENT_CLINIC_OR_DEPARTMENT_OTHER): Payer: Self-pay | Admitting: *Deleted

## 2015-12-23 ENCOUNTER — Emergency Department (HOSPITAL_BASED_OUTPATIENT_CLINIC_OR_DEPARTMENT_OTHER)
Admission: EM | Admit: 2015-12-23 | Discharge: 2015-12-23 | Disposition: A | Discharge status: Home / Self Care | Payer: Medicare Other | Attending: Emergency Medicine | Admitting: Emergency Medicine

## 2015-12-23 DIAGNOSIS — Z79899 Other long term (current) drug therapy: Secondary | ICD-10-CM | POA: Diagnosis not present

## 2015-12-23 DIAGNOSIS — H5713 Ocular pain, bilateral: Secondary | ICD-10-CM | POA: Diagnosis present

## 2015-12-23 DIAGNOSIS — F329 Major depressive disorder, single episode, unspecified: Secondary | ICD-10-CM | POA: Insufficient documentation

## 2015-12-23 DIAGNOSIS — H109 Unspecified conjunctivitis: Secondary | ICD-10-CM | POA: Diagnosis not present

## 2015-12-23 DIAGNOSIS — F419 Anxiety disorder, unspecified: Secondary | ICD-10-CM | POA: Diagnosis not present

## 2015-12-23 DIAGNOSIS — G8929 Other chronic pain: Secondary | ICD-10-CM | POA: Insufficient documentation

## 2015-12-23 MED ORDER — NAPHAZOLINE-PHENIRAMINE 0.025-0.3 % OP SOLN: 1.0000 [drp] | OPHTHALMIC | Status: DC | PRN

## 2015-12-23 MED ORDER — FLUORESCEIN SODIUM 1 MG OP STRP
1.0000 | ORAL_STRIP | Freq: Once | OPHTHALMIC | Status: AC
  Administered 2015-12-23: 1 via OPHTHALMIC
  Filled 2015-12-23: qty 1

## 2015-12-23 MED ORDER — TETRACAINE HCL 0.5 % OP SOLN
1.0000 [drp] | Freq: Once | OPHTHALMIC | Status: AC
  Administered 2015-12-23: 1 [drp] via OPHTHALMIC
  Filled 2015-12-23: qty 4

## 2015-12-23 NOTE — ED Provider Notes (Signed)
CSN: [id number]     Arrival date & time 12/23/15  1923 History   First MD Initiated Contact with Patient 12/23/15 2118     Chief Complaint  Patient presents with  . Eye Pain    HPI   Robin Schmidt is a 50 y.o. female with a PMH of anxiety and depression who presents to the ED with bilateral eye redness, itching, and clear drainage x 3 days. She denies exacerbating or alleviating factors. She reports recent URI symptoms. She denies fever, chills, eye pain, significant vision changes, photophobia.   Past Medical History  Diagnosis Date  . Back pain, chronic   . Anxiety   . Depression    Past Surgical History  Procedure Laterality Date  . Back surgery    . Tubal ligation    . Knee arthroscopy    . Back fusion    . Anterior cervical decomp/discectomy fusion N/A 11/22/2013    Procedure: ANTERIOR CERVICAL DECOMPRESSION/DISCECTOMY FUSION 1 LEVEL;  Surgeon: Sinclair Ship, MD;  Location: [place];  Service: Orthopedics;  Laterality: N/A;  Anterior cervical decompression fusion, cervical 5-6 with instrumentation and allograft  . Laparoscopic gastric sleeve resection     No family history on file. Social History  Substance Use Topics  . Smoking status: Never Smoker   . Smokeless tobacco: None  . Alcohol Use: No   OB History    No data available      Review of Systems  Constitutional: Negative for fever and chills.  Eyes: Positive for discharge, redness and itching. Negative for photophobia, pain and visual disturbance.      Allergies  Review of patient's allergies indicates no known allergies.  Home Medications   Prior to Admission medications   Medication Sig Start Date End Date Taking? Authorizing Provider  cyclobenzaprine (FLEXERIL) 10 MG tablet Take 10 mg by mouth 3 (three) times daily as needed for muscle spasms.   Yes Historical Provider, MD  DULoxetine (CYMBALTA) 30 MG capsule Take 30 mg by mouth daily.   Yes Historical Provider, MD  gabapentin (NEURONTIN)  300 MG capsule Take 300 mg by mouth 2 (two) times daily.     Yes Historical Provider, MD  ciprofloxacin (CIPRO) 500 MG tablet Take 1 tablet (500 mg total) by mouth 2 (two) times daily. One po bid x 7 days 05/05/14   Malvin Johns, MD  naphazoline-pheniramine (NAPHCON-A) 0.025-0.3 % ophthalmic solution Place 1 drop into both eyes every 4 (four) hours as needed for irritation. 12/23/15   Marella Chimes, PA-C  ondansetron (ZOFRAN ODT) 4 MG disintegrating tablet Take 1 tablet (4 mg total) by mouth every 8 (eight) hours as needed for nausea or vomiting. 03/26/14   Glendell Docker, NP    BP 119/91 mmHg  Pulse 72  Temp(Src) 98.9 F (37.2 C) (Oral)  Resp 18  Ht 5\' 6"  (1.676 m)  Wt 67.132 kg  BMI 23.90 kg/m2  SpO2 100% Physical Exam  Constitutional: She is oriented to person, place, and time. She appears well-developed and well-nourished. No distress.  HENT:  Head: Normocephalic and atraumatic.  Right Ear: External ear normal.  Left Ear: External ear normal.  Nose: Nose normal.  Mouth/Throat: Uvula is midline, oropharynx is clear and moist and mucous membranes are normal. No oropharyngeal exudate, posterior oropharyngeal edema, posterior oropharyngeal erythema or tonsillar abscesses.  Eyes: EOM and lids are normal. Pupils are equal, round, and reactive to light. Right eye exhibits no discharge and no hordeolum. No foreign body present in  the right eye. Left eye exhibits no discharge, no exudate and no hordeolum. No foreign body present in the left eye. Right conjunctiva is injected. Right conjunctiva has no hemorrhage. Left conjunctiva is injected. Left conjunctiva has no hemorrhage. No scleral icterus.  Slit lamp exam:      The right eye shows no corneal abrasion, no corneal flare, no corneal ulcer, no foreign body and no fluorescein uptake.       The left eye shows no corneal abrasion, no corneal flare, no corneal ulcer, no foreign body and no fluorescein uptake.  Mild conjunctival injection  bilaterally. OP 14 OD. IOP 16 OS.  Neck: Normal range of motion. Neck supple.  Cardiovascular: Normal rate and regular rhythm.   Pulmonary/Chest: Effort normal and breath sounds normal. No respiratory distress.  Musculoskeletal: Normal range of motion. She exhibits no edema or tenderness.  Neurological: She is alert and oriented to person, place, and time.  Skin: Skin is warm and dry. She is not diaphoretic.  Psychiatric: She has a normal mood and affect. Her behavior is normal.  Nursing note and vitals reviewed.   ED Course  Procedures (including critical care time)  Labs Review Labs Reviewed - No data to display  Imaging Review No results found.     EKG Interpretation None      MDM   Final diagnoses:  Bilateral conjunctivitis    50 year old female presents with bilateral eye redness, itching, and clear drainage. Reports recent URI symptoms. Denies fever, chills, eye pain, significant vision changes, photophobia. Patient is afebrile. Vital signs stable. Mild conjunctival injection on exam. PERRL. EOMs intact. No increased fluorescein uptake. Symptoms likely due to viral conjunctivitis, do not feel antibiotics are indicated at this time. No evidence of HSV or VSV infection. Patient is not a contact lens wearer. Exam non-concerning for orbital cellulitis, hyphema, corneal ulcers, corneal abrasion, or trauma. Will treat with naphazoline drops every 1 to 2 hours for pruritus. Patient has been instructed to use cool compresses and practice personal hygiene with frequent hand washing. Return precautions discussed. Patient to follow-up with ophthalmology. Patient verbalizes her understanding and is agreeable with plan.   BP 119/91 mmHg  Pulse 72  Temp(Src) 98.9 F (37.2 C) (Oral)  Resp 18  Ht 5\' 6"  (1.676 m)  Wt 67.132 kg  BMI 23.90 kg/m2  SpO2 100%    Marella Chimes, PA-C 12/24/15 0225  Veryl Speak, MD 12/25/15 [phone]

## 2015-12-23 NOTE — ED Notes (Signed)
Pt c/o bilateral eye itching and clear drainage for the last three days.  No abnormalities appreciated upon exam.  Pt recently had a cold.

## 2015-12-23 NOTE — Discharge Instructions (Signed)
1. Medications: naphazoline eye drops, usual home medications 2. Treatment: rest, drink plenty of fluids 3. Follow Up: please followup with your ophthalmologist this week for discussion of your diagnoses and further evaluation after today's visit; if you do not have a primary care doctor use the resource guide provided to find one; please return to the ER for severe eye pain, yellow of green eye discharge, new or worsening symptoms

## 2015-12-23 NOTE — ED Notes (Signed)
Pt c/o bilateral eye redness, pain, drainage, x 3 days. Recent URI

## 2015-12-23 NOTE — ED Notes (Signed)
Pt verbalizes understanding of d/c instructions and denies any further needs at this time. 

## 2016-03-28 ENCOUNTER — Emergency Department (HOSPITAL_BASED_OUTPATIENT_CLINIC_OR_DEPARTMENT_OTHER): Payer: Medicare Other

## 2016-03-28 ENCOUNTER — Emergency Department (HOSPITAL_BASED_OUTPATIENT_CLINIC_OR_DEPARTMENT_OTHER)
Admission: EM | Admit: 2016-03-28 | Discharge: 2016-03-28 | Disposition: A | Discharge status: Home / Self Care | Payer: Medicare Other | Attending: Emergency Medicine | Admitting: Emergency Medicine

## 2016-03-28 ENCOUNTER — Encounter (HOSPITAL_BASED_OUTPATIENT_CLINIC_OR_DEPARTMENT_OTHER): Payer: Self-pay

## 2016-03-28 DIAGNOSIS — J069 Acute upper respiratory infection, unspecified: Secondary | ICD-10-CM | POA: Diagnosis present

## 2016-03-28 DIAGNOSIS — F329 Major depressive disorder, single episode, unspecified: Secondary | ICD-10-CM | POA: Diagnosis not present

## 2016-03-28 MED ORDER — HYDROCOD POLST-CPM POLST ER 10-8 MG/5ML PO SUER: 5.0000 mL | Freq: Two times a day (BID) | ORAL | Status: DC | PRN

## 2016-03-28 MED ORDER — PSEUDOEPHEDRINE-GUAIFENESIN ER 60-600 MG PO TB12: 1.0000 | ORAL_TABLET | Freq: Two times a day (BID) | ORAL | Status: DC

## 2016-03-28 NOTE — ED Provider Notes (Signed)
CSN: [place]:[id number]     Arrival date & time 03/28/16  0413 History   First MD Initiated Contact with Patient 03/28/16 0550     Chief Complaint  Patient presents with  . URI     (Consider location/radiation/quality/duration/timing/severity/associated sxs/prior Treatment) HPI  This is a 50 year old female with a three-day history of URI symptoms. She has had cough productive of green sputum, nasal congestion, postnasal drip, scratchy throat, irritated eyes and mild nausea. Her cough has been severe enough to cause posttussive emesis. She denies fever. She has been taking an over-the-counter antihistamine without relief.  Past Medical History  Diagnosis Date  . Back pain, chronic   . Anxiety   . Depression    Past Surgical History  Procedure Laterality Date  . Back surgery    . Tubal ligation    . Knee arthroscopy    . Back fusion    . Anterior cervical decomp/discectomy fusion N/A 11/22/2013    Procedure: ANTERIOR CERVICAL DECOMPRESSION/DISCECTOMY FUSION 1 LEVEL;  Surgeon: Sinclair Ship, MD;  Location: [facility];  Service: Orthopedics;  Laterality: N/A;  Anterior cervical decompression fusion, cervical 5-6 with instrumentation and allograft  . Laparoscopic gastric sleeve resection     No family history on file. Social History  Substance Use Topics  . Smoking status: Never Smoker   . Smokeless tobacco: None  . Alcohol Use: No   OB History    No data available     Review of Systems  All other systems reviewed and are negative.   Allergies  Review of patient's allergies indicates no known allergies.  Home Medications   Prior to Admission medications   Medication Sig Start Date End Date Taking? Authorizing Provider  ciprofloxacin (CIPRO) 500 MG tablet Take 1 tablet (500 mg total) by mouth 2 (two) times daily. One po bid x 7 days 05/05/14   Malvin Johns, MD  cyclobenzaprine (FLEXERIL) 10 MG tablet Take 10 mg by mouth 3 (three) times daily as needed for muscle spasms.     Historical Provider, MD  DULoxetine (CYMBALTA) 30 MG capsule Take 30 mg by mouth daily.    Historical Provider, MD  gabapentin (NEURONTIN) 300 MG capsule Take 300 mg by mouth 2 (two) times daily.      Historical Provider, MD  naphazoline-pheniramine (NAPHCON-A) 0.025-0.3 % ophthalmic solution Place 1 drop into both eyes every 4 (four) hours as needed for irritation. 12/23/15   Marella Chimes, PA-C  ondansetron (ZOFRAN ODT) 4 MG disintegrating tablet Take 1 tablet (4 mg total) by mouth every 8 (eight) hours as needed for nausea or vomiting. 03/26/14   Glendell Docker, NP   BP 122/91 mmHg  Pulse 90  Temp(Src) 98.2 F (36.8 C) (Oral)  Resp 20  Ht 5\' 6"  (1.676 m)  Wt 150 lb (68.04 kg)  BMI 24.22 kg/m2  SpO2 100%  LMP 03/12/2016   Physical Exam  General: Well-developed, well-nourished female in no acute distress; appearance consistent with age of record HENT: normocephalic; atraumatic; nasal congestion; pharynx normal Eyes: pupils equal, round and reactive to light; extraocular muscles intact Neck: supple Heart: regular rate and rhythm Lungs: clear to auscultation bilaterally; rattly cough Abdomen: soft; nondistended; nontender; no masses or hepatosplenomegaly; bowel sounds present Extremities: No deformity; full range of motion; pulses normal Neurologic: Awake, alert and oriented; motor function intact in all extremities and symmetric; no facial droop Skin: Warm and dry Psychiatric: Normal mood and affect    ED Course  Procedures (including critical care time)  MDM  Nursing notes and vitals signs, including pulse oximetry, reviewed.  Summary of this visit's results, reviewed by myself:  Imaging Studies: Dg Chest 2 View  03/28/2016  CLINICAL DATA:  Cough, congestion for 2 days. EXAM: CHEST  2 VIEW COMPARISON:  Chest radiograph Mar 26, 2014 FINDINGS: Cardiomediastinal silhouette is normal. The lungs are clear without pleural effusions or focal consolidations. Trachea  projects midline and there is no pneumothorax. Soft tissue planes and included osseous structures are non-suspicious. ACDF. IMPRESSION: Normal chest. Electronically Signed   By: Elon Alas M.D.   On: 03/28/2016 05:47        Shanon Rosser, MD 03/28/16 [phone]

## 2016-03-28 NOTE — Discharge Instructions (Signed)
Upper Respiratory Infection, Adult Most upper respiratory infections (URIs) are a viral infection of the air passages leading to the lungs. A URI affects the nose, throat, and upper air passages. The most common type of URI is nasopharyngitis and is typically referred to as "the common cold." URIs run their course and usually go away on their own. Most of the time, a URI does not require medical attention, but sometimes a bacterial infection in the upper airways can follow a viral infection. This is called a secondary infection. Sinus and middle ear infections are common types of secondary upper respiratory infections. Bacterial pneumonia can also complicate a URI. A URI can worsen asthma and chronic obstructive pulmonary disease (COPD). Sometimes, these complications can require emergency medical care and may be life threatening.  CAUSES Almost all URIs are caused by viruses. A virus is a type of germ and can spread from one person to another.  RISKS FACTORS You may be at risk for a URI if:   You smoke.   You have chronic heart or lung disease.  You have a weakened defense (immune) system.   You are very young or very old.   You have nasal allergies or asthma.  You work in crowded or poorly ventilated areas.  You work in health care facilities or schools. SIGNS AND SYMPTOMS  Symptoms typically develop 2-3 days after you come in contact with a cold virus. Most viral URIs last 7-10 days. However, viral URIs from the influenza virus (flu virus) can last 14-18 days and are typically more severe. Symptoms may include:   Runny or stuffy (congested) nose.   Sneezing.   Cough.   Sore throat.   Headache.   Fatigue.   Fever.   Loss of appetite.   Pain in your forehead, behind your eyes, and over your cheekbones (sinus pain).  Muscle aches.  DIAGNOSIS  Your health care provider may diagnose a URI by:  Physical exam.  Tests to check that your symptoms are not due to  another condition such as:  Strep throat.  Sinusitis.  Pneumonia.  Asthma. TREATMENT  A URI goes away on its own with time. It cannot be cured with medicines, but medicines may be prescribed or recommended to relieve symptoms. Medicines may help:  Reduce your fever.  Reduce your cough.  Relieve nasal congestion. HOME CARE INSTRUCTIONS   Take medicines only as directed by your health care provider.   Gargle warm saltwater or take cough drops to comfort your throat as directed by your health care provider.  Use a warm mist humidifier or inhale steam from a shower to increase air moisture. This may make it easier to breathe.  Drink enough fluid to keep your urine clear or pale yellow.   Eat soups and other clear broths and maintain good nutrition.   Rest as needed.   Return to work when your temperature has returned to normal or as your health care provider advises. You may need to stay home longer to avoid infecting others. You can also use a face mask and careful hand washing to prevent spread of the virus.  Increase the usage of your inhaler if you have asthma.   Do not use any tobacco products, including cigarettes, chewing tobacco, or electronic cigarettes. If you need help quitting, ask your health care provider. PREVENTION  The best way to protect yourself from getting a cold is to practice good hygiene.   Avoid oral or hand contact with people with cold   symptoms.   Wash your hands often if contact occurs.  There is no clear evidence that vitamin C, vitamin E, echinacea, or exercise reduces the chance of developing a cold. However, it is always recommended to get plenty of rest, exercise, and practice good nutrition.  SEEK MEDICAL CARE IF:   You are getting worse rather than better.   Your symptoms are not controlled by medicine.   You have chills.  You have worsening shortness of breath.  You have brown or red mucus.  You have yellow or brown nasal  discharge.  You have pain in your face, especially when you bend forward.  You have a fever.  You have swollen neck glands.  You have pain while swallowing.  You have white areas in the back of your throat. SEEK IMMEDIATE MEDICAL CARE IF:   You have severe or persistent:  Headache.  Ear pain.  Sinus pain.  Chest pain.  You have chronic lung disease and any of the following:  Wheezing.  Prolonged cough.  Coughing up blood.  A change in your usual mucus.  You have a stiff neck.  You have changes in your:  Vision.  Hearing.  Thinking.  Mood. MAKE SURE YOU:   Understand these instructions.  Will watch your condition.  Will get help right away if you are not doing well or get worse.   This information is not intended to replace advice given to you by your health care provider. Make sure you discuss any questions you have with your health care provider.   Document Released: 04/21/2001 Document Revised: 03/12/2015 Document Reviewed: 01/31/2014 Elsevier Interactive Patient Education 2016 [employer]  

## 2016-03-28 NOTE — ED Notes (Signed)
Pt verbalizes understanding of d/c instructions and denies any further needs at this time. 

## 2016-03-28 NOTE — ED Notes (Signed)
Pt /o cough, congestion and upper respiratory congestion for the last two days, denies fevers, states she is coughing up green mucous.  Pt also c/o watery, red eyes.

## 2016-05-05 ENCOUNTER — Other Ambulatory Visit (HOSPITAL_BASED_OUTPATIENT_CLINIC_OR_DEPARTMENT_OTHER): Payer: Self-pay | Admitting: Obstetrics and Gynecology

## 2016-05-05 DIAGNOSIS — Z1231 Encounter for screening mammogram for malignant neoplasm of breast: Secondary | ICD-10-CM

## 2016-05-14 ENCOUNTER — Ambulatory Visit (HOSPITAL_BASED_OUTPATIENT_CLINIC_OR_DEPARTMENT_OTHER)
Admission: RE | Admit: 2016-05-14 | Discharge: 2016-05-14 | Disposition: A | Discharge status: Home / Self Care | Payer: Medicare Other | Source: Ambulatory Visit | Attending: Obstetrics and Gynecology | Admitting: Obstetrics and Gynecology

## 2016-05-14 DIAGNOSIS — Z1231 Encounter for screening mammogram for malignant neoplasm of breast: Secondary | ICD-10-CM | POA: Diagnosis present

## 2016-05-20 IMAGING — CR DG CHEST 2V
2 series · 2 of 2 positions shown · non-contrast
Comparison: Chest radiograph March 26, 2014

CLINICAL DATA: Cough, congestion for 2 days.

EXAM:
CHEST  2 VIEW

[w chest pa]
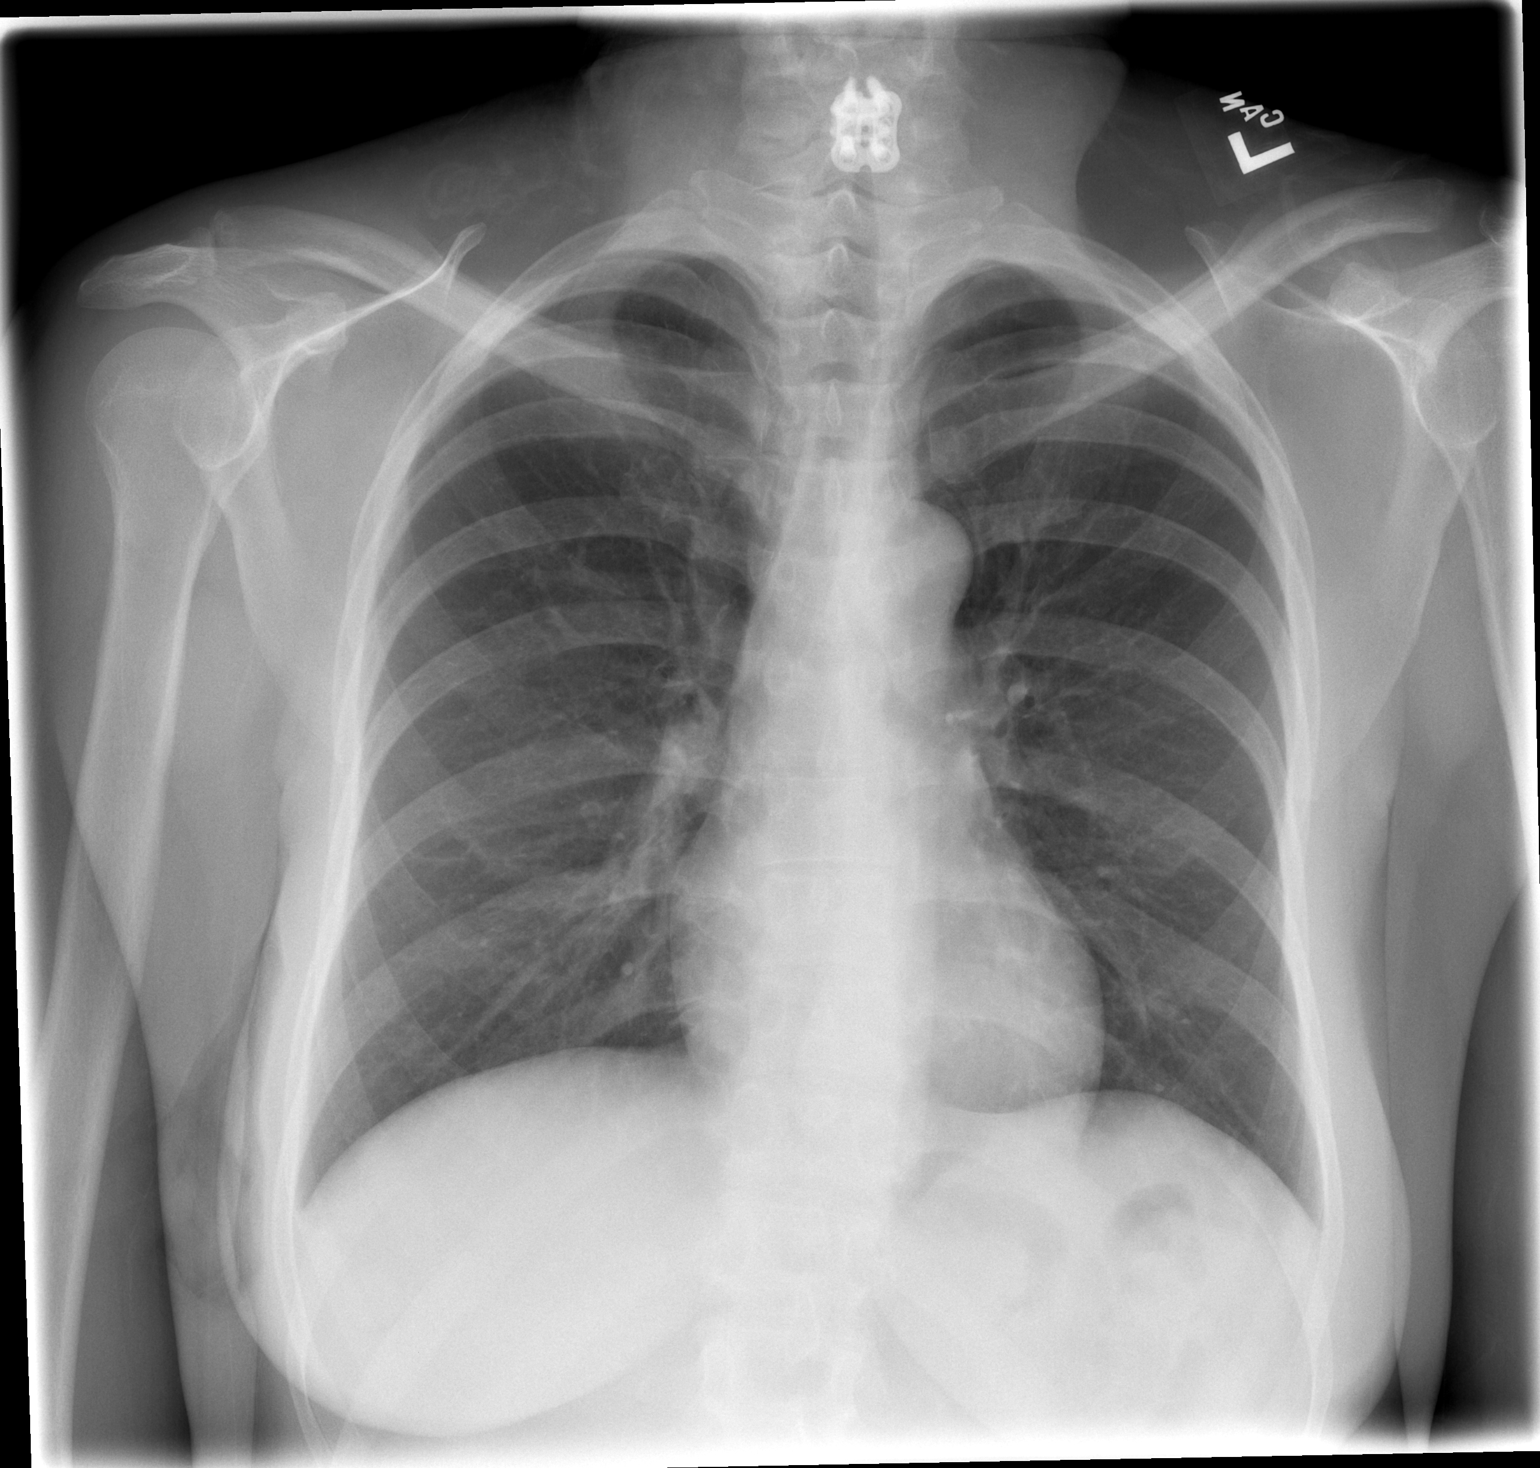

[w chest lat]
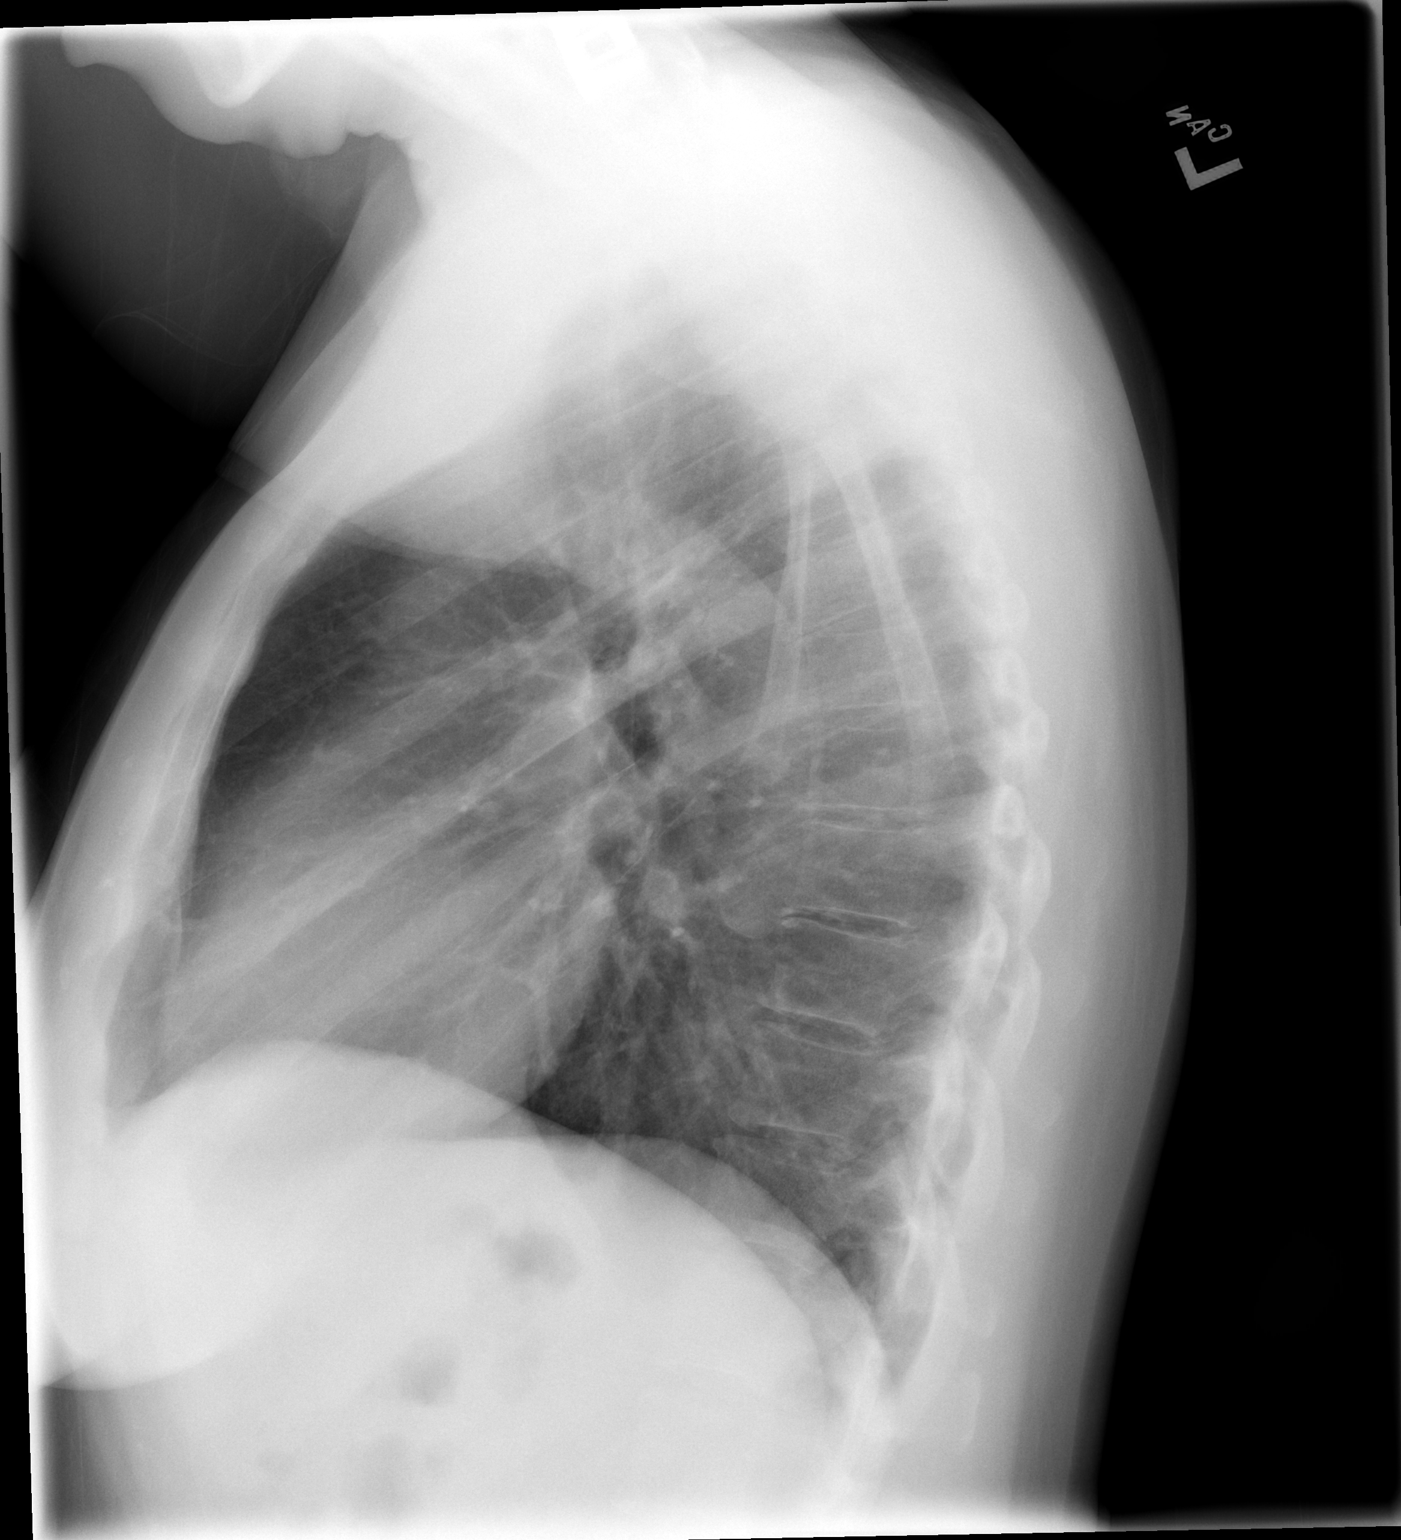

[2 of 2 positions shown; findings below may reference images not displayed]

FINDINGS: Cardiomediastinal silhouette is normal. The lungs are clear without
pleural effusions or focal consolidations. Trachea projects midline
and there is no pneumothorax. Soft tissue planes and included
osseous structures are non-suspicious. ACDF.
IMPRESSION: Normal chest.

## 2017-07-08 DIAGNOSIS — H9311 Tinnitus, right ear: Secondary | ICD-10-CM | POA: Insufficient documentation

## 2017-07-23 ENCOUNTER — Emergency Department (HOSPITAL_BASED_OUTPATIENT_CLINIC_OR_DEPARTMENT_OTHER)
Admission: EM | Admit: 2017-07-23 | Discharge: 2017-07-23 | Disposition: A | Discharge status: Home / Self Care | Payer: Medicare HMO | Attending: Emergency Medicine | Admitting: Emergency Medicine

## 2017-07-23 ENCOUNTER — Encounter (HOSPITAL_BASED_OUTPATIENT_CLINIC_OR_DEPARTMENT_OTHER): Payer: Self-pay

## 2017-07-23 DIAGNOSIS — S61214A Laceration without foreign body of right ring finger without damage to nail, initial encounter: Secondary | ICD-10-CM | POA: Diagnosis not present

## 2017-07-23 DIAGNOSIS — Y999 Unspecified external cause status: Secondary | ICD-10-CM | POA: Insufficient documentation

## 2017-07-23 DIAGNOSIS — X58XXXA Exposure to other specified factors, initial encounter: Secondary | ICD-10-CM | POA: Insufficient documentation

## 2017-07-23 DIAGNOSIS — Y939 Activity, unspecified: Secondary | ICD-10-CM | POA: Insufficient documentation

## 2017-07-23 DIAGNOSIS — Y929 Unspecified place or not applicable: Secondary | ICD-10-CM | POA: Diagnosis not present

## 2017-07-23 DIAGNOSIS — Z23 Encounter for immunization: Secondary | ICD-10-CM | POA: Insufficient documentation

## 2017-07-23 DIAGNOSIS — Z79899 Other long term (current) drug therapy: Secondary | ICD-10-CM | POA: Insufficient documentation

## 2017-07-23 DIAGNOSIS — S6991XA Unspecified injury of right wrist, hand and finger(s), initial encounter: Secondary | ICD-10-CM | POA: Diagnosis present

## 2017-07-23 MED ORDER — TETANUS-DIPHTH-ACELL PERTUSSIS 5-2.5-18.5 LF-MCG/0.5 IM SUSP
0.5000 mL | Freq: Once | INTRAMUSCULAR | Status: AC
  Administered 2017-07-23: 0.5 mL via INTRAMUSCULAR
  Filled 2017-07-23: qty 0.5

## 2017-07-23 MED ORDER — IBUPROFEN 600 MG PO TABS: 600.0000 mg | ORAL_TABLET | Freq: Four times a day (QID) | ORAL | 0 refills | Status: DC | PRN

## 2017-07-23 MED ORDER — LIDOCAINE HCL 2 % IJ SOLN
10.0000 mL | Freq: Once | INTRAMUSCULAR | Status: AC
  Administered 2017-07-23: 200 mg via INTRADERMAL
  Filled 2017-07-23: qty 20

## 2017-07-23 MED FILL — IBUPROFEN 600 MG TABLET: 600 | 7 days supply | Qty: 30 | Fill #0

## 2017-07-23 NOTE — Discharge Instructions (Signed)
Please have your sutures remove in 5-7 days.  Take ibuprofen as needed for pain.

## 2017-07-23 NOTE — ED Notes (Signed)
ED Provider at bedside. 

## 2017-07-23 NOTE — ED Provider Notes (Signed)
[facility] Provider Note   CSN: [id number] Arrival date & time: 07/23/17  1220     History   Chief Complaint Chief Complaint  Patient presents with  . Finger Injury    HPI Robin Schmidt is a 51 y.o. female.  HPI   51 year old female presenting for evaluation of finger injury. Pt was picking up a trash bag to throw into the trash bin earlier in the day when a sharp object cuts her R ring finger.  Report of acute onset of sharp pain to affected area with bleeding.  Report initial numbness which has resolved.  Pain is mild to moderate and non radiating.  She is R hand dominant.  Denies any other injury.  No specific treatment tried.    Past Medical History:  Diagnosis Date  . Anxiety   . Back pain, chronic   . Depression     Patient Active Problem List   Diagnosis Date Noted  . Radiculopathy 11/22/2013  . Menometrorrhagia 06/08/2012    Class: Chronic    Past Surgical History:  Procedure Laterality Date  . ANTERIOR CERVICAL DECOMP/DISCECTOMY FUSION N/A 11/22/2013   Procedure: ANTERIOR CERVICAL DECOMPRESSION/DISCECTOMY FUSION 1 LEVEL;  Surgeon: Sinclair Ship, MD;  Location: [facility];  Service: Orthopedics;  Laterality: N/A;  Anterior cervical decompression fusion, cervical 5-6 with instrumentation and allograft  . back fusion    . BACK SURGERY    . KNEE ARTHROSCOPY    . LAPAROSCOPIC GASTRIC SLEEVE RESECTION    . TUBAL LIGATION      OB History    No data available       Home Medications    Prior to Admission medications   Medication Sig Start Date End Date Taking? Authorizing Provider  cyclobenzaprine (FLEXERIL) 10 MG tablet Take 10 mg by mouth 3 (three) times daily as needed for muscle spasms.    [provider]  DULoxetine (CYMBALTA) 30 MG capsule Take 30 mg by mouth daily.    [provider]  gabapentin (NEURONTIN) 300 MG capsule Take 300 mg by mouth 2 (two) times daily.      [provider]    Family  History No family history on file.  Social History Social History  Substance Use Topics  . Smoking status: Never Smoker  . Smokeless tobacco: Never Used  . Alcohol use No     Allergies   Patient has no known allergies.   Review of Systems Review of Systems  Skin: Positive for wound.  Neurological: Negative for numbness.     Physical Exam Updated Vital Signs BP 128/81 (BP Location: Left Arm)   Pulse (!) 57   Temp 98.3 F (36.8 C) (Oral)   Resp 16   Ht 5\' 6"  (1.676 m)   Wt 73 kg (161 lb)   SpO2 100%   BMI 25.99 kg/m   Physical Exam  Constitutional: She appears well-developed and well-nourished. No distress.  HENT:  Head: Atraumatic.  Eyes: Conjunctivae are normal.  Neck: Neck supple.  Neurological: She is alert.  Skin: No rash noted.  R hand: a2 cm superficial horizontal laceration noted to pad of ring finger without nail involvement, not actively bleeding.    Psychiatric: She has a normal mood and affect.  Nursing note and vitals reviewed.    ED Treatments / Results  Labs (all labs ordered are listed, but only abnormal results are displayed) Labs Reviewed - No data to display  EKG  EKG Interpretation None  Radiology No results found.  Procedures .Marland KitchenLaceration Repair Date/Time: 07/23/2017 2:44 PM Performed by: Domenic Moras Authorized by: Domenic Moras   Consent:    Consent obtained:  Verbal   Consent given by:  Patient   Risks discussed:  Poor wound healing and nerve damage   Alternatives discussed:  Delayed treatment, referral and no treatment Anesthesia (see MAR for exact dosages):    Anesthesia method:  Nerve block   Block location:  Right ring finger digital nerve block   Block needle gauge:  25 G   Block anesthetic:  Lidocaine 2% w/o epi   Block technique:  Digital block   Block injection procedure:  Anatomic landmarks identified   Block outcome:  Anesthesia achieved Laceration details:    Location:  Finger   Finger location:  R  ring finger   Length (cm):  2   Depth (mm):  3 Repair type:    Repair type:  Simple Pre-procedure details:    Preparation:  Patient was prepped and draped in usual sterile fashion Exploration:    Hemostasis achieved with:  Direct pressure   Wound exploration: wound explored through full range of motion and entire depth of wound probed and visualized     Contaminated: no   Treatment:    Area cleansed with:  Betadine   Amount of cleaning:  Standard   Irrigation solution:  Sterile saline   Irrigation volume:  100   Irrigation method:  Pressure wash   Visualized foreign bodies/material removed: no   Skin repair:    Repair method:  Sutures   Suture size:  5-0   Suture material:  Prolene   Suture technique:  Simple interrupted Approximation:    Approximation:  Close   Vermilion border: well-aligned   Post-procedure details:    Dressing:  Adhesive bandage   Patient tolerance of procedure:  Tolerated well, no immediate complications   (including critical care time)  Medications Ordered in ED Medications - No data to display   Initial Impression / Assessment and Plan / ED Course  I have reviewed the triage vital signs and the nursing notes.  Pertinent labs & imaging results that were available during my care of the patient were reviewed by me and considered in my medical decision making (see chart for details).     BP 128/81 (BP Location: Left Arm)   Pulse (!) 57   Temp 98.3 F (36.8 C) (Oral)   Resp 16   Ht 5\' 6"  (1.676 m)   Wt 73 kg (161 lb)   SpO2 100%   BMI 25.99 kg/m    Final Clinical Impressions(s) / ED Diagnoses   Final diagnoses:  Laceration of right ring finger without foreign body without damage to nail, initial encounter    New Prescriptions New Prescriptions   IBUPROFEN (ADVIL,MOTRIN) 600 MG TABLET    Take 1 tablet (600 mg total) by mouth every 6 (six) hours as needed.   1:42 PM Pt with superficial lac to R ring finger, no fb noted, pt is NVI.     2:46 PM  Successful lac repair.  Recommend sutures removal in 5-7 days.    Domenic Moras, PA-C 07/23/17 1446    Lajean Saver, MD 07/23/17 [phone]

## 2017-07-23 NOTE — ED Triage Notes (Signed)
Pt states she cut right ring finger on something in her trash-?metal can lid approx 1 hour PTA-lac noted

## 2018-04-16 ENCOUNTER — Emergency Department (HOSPITAL_BASED_OUTPATIENT_CLINIC_OR_DEPARTMENT_OTHER)
Admission: EM | Admit: 2018-04-16 | Discharge: 2018-04-17 | Disposition: A | Discharge status: Home / Self Care | Payer: Medicare HMO | Attending: Emergency Medicine | Admitting: Emergency Medicine

## 2018-04-16 ENCOUNTER — Encounter (HOSPITAL_BASED_OUTPATIENT_CLINIC_OR_DEPARTMENT_OTHER): Payer: Self-pay | Admitting: Emergency Medicine

## 2018-04-16 ENCOUNTER — Other Ambulatory Visit: Payer: Self-pay

## 2018-04-16 DIAGNOSIS — R51 Headache: Secondary | ICD-10-CM | POA: Diagnosis present

## 2018-04-16 DIAGNOSIS — Z79899 Other long term (current) drug therapy: Secondary | ICD-10-CM | POA: Diagnosis not present

## 2018-04-16 DIAGNOSIS — G43819 Other migraine, intractable, without status migrainosus: Secondary | ICD-10-CM | POA: Diagnosis not present

## 2018-04-16 MED ORDER — METOCLOPRAMIDE HCL 5 MG/ML IJ SOLN
10.0000 mg | Freq: Once | INTRAMUSCULAR | Status: AC
  Administered 2018-04-16: 10 mg via INTRAVENOUS
  Filled 2018-04-16: qty 2

## 2018-04-16 MED ORDER — KETOROLAC TROMETHAMINE 30 MG/ML IJ SOLN
30.0000 mg | Freq: Once | INTRAMUSCULAR | Status: AC
  Administered 2018-04-16: 30 mg via INTRAVENOUS
  Filled 2018-04-16: qty 1

## 2018-04-16 MED ORDER — DIPHENHYDRAMINE HCL 25 MG PO CAPS
25.0000 mg | ORAL_CAPSULE | Freq: Once | ORAL | Status: AC
  Administered 2018-04-16: 25 mg via ORAL
  Filled 2018-04-16: qty 1

## 2018-04-16 NOTE — Discharge Instructions (Addendum)
You were seen today for a headache. Your headache was most likely from a migraine. Please come back if your headache worsens, you develop vision changes, weakness, or numbness. You can take 800 mg of Ibuprofen at home every 8 hours as needed but do not take for more than 1 week. Take care!

## 2018-04-16 NOTE — ED Provider Notes (Signed)
[facility] Provider Note   CSN: [id number] Arrival date & time: 04/16/18  2157     History   Chief Complaint Chief Complaint  Patient presents with  . Headache    HPI Robin Schmidt is a 52 y.o. female here with    HEADACHE  Headache started 2 hours ago Pain is sharp and throbbing. Feels like "I got hit upside the head" Keeps from doing: nothing Location: right parietal region Medications tried: nothing Patient thinks cause of headache might be: unsure  Head trauma: no Sudden onset: yes Previous similar headaches: no Taking blood thinners: no History of cancer: no  Symptoms Nose congestion stuffiness: getting over a cold Nausea vomiting: no Photophobia: no Noise sensitivity: no Double vision or loss of vision: no Fever: no Neck Stiffness: no Trouble walking or speaking: no  She notes that she did not take her medications today (flexeril, cymbalta, gabapentin)  HPI  Past Medical History:  Diagnosis Date  . Anxiety   . Back pain, chronic   . Depression     Patient Active Problem List   Diagnosis Date Noted  . Radiculopathy 11/22/2013  . Menometrorrhagia 06/08/2012    Class: Chronic    Past Surgical History:  Procedure Laterality Date  . ANTERIOR CERVICAL DECOMP/DISCECTOMY FUSION N/A 11/22/2013   Procedure: ANTERIOR CERVICAL DECOMPRESSION/DISCECTOMY FUSION 1 LEVEL;  Surgeon: Sinclair Ship, MD;  Location: [facility];  Service: Orthopedics;  Laterality: N/A;  Anterior cervical decompression fusion, cervical 5-6 with instrumentation and allograft  . back fusion    . BACK SURGERY    . KNEE ARTHROSCOPY    . LAPAROSCOPIC GASTRIC SLEEVE RESECTION    . TUBAL LIGATION       OB History   None      Home Medications    Prior to Admission medications   Medication Sig Start Date End Date Taking? Authorizing Provider  cyclobenzaprine (FLEXERIL) 10 MG tablet Take 10 mg by mouth 3 (three) times daily as needed for  muscle spasms.    [provider]  DULoxetine (CYMBALTA) 30 MG capsule Take 30 mg by mouth daily.    [provider]  gabapentin (NEURONTIN) 300 MG capsule Take 300 mg by mouth 2 (two) times daily.      [provider]  ibuprofen (ADVIL,MOTRIN) 600 MG tablet Take 1 tablet (600 mg total) by mouth every 6 (six) hours as needed. 07/23/17   Domenic Moras, PA-C    Family History History reviewed. No pertinent family history.  Social History Social History   Tobacco Use  . Smoking status: Never Smoker  . Smokeless tobacco: Never Used  Substance Use Topics  . Alcohol use: No  . Drug use: No     Allergies   Patient has no known allergies.   Review of Systems Review of Systems  HENT: Negative for congestion.   Respiratory: Negative for chest tightness and shortness of breath.   Neurological: Positive for headaches. Negative for dizziness, tremors, seizures, syncope, facial asymmetry, speech difficulty, weakness, light-headedness and numbness.  All other systems reviewed and are negative.    Physical Exam Updated Vital Signs BP 122/77 (BP Location: Left Arm)   Pulse 64   Temp 98.1 F (36.7 C) (Oral)   Resp 20   Ht 5\' 6"  (1.676 m)   Wt 71.7 kg (158 lb)   SpO2 100%   BMI 25.50 kg/m   Physical Exam  Constitutional: She is oriented to person, place, and time. She appears  well-developed and well-nourished.  HENT:  Head: Normocephalic and atraumatic.  Mouth/Throat: Oropharynx is clear and moist.  Eyes: Pupils are equal, round, and reactive to light. EOM are normal. Right eye exhibits normal extraocular motion and no nystagmus. Left eye exhibits normal extraocular motion and no nystagmus. Right pupil is reactive. Left pupil is reactive.  Neck: Normal range of motion. Neck supple.  Cardiovascular: Normal rate, regular rhythm, normal heart sounds and intact distal pulses.  Pulmonary/Chest: Effort normal and breath sounds normal. No respiratory distress. She  has no wheezes.  Abdominal: Soft. Bowel sounds are normal.  Musculoskeletal: Normal range of motion. She exhibits no edema or tenderness.  Lymphadenopathy:    She has no cervical adenopathy.  Neurological: She is alert and oriented to person, place, and time. She has normal strength. She displays no tremor. No cranial nerve deficit or sensory deficit. She exhibits normal muscle tone. Coordination and gait normal.  Skin: Skin is warm and dry. Capillary refill takes less than 2 seconds. No rash noted.  Psychiatric: She has a normal mood and affect.     ED Treatments / Results  Labs (all labs ordered are listed, but only abnormal results are displayed) Labs Reviewed - No data to display  EKG None  Radiology No results found.  Procedures Procedures (including critical care time)  Medications Ordered in ED Medications  ketorolac (TORADOL) 30 MG/ML injection 30 mg (30 mg Intravenous Given 04/16/18 2322)  diphenhydrAMINE (BENADRYL) capsule 25 mg (25 mg Oral Given 04/16/18 2309)  metoCLOPramide (REGLAN) injection 10 mg (10 mg Intravenous Given 04/16/18 2310)     Initial Impression / Assessment and Plan / ED Course  I have reviewed the triage vital signs and the nursing notes.  Pertinent labs & imaging results that were available during my care of the patient were reviewed by me and considered in my medical decision making (see chart for details).    52 yo F here headache, onset 2 hours ago.  Neurological exam without any deficits.  Symptoms seem most consistent with migraine.  Migraine cocktail given; Toradol, Benadryl, Reglan. Headache improved. Patient will take Ibuprofen at home as needed for pain. Return precautions discussed. She will follow up with neurology if headaches become recurrent. Stable for discharge.   Final Clinical Impressions(s) / ED Diagnoses   Final diagnoses:  Other migraine without status migrainosus, intractable    ED Discharge Orders    None         Carlyle Dolly, MD 04/16/18 2354    Margette Fast, MD 04/17/18 [phone]

## 2018-04-16 NOTE — ED Notes (Signed)
ED Provider at bedside. 

## 2018-04-16 NOTE — ED Triage Notes (Signed)
Patient states that she has had a headache to the left side of her face and up into her head. It is a sharp pain that scared her.

## 2018-04-17 NOTE — ED Notes (Signed)
Pt given d/c instructions as per chart. Verbalizes understanding. No questions. 

## 2020-05-17 ENCOUNTER — Encounter (HOSPITAL_BASED_OUTPATIENT_CLINIC_OR_DEPARTMENT_OTHER): Payer: Self-pay | Admitting: Emergency Medicine

## 2020-05-17 ENCOUNTER — Emergency Department (HOSPITAL_BASED_OUTPATIENT_CLINIC_OR_DEPARTMENT_OTHER)
Admission: EM | Admit: 2020-05-17 | Discharge: 2020-05-17 | Disposition: A | Discharge status: Home / Self Care | Payer: BC Managed Care – PPO | Attending: Emergency Medicine | Admitting: Emergency Medicine

## 2020-05-17 ENCOUNTER — Other Ambulatory Visit: Payer: Self-pay

## 2020-05-17 DIAGNOSIS — X58XXXA Exposure to other specified factors, initial encounter: Secondary | ICD-10-CM | POA: Insufficient documentation

## 2020-05-17 DIAGNOSIS — Y929 Unspecified place or not applicable: Secondary | ICD-10-CM | POA: Insufficient documentation

## 2020-05-17 DIAGNOSIS — Y939 Activity, unspecified: Secondary | ICD-10-CM | POA: Insufficient documentation

## 2020-05-17 DIAGNOSIS — Y999 Unspecified external cause status: Secondary | ICD-10-CM | POA: Diagnosis not present

## 2020-05-17 DIAGNOSIS — H5712 Ocular pain, left eye: Secondary | ICD-10-CM | POA: Diagnosis present

## 2020-05-17 DIAGNOSIS — T1592XA Foreign body on external eye, part unspecified, left eye, initial encounter: Secondary | ICD-10-CM

## 2020-05-17 DIAGNOSIS — Y990 Civilian activity done for income or pay: Secondary | ICD-10-CM | POA: Insufficient documentation

## 2020-05-17 MED ORDER — TETRACAINE HCL 0.5 % OP SOLN
2.0000 [drp] | Freq: Once | OPHTHALMIC | Status: AC
  Administered 2020-05-17: 2 [drp] via OPHTHALMIC
  Filled 2020-05-17: qty 4

## 2020-05-17 MED ORDER — FLUORESCEIN SODIUM 1 MG OP STRP
1.0000 | ORAL_STRIP | Freq: Once | OPHTHALMIC | Status: AC
  Administered 2020-05-17: 1 via OPHTHALMIC
  Filled 2020-05-17: qty 1

## 2020-05-17 MED ORDER — ERYTHROMYCIN 5 MG/GM OP OINT: 1.0000 "application " | TOPICAL_OINTMENT | Freq: Four times a day (QID) | OPHTHALMIC | 0 refills | Status: AC

## 2020-05-17 NOTE — Discharge Instructions (Signed)
We removed a small foreign body from your left eye. We recommend that you use the eye ointment in your left eye. We also recommend that you follow-up with your eye doctor on Monday or sooner if your eye becomes acutely more painful or your vision worsens.
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
## 2020-05-19 ENCOUNTER — Other Ambulatory Visit: Payer: Self-pay

## 2020-05-19 ENCOUNTER — Emergency Department (HOSPITAL_BASED_OUTPATIENT_CLINIC_OR_DEPARTMENT_OTHER)
Admission: EM | Admit: 2020-05-19 | Discharge: 2020-05-20 | Disposition: A | Discharge status: Home / Self Care | Payer: BC Managed Care – PPO | Attending: Emergency Medicine | Admitting: Emergency Medicine

## 2020-05-19 ENCOUNTER — Encounter (HOSPITAL_BASED_OUTPATIENT_CLINIC_OR_DEPARTMENT_OTHER): Payer: Self-pay | Admitting: Emergency Medicine

## 2020-05-19 DIAGNOSIS — Z79899 Other long term (current) drug therapy: Secondary | ICD-10-CM | POA: Diagnosis not present

## 2020-05-19 DIAGNOSIS — W25XXXA Contact with sharp glass, initial encounter: Secondary | ICD-10-CM | POA: Insufficient documentation

## 2020-05-19 DIAGNOSIS — S91011A Laceration without foreign body, right ankle, initial encounter: Secondary | ICD-10-CM | POA: Insufficient documentation

## 2020-05-19 DIAGNOSIS — Y9389 Activity, other specified: Secondary | ICD-10-CM | POA: Insufficient documentation

## 2020-05-19 DIAGNOSIS — Y9289 Other specified places as the place of occurrence of the external cause: Secondary | ICD-10-CM | POA: Diagnosis not present

## 2020-05-19 DIAGNOSIS — Y998 Other external cause status: Secondary | ICD-10-CM | POA: Insufficient documentation

## 2020-05-19 NOTE — ED Triage Notes (Signed)
Laceration to right ankle, states she cut it on a broken bowel x1-2 hours ago.

## 2020-05-20 DIAGNOSIS — S91011A Laceration without foreign body, right ankle, initial encounter: Secondary | ICD-10-CM | POA: Diagnosis not present

## 2020-05-20 MED ORDER — LIDOCAINE-EPINEPHRINE 2 %-1:100000 IJ SOLN: 20.0000 mL | Freq: Once | INTRAMUSCULAR | Status: DC

## 2020-05-20 MED ORDER — LIDOCAINE-EPINEPHRINE (PF) 1 %-1:200000 IJ SOLN
INTRAMUSCULAR | Status: AC
  Administered 2020-05-20: 30 mL
  Filled 2020-05-20: qty 30

## 2020-05-20 NOTE — ED Provider Notes (Signed)
[facility] EMERGENCY DEPARTMENT Provider Note   CSN: [id number] Arrival date & time: 05/19/20  2232     History Chief Complaint  Patient presents with  . Laceration    Robin Schmidt is a 54 y.o. female.  HPI     This is a 54 year old female who presents with laceration of the right ankle.  Patient reports that she cut her ankle on a broken bowl.  Last tetanus shot was approximately 1 year ago.  She cleaned it at home but noted some persistent bleeding.  She denies any significant pain.  Denies other injury.  Has been ambulatory.  Past Medical History:  Diagnosis Date  . Anxiety   . Back pain, chronic   . Depression     Patient Active Problem List   Diagnosis Date Noted  . Radiculopathy 11/22/2013  . Menometrorrhagia 06/08/2012    Class: Chronic    Past Surgical History:  Procedure Laterality Date  . ANTERIOR CERVICAL DECOMP/DISCECTOMY FUSION N/A 11/22/2013   Procedure: ANTERIOR CERVICAL DECOMPRESSION/DISCECTOMY FUSION 1 LEVEL;  Surgeon: Sinclair Ship, MD;  Location: [facility];  Service: Orthopedics;  Laterality: N/A;  Anterior cervical decompression fusion, cervical 5-6 with instrumentation and allograft  . back fusion    . BACK SURGERY    . KNEE ARTHROSCOPY    . LAPAROSCOPIC GASTRIC SLEEVE RESECTION    . TUBAL LIGATION       OB History   No obstetric history on file.     No family history on file.  Social History   Tobacco Use  . Smoking status: Never Smoker  . Smokeless tobacco: Never Used  Substance Use Topics  . Alcohol use: No  . Drug use: No    Home Medications Prior to Admission medications   Medication Sig Start Date End Date Taking? Authorizing Provider  ascorbic acid (VITAMIN C) 1000 MG tablet Take by mouth.    [provider]  calcium citrate-vitamin D (CITRACAL+D) 315-200 MG-UNIT tablet Take by mouth.    [provider]  celecoxib (CELEBREX) 100 MG capsule See admin instructions.    [provider]  cyclobenzaprine (FLEXERIL) 10 MG tablet Take 10 mg by mouth 3 (three) times daily as needed for muscle spasms.    [provider]  DULoxetine (CYMBALTA) 30 MG capsule Take 30 mg by mouth daily.    [provider]  erythromycin ophthalmic ointment Place 1 application into the left eye 4 (four) times daily for 5 days. 05/17/20 05/22/20  Alexandria Lodge, MD  gabapentin (NEURONTIN) 300 MG capsule Take 300 mg by mouth 2 (two) times daily.      [provider]  GABAPENTIN PO 100 mg Two (2) times a day.    [provider]  ibuprofen (ADVIL,MOTRIN) 600 MG tablet Take 1 tablet (600 mg total) by mouth every 6 (six) hours as needed. 07/23/17   Domenic Moras, PA-C  Multiple Vitamins-Minerals (MULTIVITAMIN WITH MINERALS) tablet Take by mouth.    [provider]  Omega-3 1000 MG CAPS Take by mouth.    [provider]    Allergies    Patient has no known allergies.  Review of Systems   Review of Systems  Skin: Positive for wound. Negative for color change.  Neurological: Negative for weakness and numbness.  All other systems reviewed and are negative.   Physical Exam Updated Vital Signs BP 102/80 (BP Location: Left Arm)   Pulse 80   Temp 98.8 F (37.1 C) (Oral)  Resp 18   Wt 77.3 kg   LMP 03/12/2016   SpO2 98%   BMI 27.52 kg/m   Physical Exam Vitals and nursing note reviewed.  Constitutional:      Appearance: She is well-developed.  HENT:     Head: Normocephalic and atraumatic.     Mouth/Throat:     Mouth: Mucous membranes are moist.  Cardiovascular:     Rate and Rhythm: Normal rate and regular rhythm.  Pulmonary:     Effort: Pulmonary effort is normal. No respiratory distress.  Musculoskeletal:     Cervical back: Neck supple.     Comments: Normal range of motion right ankle, no swelling or deformity noted  Skin:    General: Skin is warm and dry.     Comments: 3 cm linear laceration over the lateral malleolus, slight  gaping, bleeding controlled, no foreign bodies noted  Neurological:     Mental Status: She is alert and oriented to person, place, and time.  Psychiatric:        Mood and Affect: Mood normal.     ED Results / Procedures / Treatments   Labs (all labs ordered are listed, but only abnormal results are displayed) Labs Reviewed - No data to display  EKG None  Radiology No results found.  Procedures .Marland KitchenLaceration Repair  Date/Time: 05/20/2020 12:54 AM Performed by: Merryl Hacker, MD Authorized by: Merryl Hacker, MD   Consent:    Consent obtained:  Verbal   Consent given by:  Patient   Risks discussed:  Infection, pain and poor cosmetic result   Alternatives discussed:  No treatment Anesthesia (see MAR for exact dosages):    Anesthesia method:  Local infiltration   Local anesthetic:  Lidocaine 2% WITH epi Laceration details:    Location:  Foot   Foot location:  R ankle   Length (cm):  3   Depth (mm):  3 Repair type:    Repair type:  Simple Pre-procedure details:    Preparation:  Patient was prepped and draped in usual sterile fashion Exploration:    Wound extent: no nerve damage noted, no tendon damage noted, no underlying fracture noted and no vascular damage noted     Contaminated: no   Treatment:    Area cleansed with:  Betadine   Amount of cleaning:  Standard   Irrigation solution:  Sterile saline   Irrigation method:  Syringe   Visualized foreign bodies/material removed: no   Skin repair:    Repair method:  Sutures   Suture size:  4-0   Suture material:  Nylon   Suture technique:  Simple interrupted   Number of sutures:  3 Approximation:    Approximation:  Close Post-procedure details:    Dressing:  Open (no dressing)   (including critical care time)  Medications Ordered in ED Medications  lidocaine-EPINEPHrine (XYLOCAINE W/EPI) 2 %-1:100000 (with pres) injection 20 mL (20 mLs Infiltration Not Given 05/20/20 0014)  lidocaine-EPINEPHrine  (XYLOCAINE-EPINEPHrine) 1 %-1:200000 (PF) injection (30 mLs  Given 05/20/20 0014)    ED Course  I have reviewed the triage vital signs and the nursing notes.  Pertinent labs & imaging results that were available during my care of the patient were reviewed by me and considered in my medical decision making (see chart for details).    MDM Rules/Calculators/A&P                           Patient presents with a  simple laceration of the right ankle.  Overall nontoxic vital signs are reassuring.  Neurovascularly intact.  Laceration was cleaned and repaired at the bedside as above.  Tetanus is up-to-date.  Recommend suture removal in 10 days.  After history, exam, and medical workup I feel the patient has been appropriately medically screened and is safe for discharge home. Pertinent diagnoses were discussed with the patient. Patient was given return precautions.   Final Clinical Impression(s) / ED Diagnoses Final diagnoses:  Laceration of right ankle, initial encounter    Rx / DC Orders ED Discharge Orders    None       Keil Pickering, Barbette Hair, MD 05/20/20 [phone]

## 2020-05-20 NOTE — Discharge Instructions (Addendum)
You were seen today for a laceration.  Follow-up in 10 days for suture removal.  Monitor for signs and symptoms of infection.

## 2020-11-13 ENCOUNTER — Ambulatory Visit: Payer: BC Managed Care – PPO | Admitting: Physical Therapy

## 2020-11-27 ENCOUNTER — Ambulatory Visit: Payer: Medicare HMO | Admitting: Physical Therapy

## 2020-12-04 ENCOUNTER — Encounter: Payer: Self-pay | Admitting: Physical Therapy

## 2020-12-04 ENCOUNTER — Ambulatory Visit: Payer: BC Managed Care – PPO | Attending: Orthopaedic Surgery | Admitting: Physical Therapy

## 2020-12-04 ENCOUNTER — Other Ambulatory Visit: Payer: Self-pay

## 2020-12-04 DIAGNOSIS — M6281 Muscle weakness (generalized): Secondary | ICD-10-CM | POA: Insufficient documentation

## 2020-12-04 DIAGNOSIS — M25631 Stiffness of right wrist, not elsewhere classified: Secondary | ICD-10-CM | POA: Insufficient documentation

## 2020-12-04 DIAGNOSIS — M25531 Pain in right wrist: Secondary | ICD-10-CM | POA: Diagnosis present

## 2020-12-04 NOTE — Therapy (Signed)
[facility] [address]  [place], [place], [address] Phone: [phone]   Fax:  [phone]  Physical Therapy Evaluation  Patient Details  Name: Robin Schmidt MRN: [id number] Date of Birth: 04-24-1966 Referring Provider (PT): Jasper Loser, MD   Encounter Date: 12/04/2020   PT End of Session - 12/04/20 1549    Visit Number 1    Number of Visits 13    Date for PT Re-Evaluation 01/15/21    Authorization Type [employer] Medicare, BCBS, Traditional Medicaid    PT Start Time 1325   pt late   PT Stop Time 1358    PT Time Calculation (min) 33 min    Activity Tolerance Patient tolerated treatment well;Patient limited by pain    Behavior During Therapy [facility] for tasks assessed/performed           Past Medical History:  Diagnosis Date  . Anxiety   . Back pain, chronic   . Depression     Past Surgical History:  Procedure Laterality Date  . ANTERIOR CERVICAL DECOMP/DISCECTOMY FUSION N/A 11/22/2013   Procedure: ANTERIOR CERVICAL DECOMPRESSION/DISCECTOMY FUSION 1 LEVEL;  Surgeon: Sinclair Ship, MD;  Location: [facility];  Service: Orthopedics;  Laterality: N/A;  Anterior cervical decompression fusion, cervical 5-6 with instrumentation and allograft  . back fusion    . BACK SURGERY    . KNEE ARTHROSCOPY    . LAPAROSCOPIC GASTRIC SLEEVE RESECTION    . TUBAL LIGATION      There were no vitals filed for this visit.    Subjective Assessment - 12/04/20 1327    Subjective Patient reports sustaining a R wrist fracture in August. Notes that she still experiences pain and N/T in the anterior wrist and hand. Aggravated by "rest" and better with movement, massage, and injection. 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 Wearing splint throughout the day as instructed by MD.    Pertinent History depression, chronic back pain, anxiety, ACDF C5-6 2015, back fusion, knee arthroscopy    Limitations Lifting;Writing;House  hold activities    Diagnostic tests 11/02/20 R wrist xray: No acute fractures. Patchy marrow edema in the distal radius. Minimal joint effusions. granulation tissue or synovitis adjacent to the ulnar styloid at the TFCC periphery. mild increased T2 signal involving the central zone of ECU tendon. Mild dorsal carpal soft tissue swelling which may represent dorsal capsule sprain    Patient Stated Goals decrease pain    Currently in Pain? Yes    Pain Score 6    6.5   Pain Location Wrist    Pain Orientation Left    Pain Descriptors / Indicators Aching;Numbness    Pain Type Chronic pain              [facility] PT Assessment - 12/04/20 1332      Assessment   Medical Diagnosis R wrist pain    Referring Provider (PT) Jasper Loser, MD    Onset Date/Surgical Date 06/29/20    Hand Dominance Right    Next MD Visit 12/10/19    Prior Therapy yes      Precautions   Precautions None      Balance Screen   Has the patient fallen in the past 6 months Yes    How many times? 2   tripped over grandson's shoe & fell off chair   Has the patient had a decrease in activity level because of a fear of falling?  No  Is the patient reluctant to leave their home because of a fear of falling?  No      Home Ecologist residence    Transport planner;Children;Other relatives   caregiver for mother   Available Help at Discharge Family    Type of Home House      Prior Function   Level of Independence Independent    Vocation Part time employment    Vocation Requirements computer work    Leisure Management consultant   Overall Cognitive Status Within Functional Limits for tasks assessed      Sensation   Light Touch Appears Intact      Coordination   Gross Motor Movements are Fluid and Coordinated Yes      Posture/Postural Control   Posture/Postural Control Postural limitations    Postural Limitations Rounded Shoulders;Weight shift right    Posture Comments  L shoulder elevated      ROM / Strength   AROM / PROM / Strength AROM;Strength      AROM   AROM Assessment Site Wrist    Right/Left Wrist Left    Right Wrist Extension 68 Degrees    Right Wrist Flexion 50 Degrees   pain   Right Wrist Radial Deviation 25 Degrees   pain   Right Wrist Ulnar Deviation 19 Degrees   pain   Left Wrist Extension 88 Degrees    Left Wrist Flexion 81 Degrees    Left Wrist Radial Deviation 30 Degrees    Left Wrist Ulnar Deviation 42 Degrees      Strength   Strength Assessment Site Wrist;Hand    Right/Left Wrist Right;Left    Right Wrist Flexion 4-/5   pain   Right Wrist Extension 4-/5    Right Wrist Radial Deviation 4/5    Right Wrist Ulnar Deviation 4/5    Left Wrist Flexion 4/5    Left Wrist Extension 4+/5    Left Wrist Radial Deviation 4+/5    Left Wrist Ulnar Deviation 4+/5    Right/Left hand Right;Left    Right Hand Grip (lbs) 11.33   11, 10, 13   Left Hand Grip (lbs) 38   39, 36, 39     Palpation   Palpation comment TTP over dorsal wrist and palmar distal ulna; carpals hypomobile with AP and PA mobs                      Objective measurements completed on examination: See above findings.               PT Education - 12/04/20 1549    Education Details prognosis, POC, HEP    Person(s) Educated Patient    Methods Explanation;Demonstration;Tactile cues;Verbal cues;Handout    Comprehension Returned demonstration;Verbalized understanding            PT Short Term Goals - 12/04/20 1554      PT SHORT TERM GOAL #1   Title Patient to be independent with initial HEP.    Time 2    Period Weeks    Status New    Target Date 12/18/20             PT Long Term Goals - 12/04/20 1555      PT LONG TERM GOAL #1   Title Patient to be independent with advanced HEP.    Time 6    Period Weeks    Status New    Target Date  01/15/21      PT LONG TERM GOAL #2   Title Patient to demonstrate R wrist strength >/=4+/5 and grip  strength >20lbs.    Time 6    Period Weeks    Status New    Target Date 01/15/21      PT LONG TERM GOAL #3   Title Patient to report 60% improvement in frequency/intensity of R hand N/T.    Time 6    Period Weeks    Status New    Target Date 01/15/21      PT LONG TERM GOAL #4   Title Patient to report tolerance for work duties without pain limiting.    Time 6    Period Weeks    Status New    Target Date 01/15/21                  Plan - 12/04/20 1550    Clinical Impression Statement Patient is a 55 y/o F presenting to [facility] with c/o R wrist pain after sustaining a R distal radial fx in August 2021. Pain occurs over the dorsal hand and wrist with intermittent N/T. Aggravated by "rest" and better with movement, massage, and injection. Per patient- NCV study showed mild CTS but unable to access this today. Patient today presenting with B rounded and L elevated shoulder, limited and painful R wrist AROM, decreased R wrist and grip strength, TTP over dorsal wrist and palmar distal ulna, and hypomobility in R carpals with joint mobilizations. Patient was educated on gentle stretching and strengthening HEP- patient reported understanding. Would benefit from skilled PT services 2 x/week for 6 weeks to address aforementioned impairments.    Personal Factors and Comorbidities Age;Comorbidity 3+;Fitness;Past/Current Experience;Profession;Time since onset of injury/illness/exacerbation    Comorbidities depression, chronic back pain, anxiety, ACDF C5-6 2015, back fusion, knee arthroscopy    Examination-Activity Limitations Caring for Others;Carry;Dressing;Hygiene/Grooming;Lift;Reach Overhead    Examination-Participation Restrictions Cleaning;Shop;Yard Work;Laundry;Meal Prep;Occupation    Stability/Clinical Decision Making Stable/Uncomplicated    Clinical Decision Making Low    Rehab Potential Good    PT Frequency 2x / week    PT Duration 6 weeks    PT Treatment/Interventions ADLs/Self Care  Home Management;Cryotherapy;Electrical Stimulation;Iontophoresis 4mg /ml Dexamethasone;Moist Heat;Therapeutic exercise;Therapeutic activities;Functional mobility training;Ultrasound;Neuromuscular re-education;Patient/family education;Manual techniques;Vasopneumatic Device;Taping;Energy conservation;Dry needling;Passive range of motion    PT Next Visit Plan wrist FOTO, reassess HEP, progress wrist AROM and strength    Consulted and Agree with Plan of Care Patient           Patient will benefit from skilled therapeutic intervention in order to improve the following deficits and impairments:  Hypomobility,Increased edema,Decreased activity tolerance,Decreased strength,Increased fascial restricitons,Impaired UE functional use,Pain,Improper body mechanics,Decreased range of motion,Impaired flexibility,Postural dysfunction  Visit Diagnosis: Pain in right wrist  Stiffness of right wrist, not elsewhere classified  Muscle weakness (generalized)     Problem List Patient Active Problem List   Diagnosis Date Noted  . Radiculopathy 11/22/2013  . Menometrorrhagia 06/08/2012    Class: Chronic     Janene Harvey, PT, DPT 12/04/20 3:58 PM   [facility] [address]  [place], [place], [address] Phone: [phone]   Fax:  [phone]  Name: Robin Schmidt MRN: [id number] Date of Birth: [birth year]

## 2020-12-11 ENCOUNTER — Other Ambulatory Visit: Payer: Self-pay

## 2020-12-11 ENCOUNTER — Ambulatory Visit: Payer: BC Managed Care – PPO | Attending: Orthopaedic Surgery | Admitting: Physical Therapy

## 2020-12-11 ENCOUNTER — Encounter: Payer: Self-pay | Admitting: Physical Therapy

## 2020-12-11 DIAGNOSIS — M25531 Pain in right wrist: Secondary | ICD-10-CM | POA: Diagnosis not present

## 2020-12-11 DIAGNOSIS — M6281 Muscle weakness (generalized): Secondary | ICD-10-CM | POA: Insufficient documentation

## 2020-12-11 DIAGNOSIS — M25631 Stiffness of right wrist, not elsewhere classified: Secondary | ICD-10-CM

## 2020-12-11 NOTE — Therapy (Signed)
[facility] [address]  [place], [place], [address] Phone: [phone]   Fax:  [phone]  Physical Therapy Treatment  Patient Details  Name: Robin Schmidt MRN: [id number] Date of Birth: 03-03-1966 Referring Provider (PT): Jasper Loser, MD   Encounter Date: 12/11/2020   PT End of Session - 12/11/20 1553    Visit Number 2    Number of Visits 13    Date for PT Re-Evaluation 01/15/21    Authorization Type Aetna Medicare, BCBS, Traditional Medicaid    PT Start Time [phone]    PT Stop Time 1600   ice pack   PT Time Calculation (min) 53 min    Activity Tolerance Patient tolerated treatment well;Patient limited by pain    Behavior During Therapy [facility] for tasks assessed/performed           Past Medical History:  Diagnosis Date  . Anxiety   . Back pain, chronic   . Depression     Past Surgical History:  Procedure Laterality Date  . ANTERIOR CERVICAL DECOMP/DISCECTOMY FUSION N/A 11/22/2013   Procedure: ANTERIOR CERVICAL DECOMPRESSION/DISCECTOMY FUSION 1 LEVEL;  Surgeon: Sinclair Ship, MD;  Location: [place];  Service: Orthopedics;  Laterality: N/A;  Anterior cervical decompression fusion, cervical 5-6 with instrumentation and allograft  . back fusion    . BACK SURGERY    . KNEE ARTHROSCOPY    . LAPAROSCOPIC GASTRIC SLEEVE RESECTION    . TUBAL LIGATION      There were no vitals filed for this visit.   Subjective Assessment - 12/11/20 1509    Subjective Has still been having N/T in the hand. Saw the MD on the 31st who advised not to wear the splint as often. Denies questions on HEP. Returning back to work on 02/08.    Pertinent History depression, chronic back pain, anxiety, ACDF C5-6 2015, back fusion, knee arthroscopy    Diagnostic tests 11/02/20 R wrist xray: No acute fractures. Patchy marrow edema in the distal radius. Minimal joint effusions. granulation tissue or synovitis adjacent to the ulnar styloid  at the TFCC periphery. mild increased T2 signal involving the central zone of ECU tendon. Mild dorsal carpal soft tissue swelling which may represent dorsal capsule sprain    Patient Stated Goals decrease pain    Currently in Pain? Yes    Pain Score 7     Pain Location Wrist    Pain Orientation Posterior;Left    Pain Descriptors / Indicators Tingling;Sharp    Pain Type Chronic pain              [facility] PT Assessment - 12/11/20 0001      Assessment   Medical Diagnosis R wrist pain    Referring Provider (PT) Jasper Loser, MD    Onset Date/Surgical Date 06/29/20      Observation/Other Assessments   Focus on Therapeutic Outcomes (FOTO)  Wrist: 47      Strength   Strength Assessment Site Shoulder;Elbow;Forearm    Right/Left Shoulder Right;Left    Right Shoulder Flexion 4+/5    Right Shoulder ABduction 4/5    Right Shoulder Internal Rotation 4/5    Right Shoulder External Rotation 4/5    Left Shoulder Flexion 4+/5    Left Shoulder ABduction 4+/5    Left Shoulder Internal Rotation 4/5    Left Shoulder External Rotation 4+/5    Right/Left Elbow Right;Left    Right Elbow Flexion 4/5    Right Elbow Extension  4/5    Left Elbow Flexion 4+/5    Left Elbow Extension 4+/5    Right/Left Forearm Right;Left    Right Forearm Pronation 4-/5   pain   Right Forearm Supination 4/5    Left Forearm Pronation 4+/5    Left Forearm Supination 4+/5                         [facility] Adult PT Treatment/Exercise - 12/11/20 0001      Exercises   Exercises Wrist;Shoulder      Shoulder Exercises: ROM/Strengthening   UBE (Upper Arm Bike) L1 x 3 min forward/back      Wrist Exercises   Wrist Flexion Strengthening;Right;10 reps;Seated    Bar Weights/Barbell (Wrist Flexion) 1 lb   pain over R dorsal wrist   Wrist Extension Strengthening;10 reps;Seated    Bar Weights/Barbell (Wrist Extension) 1 lb    Wrist Radial Deviation Strengthening;Right;10 reps;Standing    Wrist Radial Deviation  Limitations gripping close to head of hammer    Wrist Ulnar Deviation Strengthening;Right;10 reps;Standing    Wrist Ulnar Deviation Limitations gripping close to head of hammer    Other wrist exercises R wrist flexion/extension stretch 2x30" to tolerance   cues to maintain straight elbows     Modalities   Modalities Cryotherapy      Cryotherapy   Number Minutes Cryotherapy 10 Minutes    Cryotherapy Location Wrist   R   Type of Cryotherapy Ice pack      Manual Therapy   Manual Therapy Soft tissue mobilization;Myofascial release;Joint mobilization;Passive ROM    Manual therapy comments sitting    Joint Mobilization R wrist AP and PA joint mobs grade III to tolerance; wrist joint distraction grade III to tolerance    Soft tissue mobilization STM to R wrist extensors and flexors   palpable trigger points throughout wrist extensors   Passive ROM R wrist flexion, extension, radial deviation, ulnar deviation PROM to tolerance with prolonged holds                  PT Education - 12/11/20 1552    Education Details review of HEP- educated on exercise modifications to decrease pain/improve tolerance    Person(s) Educated Patient    Methods Explanation;Demonstration;Tactile cues;Verbal cues    Comprehension Verbalized understanding;Returned demonstration            PT Short Term Goals - 12/11/20 1557      PT SHORT TERM GOAL #1   Title Patient to be independent with initial HEP.    Time 2    Period Weeks    Status Achieved    Target Date 12/18/20             PT Long Term Goals - 12/11/20 1557      PT LONG TERM GOAL #1   Title Patient to be independent with advanced HEP.    Time 6    Period Weeks    Status On-going      PT LONG TERM GOAL #2   Title Patient to demonstrate R wrist strength >/=4+/5 and grip strength >20lbs.    Time 6    Period Weeks    Status On-going      PT LONG TERM GOAL #3   Title Patient to report 60% improvement in frequency/intensity of R  hand N/T.    Time 6    Period Weeks    Status On-going      PT LONG TERM  GOAL #4   Title Patient to report tolerance for work duties without pain limiting.    Time 6    Period Weeks    Status On-going                 Plan - 12/11/20 1553    Clinical Impression Statement Patient reports that she saw her MD on January 31st who advised to wean out of L wrist splint. Denies questions on HEP. Assessed proximal joint strength with patient demonstrating weakness R>L. Reviewed HEP with patient reporting pain over the R dorsal wrist with wrist flexion, but able to tolerate. Educated patient on exercise modifications to improve tolerance at home. Worked on MT to address soft tissue restriction, joint hypomobility, and limited ROM. Patient demonstrated most stiffness over the radial wrist and demonstrated multiple trigger points over the wrist extensors. Patient reported slightly increased pain levels at end of session, which was addressed with ice pack. No further complaints at end of session.    Comorbidities depression, chronic back pain, anxiety, ACDF C5-6 2015, back fusion, knee arthroscopy    PT Treatment/Interventions ADLs/Self Care Home Management;Cryotherapy;Electrical Stimulation;Iontophoresis 4mg /ml Dexamethasone;Moist Heat;Therapeutic exercise;Therapeutic activities;Functional mobility training;Ultrasound;Neuromuscular re-education;Patient/family education;Manual techniques;Vasopneumatic Device;Taping;Energy conservation;Dry needling;Passive range of motion    PT Next Visit Plan progress wrist AROM and strength    Consulted and Agree with Plan of Care Patient           Patient will benefit from skilled therapeutic intervention in order to improve the following deficits and impairments:  Hypomobility,Increased edema,Decreased activity tolerance,Decreased strength,Increased fascial restricitons,Impaired UE functional use,Pain,Improper body mechanics,Decreased range of motion,Impaired  flexibility,Postural dysfunction  Visit Diagnosis: Pain in right wrist  Stiffness of right wrist, not elsewhere classified  Muscle weakness (generalized)     Problem List Patient Active Problem List   Diagnosis Date Noted  . Radiculopathy 11/22/2013  . Menometrorrhagia 06/08/2012    Class: Chronic     Janene Harvey, PT, DPT 12/11/20 4:04 PM   [facility] [address]  [place], [place], [address] Phone: [phone]   Fax:  [phone]  Name: Robin Schmidt MRN: [id number] Date of Birth: 10-06-1966

## 2020-12-18 ENCOUNTER — Encounter: Payer: Medicare HMO | Admitting: Physical Therapy

## 2020-12-23 ENCOUNTER — Encounter: Payer: Medicare HMO | Admitting: Physical Therapy

## 2020-12-24 ENCOUNTER — Ambulatory Visit: Payer: BC Managed Care – PPO

## 2020-12-27 ENCOUNTER — Ambulatory Visit: Payer: BC Managed Care – PPO | Admitting: Physical Therapy

## 2020-12-30 ENCOUNTER — Ambulatory Visit: Payer: BC Managed Care – PPO

## 2020-12-31 ENCOUNTER — Encounter: Payer: Medicare HMO | Admitting: Physical Therapy

## 2021-01-02 ENCOUNTER — Other Ambulatory Visit: Payer: Self-pay

## 2021-01-02 ENCOUNTER — Ambulatory Visit: Payer: BC Managed Care – PPO

## 2021-01-02 DIAGNOSIS — M25531 Pain in right wrist: Secondary | ICD-10-CM

## 2021-01-02 DIAGNOSIS — M6281 Muscle weakness (generalized): Secondary | ICD-10-CM

## 2021-01-02 DIAGNOSIS — M25631 Stiffness of right wrist, not elsewhere classified: Secondary | ICD-10-CM

## 2021-01-02 NOTE — Therapy (Addendum)
[facility] [address]  [place], [place], [address] Phone: [phone]   Fax:  [phone]  Physical Therapy Treatment  Patient Details  Name: Robin Schmidt MRN: [id number] Date of Birth: [birth year] Referring Provider (PT): Jasper Loser, MD   Progress Note Reporting Period 12/04/20 to 01/02/21  See note below for Objective Data and Assessment of Progress/Goals.     Encounter Date: 01/02/2021   PT End of Session - 01/02/21 1537    Visit Number 3    Number of Visits 13    Date for PT Re-Evaluation 01/15/21    Authorization Type Aetna Medicare, BCBS, Traditional Medicaid    PT Start Time 5110    PT Stop Time 1542    PT Time Calculation (min) 49 min    Activity Tolerance Patient tolerated treatment well;Patient limited by pain    Behavior During Therapy [facility] for tasks assessed/performed           Past Medical History:  Diagnosis Date  . Anxiety   . Back pain, chronic   . Depression     Past Surgical History:  Procedure Laterality Date  . ANTERIOR CERVICAL DECOMP/DISCECTOMY FUSION N/A 11/22/2013   Procedure: ANTERIOR CERVICAL DECOMPRESSION/DISCECTOMY FUSION 1 LEVEL;  Surgeon: Sinclair Ship, MD;  Location: [place];  Service: Orthopedics;  Laterality: N/A;  Anterior cervical decompression fusion, cervical 5-6 with instrumentation and allograft  . back fusion    . BACK SURGERY    . KNEE ARTHROSCOPY    . LAPAROSCOPIC GASTRIC SLEEVE RESECTION    . TUBAL LIGATION      There were no vitals filed for this visit.   Subjective Assessment - 01/02/21 1456    Subjective Pt has been out for 3 weeks trying make appointments work with work schedule, does report constant pain in R wrist.    Pertinent History depression, chronic back pain, anxiety, ACDF C5-6 2015, back fusion, knee arthroscopy    Diagnostic tests 11/02/20 R wrist xray: No acute fractures. Patchy marrow edema in the distal radius. Minimal joint  effusions. granulation tissue or synovitis adjacent to the ulnar styloid at the TFCC periphery. mild increased T2 signal involving the central zone of ECU tendon. Mild dorsal carpal soft tissue swelling which may represent dorsal capsule sprain    Patient Stated Goals decrease pain    Currently in Pain? Yes    Pain Score 8     Pain Location Wrist    Pain Orientation Distal;Right;Anterior;Posterior    Pain Descriptors / Indicators Tingling;Sharp    Pain Type Chronic pain              [facility] PT Assessment - 01/02/21 0001      AROM   Right Wrist Extension 32 Degrees   pain   Right Wrist Flexion 40 Degrees    Right Wrist Radial Deviation 28 Degrees    Right Wrist Ulnar Deviation 24 Degrees      Strength   Right Forearm Pronation 4/5   pain   Right Forearm Supination 4/5    Left Forearm Pronation --    Left Forearm Supination --    Right Wrist Flexion 4-/5    Right Wrist Extension 4-/5    Right Wrist Radial Deviation 4/5    Right Wrist Ulnar Deviation 4-/5    Right Hand Grip (lbs) 17  [facility] Adult PT Treatment/Exercise - 01/02/21 0001      Modalities   Modalities Cryotherapy      Cryotherapy   Number Minutes Cryotherapy 10 Minutes    Cryotherapy Location Wrist    Type of Cryotherapy Ice pack      Manual Therapy   Manual Therapy Soft tissue mobilization;Myofascial release;Joint mobilization;Passive ROM    Manual therapy comments sitting    Soft tissue mobilization STM to R wrist extensors and flexors                    PT Short Term Goals - 12/11/20 1557      PT SHORT TERM GOAL #1   Title Patient to be independent with initial HEP.    Time 2    Period Weeks    Status Achieved    Target Date 12/18/20             PT Long Term Goals - 01/02/21 1513      PT LONG TERM GOAL #1   Title Patient to be independent with advanced HEP.    Time 6    Period Weeks    Status On-going      PT LONG TERM GOAL #2   Title  Patient to demonstrate R wrist strength >/=4+/5 and grip strength >20lbs.    Time 6    Period Weeks    Status Partially Met      PT LONG TERM GOAL #3   Title Patient to report 60% improvement in frequency/intensity of R hand N/T.    Time 6    Period Weeks    Status Partially Met      PT LONG TERM GOAL #4   Title Patient to report tolerance for work duties without pain limiting.    Time 6    Period Weeks    Status Partially Met                 Plan - 01/02/21 1538    Clinical Impression Statement Pt has missed therapy for 3 weeks, reports that she has been trying to find time to come in for a PT appointment to work with her schedule. Pt did note that she was able to complete work activities but still has pain in her wrist while doing them. Pt still has N/T in her R wrist during house hold and work activites, noted that he has gotten better but not by much. Pt did have pain with wrist extension and resisted forearm pronation. STM given to wrist extensors and flexors with good response from pt. Coldpack applied to distal wrist post session to reduce inflammation.    Personal Factors and Comorbidities Age;Comorbidity 3+;Fitness;Past/Current Experience;Profession;Time since onset of injury/illness/exacerbation    Comorbidities depression, chronic back pain, anxiety, ACDF C5-6 2015, back fusion, knee arthroscopy    PT Frequency 2x / week    PT Duration 6 weeks    PT Treatment/Interventions ADLs/Self Care Home Management;Cryotherapy;Electrical Stimulation;Iontophoresis 79m/ml Dexamethasone;Moist Heat;Therapeutic exercise;Therapeutic activities;Functional mobility training;Ultrasound;Neuromuscular re-education;Patient/family education;Manual techniques;Vasopneumatic Device;Taping;Energy conservation;Dry needling;Passive range of motion    PT Next Visit Plan progress wrist AROM and strength    Consulted and Agree with Plan of Care Patient           Patient will benefit from skilled  therapeutic intervention in order to improve the following deficits and impairments:  Hypomobility,Increased edema,Decreased activity tolerance,Decreased strength,Increased fascial restricitons,Impaired UE functional use,Pain,Improper body mechanics,Decreased range of motion,Impaired flexibility,Postural dysfunction  Visit Diagnosis: Pain in right wrist  Stiffness of right wrist, not elsewhere classified  Muscle weakness (generalized)     Problem List Patient Active Problem List   Diagnosis Date Noted  . Radiculopathy 11/22/2013  . Menometrorrhagia 06/08/2012    Class: Chronic    Artist Pais, PTA 01/02/2021, 4:04 PM  [facility] [address]  [place], [place], [address] Phone: [phone]   Fax:  [phone]  Name: Robin Schmidt MRN: [id number] Date of Birth: 1966-10-29  PHYSICAL THERAPY DISCHARGE SUMMARY  Visits from Start of Care: 3  Current functional level related to goals / functional outcomes: Unable to assess; patient did not return   Remaining deficits: See above measurements   Education / Equipment: HEP  Plan: Patient agrees to discharge.  Patient goals were not met. Patient is being discharged due to not returning since the last visit.  ?????     Janene Harvey, PT, DPT 02/24/21 1:48 PM

## 2021-01-03 ENCOUNTER — Encounter: Payer: Medicare HMO | Admitting: Physical Therapy

## 2021-01-06 ENCOUNTER — Ambulatory Visit: Payer: BC Managed Care – PPO

## 2021-01-09 ENCOUNTER — Encounter: Payer: Medicare HMO | Admitting: Physical Therapy

## 2021-01-13 ENCOUNTER — Ambulatory Visit: Payer: Medicare HMO | Attending: Orthopaedic Surgery

## 2021-01-15 ENCOUNTER — Encounter: Payer: Medicare HMO | Admitting: Physical Therapy

## 2021-01-24 DIAGNOSIS — M545 Low back pain, unspecified: Secondary | ICD-10-CM | POA: Diagnosis not present

## 2021-01-24 DIAGNOSIS — Z9884 Bariatric surgery status: Secondary | ICD-10-CM | POA: Diagnosis not present

## 2021-01-24 DIAGNOSIS — M5090 Cervical disc disorder, unspecified, unspecified cervical region: Secondary | ICD-10-CM | POA: Diagnosis not present

## 2021-01-24 DIAGNOSIS — M25531 Pain in right wrist: Secondary | ICD-10-CM | POA: Diagnosis not present

## 2021-01-24 DIAGNOSIS — J310 Chronic rhinitis: Secondary | ICD-10-CM | POA: Diagnosis not present

## 2021-05-20 ENCOUNTER — Ambulatory Visit: Payer: Medicare HMO | Admitting: Family

## 2021-07-25 ENCOUNTER — Ambulatory Visit: Payer: Medicare HMO | Admitting: Family

## 2021-09-06 DIAGNOSIS — Z6828 Body mass index (BMI) 28.0-28.9, adult: Secondary | ICD-10-CM | POA: Diagnosis not present

## 2021-09-06 DIAGNOSIS — E7800 Pure hypercholesterolemia, unspecified: Secondary | ICD-10-CM | POA: Diagnosis not present

## 2021-09-06 DIAGNOSIS — Z7689 Persons encountering health services in other specified circumstances: Secondary | ICD-10-CM | POA: Diagnosis not present

## 2021-09-23 DIAGNOSIS — G8929 Other chronic pain: Secondary | ICD-10-CM | POA: Diagnosis not present

## 2021-09-23 DIAGNOSIS — Z6829 Body mass index (BMI) 29.0-29.9, adult: Secondary | ICD-10-CM | POA: Diagnosis not present

## 2021-09-23 DIAGNOSIS — H52223 Regular astigmatism, bilateral: Secondary | ICD-10-CM | POA: Diagnosis not present

## 2021-10-20 ENCOUNTER — Emergency Department (HOSPITAL_BASED_OUTPATIENT_CLINIC_OR_DEPARTMENT_OTHER)
Admission: EM | Admit: 2021-10-20 | Discharge: 2021-10-20 | Disposition: A | Discharge status: Home / Self Care | Payer: BC Managed Care – PPO | Attending: Emergency Medicine | Admitting: Emergency Medicine

## 2021-10-20 ENCOUNTER — Encounter (HOSPITAL_BASED_OUTPATIENT_CLINIC_OR_DEPARTMENT_OTHER): Payer: Self-pay | Admitting: Emergency Medicine

## 2021-10-20 ENCOUNTER — Other Ambulatory Visit: Payer: Self-pay

## 2021-10-20 DIAGNOSIS — R350 Frequency of micturition: Secondary | ICD-10-CM | POA: Insufficient documentation

## 2021-10-20 DIAGNOSIS — Z79899 Other long term (current) drug therapy: Secondary | ICD-10-CM | POA: Diagnosis not present

## 2021-10-20 DIAGNOSIS — M25561 Pain in right knee: Secondary | ICD-10-CM | POA: Insufficient documentation

## 2021-10-20 LAB — URINALYSIS, ROUTINE W REFLEX MICROSCOPIC
Bilirubin Urine: NEGATIVE
Glucose, UA: NEGATIVE mg/dL
Hgb urine dipstick: NEGATIVE
Ketones, ur: NEGATIVE mg/dL
Leukocytes,Ua: NEGATIVE
Nitrite: NEGATIVE
Protein, ur: NEGATIVE mg/dL
Specific Gravity, Urine: >=1.03 (ref 1.005–1.030)
pH: 6.5 (ref 5.0–8.0)

## 2021-10-20 MED ORDER — ACETAMINOPHEN 500 MG PO TABS
1000.0000 mg | ORAL_TABLET | Freq: Once | ORAL | Status: AC
Start: 2021-10-20 — End: 2021-10-20
  Administered 2021-10-20: 1000 mg via ORAL
  Filled 2021-10-20: qty 2

## 2021-10-20 NOTE — ED Provider Notes (Signed)
[facility] EMERGENCY DEPARTMENT Provider Note  CSN: [id number] Arrival date & time: 10/20/21 0546  Chief Complaint(s) Knee Pain and Urinary Frequency (/)  HPI Robin Schmidt is a 55 y.o. female    Knee Pain Location:  Knee Time since incident:  1 day Injury: no   Knee location:  R knee Pain details:    Quality:  Aching   Radiates to:  Does not radiate   Severity:  Moderate   Onset quality:  Gradual   Duration:  1 day   Timing:  Constant   Progression:  Waxing and waning Chronicity:  New Dislocation: no   Relieved by:  Immobilization Worsened by:  Bearing weight, flexion and extension Ineffective treatments:  None tried Associated symptoms: no decreased ROM, no numbness and no stiffness   Urinary Frequency This is a new problem. Episode onset: 1 week. The problem occurs daily. The problem has not changed since onset.Pertinent negatives include no chest pain, no abdominal pain, no headaches and no shortness of breath. Nothing aggravates the symptoms. Nothing relieves the symptoms.  Noted dysuria earlier in the week that resolved. Feels like she cannot fully empty her bladder.  Past Medical History Past Medical History:  Diagnosis Date   Anxiety    Back pain, chronic    Depression    Patient Active Problem List   Diagnosis Date Noted   Radiculopathy 11/22/2013   Menometrorrhagia 06/08/2012    Class: Chronic   Home Medication(s) Prior to Admission medications   Medication Sig Start Date End Date Taking? Authorizing Provider  ascorbic acid (VITAMIN C) 1000 MG tablet Take by mouth.    [provider]  calcium citrate-vitamin D (CITRACAL+D) 315-200 MG-UNIT tablet Take by mouth.    [provider]  celecoxib (CELEBREX) 100 MG capsule See admin instructions.    [provider]  cyclobenzaprine (FLEXERIL) 10 MG tablet Take 10 mg by mouth 3 (three) times daily as needed for muscle spasms.    [provider]  DULoxetine  (CYMBALTA) 30 MG capsule Take 30 mg by mouth daily.    [provider]  gabapentin (NEURONTIN) 300 MG capsule Take 300 mg by mouth 2 (two) times daily.      [provider]  GABAPENTIN PO 100 mg Two (2) times a day.    [provider]  ibuprofen (ADVIL,MOTRIN) 600 MG tablet Take 1 tablet (600 mg total) by mouth every 6 (six) hours as needed. 07/23/17   Domenic Moras, PA-C  Multiple Vitamins-Minerals (MULTIVITAMIN WITH MINERALS) tablet Take by mouth.    [provider]  Omega-3 1000 MG CAPS Take by mouth.    [provider]                                                                                                                                    Past Surgical History Past Surgical History:  Procedure Laterality Date  ANTERIOR CERVICAL DECOMP/DISCECTOMY FUSION N/A 11/22/2013   Procedure: ANTERIOR CERVICAL DECOMPRESSION/DISCECTOMY FUSION 1 LEVEL;  Surgeon: Sinclair Ship, MD;  Location: [facility];  Service: Orthopedics;  Laterality: N/A;  Anterior cervical decompression fusion, cervical 5-6 with instrumentation and allograft   back fusion     BACK SURGERY     KNEE ARTHROSCOPY     LAPAROSCOPIC GASTRIC SLEEVE RESECTION     TUBAL LIGATION     Family History No family history on file.  Social History Social History   Tobacco Use   Smoking status: Never   Smokeless tobacco: Never  Substance Use Topics   Alcohol use: No   Drug use: No   Allergies Patient has no known allergies.  Review of Systems Review of Systems  Respiratory:  Negative for shortness of breath.   Cardiovascular:  Negative for chest pain.  Gastrointestinal:  Negative for abdominal pain.  Genitourinary:  Positive for frequency.  Musculoskeletal:  Negative for stiffness.  Neurological:  Negative for headaches.  All other systems are reviewed and are negative for acute change except as noted in the HPI  Physical Exam Vital Signs  I have reviewed the triage vital  signs BP 118/74 (BP Location: Left Arm)   Pulse (!) 58   Temp 98.5 F (36.9 C) (Oral)   Resp 16   Ht 5' (1.524 m)   Wt 77.3 kg   LMP 03/12/2016   SpO2 96%   BMI 33.28 kg/m    Physical Exam Vitals reviewed.  Constitutional:      General: She is not in acute distress.    Appearance: She is well-developed. She is not diaphoretic.  HENT:     Head: Normocephalic and atraumatic.     Right Ear: External ear normal.     Left Ear: External ear normal.     Nose: Nose normal.  Eyes:     General: No scleral icterus.    Conjunctiva/sclera: Conjunctivae normal.  Neck:     Trachea: Phonation normal.  Cardiovascular:     Rate and Rhythm: Normal rate and regular rhythm.  Pulmonary:     Effort: Pulmonary effort is normal. No respiratory distress.     Breath sounds: No stridor.  Abdominal:     General: There is no distension.  Musculoskeletal:        General: Normal range of motion.     Cervical back: Normal range of motion.     Right knee: No swelling. Normal range of motion. Tenderness present over the medial joint line. No MCL laxity or ACL laxity.     Instability Tests: Anterior drawer test negative. Posterior drawer test negative.     Left knee: No swelling. Normal range of motion.  Neurological:     Mental Status: She is alert and oriented to person, place, and time.  Psychiatric:        Behavior: Behavior normal.    ED Results and Treatments Labs (all labs ordered are listed, but only abnormal results are displayed) Labs Reviewed  URINALYSIS, ROUTINE W REFLEX MICROSCOPIC - Abnormal; Notable for the following components:      Result Value   APPearance HAZY (*)    All other components within normal limits  EKG  EKG Interpretation  Date/Time:    Ventricular Rate:    PR Interval:    QRS Duration:   QT Interval:    QTC Calculation:   R Axis:     Text  Interpretation:         Radiology No results found.  Pertinent labs & imaging results that were available during my care of the patient were reviewed by me and considered in my medical decision making (see MDM for details).  Medications Ordered in ED Medications  acetaminophen (TYLENOL) tablet 1,000 mg (1,000 mg Oral Given 10/20/21 0641)                                                                                                                                     Procedures Procedures  (including critical care time)  Medical Decision Making / ED Course I have reviewed the nursing notes for this encounter and the patient's prior records (if available in EHR or on provided paperwork).  Robin Schmidt was evaluated in Emergency Department on 10/20/2021 for the symptoms described in the history of present illness. She was evaluated in the context of the global COVID-19 pandemic, which necessitated consideration that the patient might be at risk for infection with the SARS-CoV-2 virus that causes COVID-19. Institutional protocols and algorithms that pertain to the evaluation of patients at risk for COVID-19 are in a state of rapid change based on information released by regulatory bodies including the CDC and federal and state organizations. These policies and algorithms were followed during the patient's care in the ED.     Knee pain H/o prior knee replacement. Nontraumatic - doubt fracture/dislocation - no need for Xray at this time. Not concerning for septic or inflammatory arthritis Tylenol provided. Supportive management recommended. PCP/Ortho follow up if no improvement  Urinary frequency Bladder scan <50cc UA w/o infection or hematuria  Pertinent labs & imaging results that were available during my care of the patient were reviewed by me and considered in my medical decision making:    Final Clinical Impression(s) / ED Diagnoses Final diagnoses:  Acute pain of right  knee  Urine frequency   The patient appears reasonably screened and/or stabilized for discharge and I doubt any other medical condition or other [facility] requiring further screening, evaluation, or treatment in the ED at this time prior to discharge. Safe for discharge with strict return precautions.  Disposition: Discharge  Condition: Good  I have discussed the results, Dx and Tx plan with the patient/family who expressed understanding and agree(s) with the plan. Discharge instructions discussed at length. The patient/family was given strict return precautions who verbalized understanding of the instructions. No further questions at time of discharge.    ED Discharge Orders     None       Follow Up: Rutha Bouchard, MD [address] [place] [facility] [place] [address] [phone]  Call  as needed  This chart was dictated using voice recognition software.  Despite best efforts to proofread,  errors can occur which can change the documentation meaning.    Fatima Blank, MD 10/20/21 1820

## 2021-10-20 NOTE — ED Notes (Signed)
Bladder scan between 40-45 mL.

## 2021-10-20 NOTE — Discharge Instructions (Addendum)
For pain control you may take at 1000 mg of Tylenol every 8 hours as needed. 

## 2021-10-20 NOTE — ED Triage Notes (Signed)
Reports chronic right knee pain that got worse yesterday.  Hx of procedure in the past.  Also feels like she may have a UTI. Feels like she isn't emptying her bladder when she pees and frequency and low back pain.

## 2021-10-20 NOTE — ED Notes (Signed)
ED Provider at bedside. 

## 2021-11-26 DIAGNOSIS — G894 Chronic pain syndrome: Secondary | ICD-10-CM | POA: Diagnosis not present

## 2021-12-24 DIAGNOSIS — F419 Anxiety disorder, unspecified: Secondary | ICD-10-CM | POA: Diagnosis not present

## 2021-12-24 DIAGNOSIS — R079 Chest pain, unspecified: Secondary | ICD-10-CM | POA: Diagnosis not present

## 2022-03-05 DIAGNOSIS — F411 Generalized anxiety disorder: Secondary | ICD-10-CM | POA: Diagnosis not present

## 2022-03-05 DIAGNOSIS — F331 Major depressive disorder, recurrent, moderate: Secondary | ICD-10-CM | POA: Diagnosis not present

## 2022-03-09 DIAGNOSIS — M545 Low back pain, unspecified: Secondary | ICD-10-CM | POA: Diagnosis not present

## 2022-03-09 DIAGNOSIS — M961 Postlaminectomy syndrome, not elsewhere classified: Secondary | ICD-10-CM | POA: Diagnosis not present

## 2022-03-23 DIAGNOSIS — R350 Frequency of micturition: Secondary | ICD-10-CM | POA: Diagnosis not present

## 2022-04-01 DIAGNOSIS — F331 Major depressive disorder, recurrent, moderate: Secondary | ICD-10-CM | POA: Diagnosis not present

## 2022-04-01 DIAGNOSIS — F411 Generalized anxiety disorder: Secondary | ICD-10-CM | POA: Diagnosis not present

## 2022-04-02 DIAGNOSIS — F411 Generalized anxiety disorder: Secondary | ICD-10-CM | POA: Diagnosis not present

## 2022-04-02 DIAGNOSIS — F331 Major depressive disorder, recurrent, moderate: Secondary | ICD-10-CM | POA: Diagnosis not present

## 2022-05-06 DIAGNOSIS — F331 Major depressive disorder, recurrent, moderate: Secondary | ICD-10-CM | POA: Diagnosis not present

## 2022-05-06 DIAGNOSIS — F411 Generalized anxiety disorder: Secondary | ICD-10-CM | POA: Diagnosis not present

## 2022-05-08 DIAGNOSIS — F331 Major depressive disorder, recurrent, moderate: Secondary | ICD-10-CM | POA: Diagnosis not present

## 2022-05-08 DIAGNOSIS — F411 Generalized anxiety disorder: Secondary | ICD-10-CM | POA: Diagnosis not present

## 2022-06-05 DIAGNOSIS — F411 Generalized anxiety disorder: Secondary | ICD-10-CM | POA: Diagnosis not present

## 2022-06-05 DIAGNOSIS — F331 Major depressive disorder, recurrent, moderate: Secondary | ICD-10-CM | POA: Diagnosis not present

## 2022-07-09 DIAGNOSIS — M25531 Pain in right wrist: Secondary | ICD-10-CM | POA: Diagnosis not present

## 2022-07-09 DIAGNOSIS — M25511 Pain in right shoulder: Secondary | ICD-10-CM | POA: Diagnosis not present

## 2022-07-09 DIAGNOSIS — M5441 Lumbago with sciatica, right side: Secondary | ICD-10-CM | POA: Diagnosis not present

## 2022-07-09 DIAGNOSIS — G8929 Other chronic pain: Secondary | ICD-10-CM | POA: Diagnosis not present

## 2022-07-16 DIAGNOSIS — F331 Major depressive disorder, recurrent, moderate: Secondary | ICD-10-CM | POA: Diagnosis not present

## 2022-07-16 DIAGNOSIS — F411 Generalized anxiety disorder: Secondary | ICD-10-CM | POA: Diagnosis not present

## 2022-07-20 DIAGNOSIS — G894 Chronic pain syndrome: Secondary | ICD-10-CM | POA: Diagnosis not present

## 2022-07-20 DIAGNOSIS — M545 Low back pain, unspecified: Secondary | ICD-10-CM | POA: Diagnosis not present

## 2022-07-20 DIAGNOSIS — M961 Postlaminectomy syndrome, not elsewhere classified: Secondary | ICD-10-CM | POA: Diagnosis not present

## 2022-07-21 DIAGNOSIS — M961 Postlaminectomy syndrome, not elsewhere classified: Secondary | ICD-10-CM | POA: Diagnosis not present

## 2022-07-21 DIAGNOSIS — R300 Dysuria: Secondary | ICD-10-CM | POA: Diagnosis not present

## 2022-07-21 DIAGNOSIS — M25511 Pain in right shoulder: Secondary | ICD-10-CM | POA: Diagnosis not present

## 2022-07-21 DIAGNOSIS — M5441 Lumbago with sciatica, right side: Secondary | ICD-10-CM | POA: Diagnosis not present

## 2022-07-21 DIAGNOSIS — G8929 Other chronic pain: Secondary | ICD-10-CM | POA: Diagnosis not present

## 2022-07-21 DIAGNOSIS — M25531 Pain in right wrist: Secondary | ICD-10-CM | POA: Diagnosis not present

## 2022-07-30 DIAGNOSIS — F411 Generalized anxiety disorder: Secondary | ICD-10-CM | POA: Diagnosis not present

## 2022-07-30 DIAGNOSIS — F331 Major depressive disorder, recurrent, moderate: Secondary | ICD-10-CM | POA: Diagnosis not present

## 2022-08-05 DIAGNOSIS — M25511 Pain in right shoulder: Secondary | ICD-10-CM | POA: Diagnosis not present

## 2022-08-06 DIAGNOSIS — E559 Vitamin D deficiency, unspecified: Secondary | ICD-10-CM | POA: Diagnosis not present

## 2022-08-06 DIAGNOSIS — Z131 Encounter for screening for diabetes mellitus: Secondary | ICD-10-CM | POA: Diagnosis not present

## 2022-08-06 DIAGNOSIS — Z79899 Other long term (current) drug therapy: Secondary | ICD-10-CM | POA: Diagnosis not present

## 2022-08-06 DIAGNOSIS — R5383 Other fatigue: Secondary | ICD-10-CM | POA: Diagnosis not present

## 2022-08-06 DIAGNOSIS — Z1159 Encounter for screening for other viral diseases: Secondary | ICD-10-CM | POA: Diagnosis not present

## 2022-08-06 DIAGNOSIS — E7800 Pure hypercholesterolemia, unspecified: Secondary | ICD-10-CM | POA: Diagnosis not present

## 2022-08-07 DIAGNOSIS — M545 Low back pain, unspecified: Secondary | ICD-10-CM | POA: Diagnosis not present

## 2022-08-07 DIAGNOSIS — K219 Gastro-esophageal reflux disease without esophagitis: Secondary | ICD-10-CM | POA: Diagnosis not present

## 2022-08-07 DIAGNOSIS — R030 Elevated blood-pressure reading, without diagnosis of hypertension: Secondary | ICD-10-CM | POA: Diagnosis not present

## 2022-08-07 DIAGNOSIS — F32A Depression, unspecified: Secondary | ICD-10-CM | POA: Diagnosis not present

## 2022-08-07 DIAGNOSIS — G629 Polyneuropathy, unspecified: Secondary | ICD-10-CM | POA: Diagnosis not present

## 2022-08-07 DIAGNOSIS — F411 Generalized anxiety disorder: Secondary | ICD-10-CM | POA: Diagnosis not present

## 2022-08-07 DIAGNOSIS — Z604 Social exclusion and rejection: Secondary | ICD-10-CM | POA: Diagnosis not present

## 2022-08-07 DIAGNOSIS — Z95810 Presence of automatic (implantable) cardiac defibrillator: Secondary | ICD-10-CM | POA: Diagnosis not present

## 2022-08-07 DIAGNOSIS — I739 Peripheral vascular disease, unspecified: Secondary | ICD-10-CM | POA: Diagnosis not present

## 2022-08-13 DIAGNOSIS — F411 Generalized anxiety disorder: Secondary | ICD-10-CM | POA: Diagnosis not present

## 2022-08-13 DIAGNOSIS — F331 Major depressive disorder, recurrent, moderate: Secondary | ICD-10-CM | POA: Diagnosis not present

## 2022-08-19 DIAGNOSIS — M25511 Pain in right shoulder: Secondary | ICD-10-CM | POA: Diagnosis not present

## 2022-08-21 DIAGNOSIS — M25511 Pain in right shoulder: Secondary | ICD-10-CM | POA: Diagnosis not present

## 2022-08-27 DIAGNOSIS — F331 Major depressive disorder, recurrent, moderate: Secondary | ICD-10-CM | POA: Diagnosis not present

## 2022-08-27 DIAGNOSIS — F411 Generalized anxiety disorder: Secondary | ICD-10-CM | POA: Diagnosis not present

## 2022-09-17 DIAGNOSIS — F411 Generalized anxiety disorder: Secondary | ICD-10-CM | POA: Diagnosis not present

## 2022-09-17 DIAGNOSIS — F331 Major depressive disorder, recurrent, moderate: Secondary | ICD-10-CM | POA: Diagnosis not present

## 2022-10-05 DIAGNOSIS — F411 Generalized anxiety disorder: Secondary | ICD-10-CM | POA: Diagnosis not present

## 2022-10-05 DIAGNOSIS — F331 Major depressive disorder, recurrent, moderate: Secondary | ICD-10-CM | POA: Diagnosis not present

## 2022-10-07 DIAGNOSIS — F411 Generalized anxiety disorder: Secondary | ICD-10-CM | POA: Diagnosis not present

## 2022-10-07 DIAGNOSIS — F331 Major depressive disorder, recurrent, moderate: Secondary | ICD-10-CM | POA: Diagnosis not present

## 2022-11-11 DIAGNOSIS — G894 Chronic pain syndrome: Secondary | ICD-10-CM | POA: Diagnosis not present

## 2022-11-11 DIAGNOSIS — M545 Low back pain, unspecified: Secondary | ICD-10-CM | POA: Diagnosis not present

## 2022-11-11 DIAGNOSIS — M961 Postlaminectomy syndrome, not elsewhere classified: Secondary | ICD-10-CM | POA: Diagnosis not present

## 2022-11-19 DIAGNOSIS — M75120 Complete rotator cuff tear or rupture of unspecified shoulder, not specified as traumatic: Secondary | ICD-10-CM | POA: Insufficient documentation

## 2022-11-19 DIAGNOSIS — M75121 Complete rotator cuff tear or rupture of right shoulder, not specified as traumatic: Secondary | ICD-10-CM | POA: Diagnosis not present

## 2022-11-19 DIAGNOSIS — M25511 Pain in right shoulder: Secondary | ICD-10-CM | POA: Diagnosis not present

## 2022-11-19 DIAGNOSIS — M19011 Primary osteoarthritis, right shoulder: Secondary | ICD-10-CM | POA: Diagnosis not present

## 2022-12-10 DIAGNOSIS — H52223 Regular astigmatism, bilateral: Secondary | ICD-10-CM | POA: Diagnosis not present

## 2022-12-10 HISTORY — PX: ROTATOR CUFF REPAIR: SHX139

## 2022-12-14 DIAGNOSIS — S46011A Strain of muscle(s) and tendon(s) of the rotator cuff of right shoulder, initial encounter: Secondary | ICD-10-CM | POA: Diagnosis not present

## 2022-12-14 DIAGNOSIS — G8918 Other acute postprocedural pain: Secondary | ICD-10-CM | POA: Diagnosis not present

## 2022-12-16 NOTE — Therapy (Incomplete)
OUTPATIENT PHYSICAL THERAPY UPPER EXTREMITY EVALUATION   Patient Name: Robin Schmidt MRN: [id number] DOB:09/15/1966, 57 y.o., female Today's Date: 12/16/2022  END OF SESSION:   Past Medical History:  Diagnosis Date   Anxiety    Back pain, chronic    Depression    Past Surgical History:  Procedure Laterality Date   ANTERIOR CERVICAL DECOMP/DISCECTOMY FUSION N/A 11/22/2013   Procedure: ANTERIOR CERVICAL DECOMPRESSION/DISCECTOMY FUSION 1 LEVEL;  Surgeon: Sinclair Ship, MD;  Location: [place];  Service: Orthopedics;  Laterality: N/A;  Anterior cervical decompression fusion, cervical 5-6 with instrumentation and allograft   back fusion     BACK SURGERY     KNEE ARTHROSCOPY     LAPAROSCOPIC GASTRIC SLEEVE RESECTION     TUBAL LIGATION     Patient Active Problem List   Diagnosis Date Noted   Radiculopathy 11/22/2013   Menometrorrhagia 06/08/2012    Class: Chronic    PCP: Rutha Bouchard, MD  REFERRING PROVIDER: Netta Cedars, MD  REFERRING DIAG: [phone] (ICD-10-CM) - Complete rotator cuff tear or rupture of right shoulder, not specified as traumatic  (Right ) THERAPY DIAG:  No diagnosis found.  Rationale for Evaluation and Treatment: Rehabilitation  ONSET DATE: 12/14/22 R Shoulder Scope/ Mini Open Rotator Cuff Repair    NEXT MD VISIT: ***  SUBJECTIVE:                                                                                                                                                                                      SUBJECTIVE STATEMENT: ***  PERTINENT HISTORY: Radiculopathy, Chronic back pain, Depression, OA of AC joint   PAIN:  Are you having pain? {OPRCPAIN:27236}  PRECAUTIONS: {Therapy precautions:24002}  WEIGHT BEARING RESTRICTIONS: {Yes ***/No:24003}  FALLS:  Has patient fallen in last 6 months? {fallsyesno:27318}  LIVING ENVIRONMENT: Lives with: {OPRC lives with:25569::"lives with their family"} Lives in: {Lives in:25570} Stairs:  {opstairs:27293} Has following equipment at home: {Assistive devices:23999}  OCCUPATION: ***  PLOF: {PLOF:24004}  LEISURE ACTIVITIES:   PATIENT GOALS: ***    OBJECTIVE:   DIAGNOSTIC FINDINGS:  11/19/22 R Shoulder Scope: MRI scan reviewed in detail with patient showing a high-grade partial-thickness versus full-thickness supraspinatus rotator cuff tear. No muscular atrophy. There is some degenerative change in the superior labrum with questionable tear. Biceps located in the biceps groove. Subscap okay. Articular cartilage looks normal. AC joint with arthritis and slight outlet impingement.   PATIENT SURVEYS :  {rehab surveys:24030:a}  COGNITION: Overall cognitive status: {cognition:24006}     SENSATION: {sensation:27233}  POSTURE: ***  UPPER EXTREMITY ROM:   {AROM/PROM:27142} ROM Right eval Left eval  Shoulder flexion    Shoulder extension  Shoulder abduction    Shoulder adduction    Shoulder internal rotation    Shoulder external rotation    Elbow flexion    Elbow extension    Wrist flexion    Wrist extension    Wrist ulnar deviation    Wrist radial deviation    Wrist pronation    Wrist supination    (Blank rows = not tested)  UPPER EXTREMITY MMT:  MMT Right eval Left eval  Shoulder flexion    Shoulder extension    Shoulder abduction    Shoulder adduction    Shoulder internal rotation    Shoulder external rotation    Middle trapezius    Lower trapezius    Elbow flexion    Elbow extension    Wrist flexion    Wrist extension    Wrist ulnar deviation    Wrist radial deviation    Wrist pronation    Wrist supination    Grip strength (lbs)    (Blank rows = not tested)  SHOULDER SPECIAL TESTS: Impingement tests: {shoulder impingement test:25231:a} SLAP lesions: {SLAP lesions:25232} Instability tests: {shoulder instability test:25233} Rotator cuff assessment: {rotator cuff assessment:25234} Biceps assessment: {biceps  assessment:25235}  JOINT MOBILITY TESTING:  ***  PALPATION:  ***   TODAY'S TREATMENT:                                                                                                                                         DATE: ***  12/17/22  Initial Eval   Pendulum PROM Flexion to 90, NO ABDUCTION, IR/ER wand Proper Sling wear    PATIENT EDUCATION: Education details: *** Person educated: {Person educated:25204} Education method: {Education Method:25205} Education comprehension: {Education Comprehension:25206}  HOME EXERCISE PROGRAM: ***  ASSESSMENT:  CLINICAL IMPRESSION: Patient is a 57 y.o. female who was seen today for physical therapy evaluation and treatment for ***.    OBJECTIVE IMPAIRMENTS: {opptimpairments:25111}.   ACTIVITY LIMITATIONS: {activitylimitations:27494}  PARTICIPATION LIMITATIONS: {participationrestrictions:25113}  PERSONAL FACTORS: {Personal factors:25162} are also affecting patient's functional outcome.   REHAB POTENTIAL: {rehabpotential:25112}  CLINICAL DECISION MAKING: {clinical decision making:25114}  EVALUATION COMPLEXITY: {Evaluation complexity:25115}  GOALS: Goals reviewed with patient? Yes  SHORT TERM GOALS: Target date: ***  Pt will be independent with original HEP. Baseline: Goal status: INITIAL  2.  Pt will verbalize a decrease in pain by 25-50% to increase her QOL and increase her involvement in her community. Baseline:  Goal status: INITIAL  3.  *** Baseline:  Goal status: {GOALSTATUS:25110}  4.  *** Baseline:  Goal status: {GOALSTATUS:25110}  5.  *** Baseline:  Goal status: {GOALSTATUS:25110}  6.  *** Baseline:  Goal status: {GOALSTATUS:25110}  LONG TERM GOALS: Target date: ***  Pt will demonstrate independence with ongoing HEP. Baseline:  Goal status: INITIAL  2.  Pt will report a score of ___ on the ____ to demonstrate an increase in function and decrease in pain.  Baseline:  Goal status:  INITIAL  3.  Pt will increase _____ strength to a ___/5 to address deficits seen above and improve overall function Baseline:  Goal status: {GOALSTATUS:25110}  4.  Pt will increase ______ ROM by ____  to address deficits seen above and improve overall function Baseline:  Goal status: {GOALSTATUS:25110}  5.  Patient to demonstrate improved upright posture with posterior shoulder girdle engaged to promote improved glenohumeral joint mobility Baseline:  Goal status: INITIAL  6.  Pt will verbalize a decrease in pain of 50-75% to increase her QOL and increase her involvement in her community Baseline:  Goal status: {GOALSTATUS:25110}  PLAN: PT FREQUENCY: 2x/week  PT DURATION: 6 weeks  PLANNED INTERVENTIONS: {rehab planned interventions:25118::"Therapeutic exercises","Therapeutic activity","Neuromuscular re-education","Balance training","Gait training","Patient/Family education","Self Care","Joint mobilization"}  PLAN FOR NEXT SESSION: ***   Armie Moren Romero-Perozo, Student-PT 12/16/2022, 8:35 AM

## 2022-12-17 ENCOUNTER — Ambulatory Visit: Payer: Medicare HMO | Admitting: Physical Therapy

## 2022-12-21 ENCOUNTER — Ambulatory Visit: Payer: Medicare HMO | Admitting: Physical Therapy

## 2022-12-24 ENCOUNTER — Encounter: Payer: Medicare HMO | Admitting: Physical Therapy

## 2022-12-28 DIAGNOSIS — Z0100 Encounter for examination of eyes and vision without abnormal findings: Secondary | ICD-10-CM | POA: Diagnosis not present

## 2022-12-31 ENCOUNTER — Encounter: Payer: Medicare HMO | Admitting: Physical Therapy

## 2023-01-01 DIAGNOSIS — F331 Major depressive disorder, recurrent, moderate: Secondary | ICD-10-CM | POA: Diagnosis not present

## 2023-01-01 DIAGNOSIS — F411 Generalized anxiety disorder: Secondary | ICD-10-CM | POA: Diagnosis not present

## 2023-01-06 NOTE — Therapy (Signed)
OUTPATIENT PHYSICAL THERAPY UPPER EXTREMITY EVALUATION   Patient Name: Robin Schmidt MRN: [id number] DOB:[birth year], 57 y.o., female Today's Date: 01/07/2023  END OF SESSION:  PT End of Session - 01/07/23 1024     Visit Number 1    Date for PT Re-Evaluation 03/04/23    Authorization Type [employer]    PT Start Time 1025   Pt arrived late   PT Stop Time 1115    PT Time Calculation (min) 50 min    Activity Tolerance Patient limited by pain    Behavior During Therapy [facility] for tasks assessed/performed             Past Medical History:  Diagnosis Date   Anxiety    Back pain, chronic    Depression    Past Surgical History:  Procedure Laterality Date   ANTERIOR CERVICAL DECOMP/DISCECTOMY FUSION N/A 11/22/2013   Procedure: ANTERIOR CERVICAL DECOMPRESSION/DISCECTOMY FUSION 1 LEVEL;  Surgeon: Sinclair Ship, MD;  Location: [place];  Service: Orthopedics;  Laterality: N/A;  Anterior cervical decompression fusion, cervical 5-6 with instrumentation and allograft   back fusion     BACK SURGERY     KNEE ARTHROSCOPY     LAPAROSCOPIC GASTRIC SLEEVE RESECTION     TUBAL LIGATION     Patient Active Problem List   Diagnosis Date Noted   Radiculopathy 11/22/2013   Menometrorrhagia 06/08/2012    Class: Chronic    PCP: Rutha Bouchard, MD  REFERRING PROVIDER: Netta Cedars, MD  REFERRING DIAG: [phone] (ICD-10-CM) - Complete rotator cuff tear or rupture of right shoulder, not specified as traumatic   THERAPY DIAG:  Acute pain of right shoulder  Stiffness of right shoulder, not elsewhere classified  Muscle weakness (generalized)  RATIONALE FOR EVALUATION AND TREATMENT: Rehabilitation  ONSET DATE: 12/14/22 - R Shoulder Scope/ Mini Open Rotator Cuff Repair    NEXT MD VISIT: 01/26/23   SUBJECTIVE:                                                                                                                                                                                       SUBJECTIVE STATEMENT: Wears the sling outside of the house, puts it on when she goes to sleep, does pendulums, slight shoulder flexion, can bend elbow to forearm level.   PAIN:  Are you having pain? Yes: NPRS scale: 8 currently, 10 worst, /10 Pain location: inside the shoulder at joint  Pain description: a lot of spasms, sharp   Aggravating factors: night time  Relieving factors: medication, warm shower, ice   PERTINENT HISTORY: Radiculopathy, Chronic back pain, Depression, OA of AC joint, L shoulder surgery - SAD/DCR 08/06/15  PRECAUTIONS: Shoulder  WEIGHT BEARING RESTRICTIONS: No  FALLS:  Has patient fallen in last 6 months? Yes. Number of falls 1 August fell in the tub due to LOB   LIVING ENVIRONMENT: Lives with: lives with their family Lives in: House/apartment Stairs: Yes: Internal: 14 steps; on right going up and External: 1 steps; none Has following equipment at home: Single point cane-does not have right now but will get it soon  OCCUPATION: Currently not working   PLOF: Independent with basic ADLs  LEISURE ACTIVITIES: walking and exercising before back and shoulder issues   PATIENT GOALS: Get ROM back, decrease overall pain     OBJECTIVE:   DIAGNOSTIC FINDINGS:  11/19/22 R Shoulder MRI: MRI scan reviewed in detail with patient showing a high-grade partial-thickness versus full-thickness supraspinatus rotator cuff tear. No muscular atrophy. There is some degenerative change in the superior labrum with questionable tear. Biceps located in the biceps groove. Subscap okay. Articular cartilage looks normal. AC joint with arthritis and slight outlet impingement.   PATIENT SURVEYS :  QuickDASH: TBD at next visit   COGNITION: Overall cognitive status: Within functional limits for tasks assessed     SENSATION: N/T down the R arm sometimes, depends on movement, could also be due to wrist injury from 2 years ago  POSTURE: Rounded shoulders, forward head   UPPER  EXTREMITY ROM: L WNL   Passive ROM Right eval  Shoulder flexion 20*   Shoulder extension   Shoulder abduction   Shoulder adduction   Shoulder internal rotation 39*  Shoulder external rotation 18*  Elbow flexion 70*  Elbow extension 8*  Wrist flexion   Wrist extension   Wrist ulnar deviation   Wrist radial deviation   Wrist pronation 77*  Wrist supination 60*  (Blank rows = not tested)  UPPER EXTREMITY MMT: could not test R due to protocol  MMT Right eval Left eval  Shoulder flexion  4  Shoulder extension  5  Shoulder abduction  4+  Shoulder adduction    Shoulder internal rotation  5  Shoulder external rotation  4+  Middle trapezius    Lower trapezius    Elbow flexion  5  Elbow extension  5  Wrist flexion    Wrist extension    Wrist ulnar deviation    Wrist radial deviation    Wrist pronation  5  Wrist supination  5  Grip strength (lbs)  40 lbs   (Blank rows = not tested)  JOINT MOBILITY TESTING:  NT due to protocol   TODAY'S TREATMENT:                                                                                                                                         DATE:   01/07/23  Initial Eval  THERAPEUTIC EXERCISE: to improve flexibility, strength and mobility.  Verbal and tactile cues throughout for technique. Scap retractions x10 PROM  Flexion x10  Seated elbow flexion/extension x10   MODALITIES: Game Ready vasopneumatic compression post session to R shoulder x 10 min, low compression, 34 deg to reduce post-exercise pain and swelling/edema   PATIENT EDUCATION: Education details: Initial POC, Eval findings, Initial HEP  Person educated: Patient Education method: Explanation, Demonstration, Tactile cues, Verbal cues, and Handouts Education comprehension: verbalized understanding, returned demonstration, and needs further education  HOME EXERCISE PROGRAM: Access Code: [email or web address] URL: [email or web address] Date: 01/07/2023 Prepared  by: Verdene Lennert Romero-Perozo  Exercises - Seated Scapular Retraction  - 2 x daily - 7 x weekly - 2 sets - 10 reps - 3-5 seconds hold - Seated Elbow Flexion and Extension AROM  - 2 x daily - 7 x weekly - 2 sets - 10 reps - Seated Shoulder Flexion Towel Slide at Table  - 2 x daily - 7 x weekly - 2 sets - 10 reps   ASSESSMENT:  CLINICAL IMPRESSION: Patient is a 58 y.o. female who was seen today for physical therapy evaluation and treatment for post-RCR. Surgery was on 12/14/22 and pt states she has been feeling a lot pain since date and has been restricted in motion. Pt is following MD protocol and has been getting out of her sling while she is at home to try and move more than usual. R shoulder PROM is very limited due to pain and guarding. Gentle scapular retractions and PROM of R shoulder flexion was conducted to slowly try to regain partial ROM  of the shoulder. Pt was limited by pain during today's session but will benefit from skilled PT interventions to address her ROM deficits to decrease her pain and increase her overall function and independence with daily activities.   OBJECTIVE IMPAIRMENTS: decreased activity tolerance, decreased endurance, decreased knowledge of use of DME, decreased mobility, decreased ROM, decreased strength, increased edema, increased fascial restrictions, impaired perceived functional ability, increased muscle spasms, impaired flexibility, impaired sensation, impaired UE functional use, improper body mechanics, postural dysfunction, and pain.   ACTIVITY LIMITATIONS: carrying, lifting, bending, sitting, standing, sleeping, bed mobility, bathing, toileting, dressing, reach over head, hygiene/grooming, locomotion level, and caring for others  PARTICIPATION LIMITATIONS: meal prep, cleaning, laundry, driving, shopping, community activity, occupation, and yard work  PERSONAL FACTORS: Past/current experiences and 3+ comorbidities: Radiculopathy, Chronic back pain, Depression,  OA of AC joint, L shoulder surgery - SAD/DCR   are also affecting patient's functional outcome.   REHAB POTENTIAL: Good  CLINICAL DECISION MAKING: Stable/uncomplicated  EVALUATION COMPLEXITY: Low  GOALS: Goals reviewed with patient? Yes  SHORT TERM GOALS: Target date: 01/28/2023  Pt will be independent with original HEP. Baseline: Goal status: INITIAL  2.  Pt will verbalize a decrease in pain by 25% to increase her QOL and increase her tolerance for exercise and ROM. Baseline:  Goal status: INITIAL  LONG TERM GOALS: Target date: 03/04/2023  Pt will demonstrate independence with ongoing HEP. Baseline:  Goal status: INITIAL  2.  Pt will report an improvement in score by 10 points on the QuickDASH to demonstrate an increase in function and decrease in pain.  Baseline: TBD  Goal status: INITIAL  3.  Pt will increase UE strength to grossly >/= 4/5 to address deficits seen above and improve overall function Baseline:  Goal status: INITIAL  4.  Pt will increase R shoulder ROM to [facility] to address deficits seen above and improve overall function Baseline:  Goal status: INITIAL  5.  Patient to demonstrate improved upright posture with posterior shoulder girdle engaged to promote  improved glenohumeral joint mobility Baseline:  Goal status: INITIAL  6.  Pt will verbalize a decrease in pain of 50% to increase her QOL and increase her involvement in her community Baseline:  Goal status: INITIAL  PLAN: PT FREQUENCY: 2x/week  PT DURATION: 6 weeks  PLANNED INTERVENTIONS: Therapeutic exercises, Therapeutic activity, Neuromuscular re-education, Patient/Family education, Self Care, Joint mobilization, DME instructions, Aquatic Therapy, Dry Needling, Electrical stimulation, Cryotherapy, Moist heat, Taping, Vasopneumatic device, Ultrasound, Manual therapy, and Re-evaluation  PLAN FOR NEXT SESSION: Give her QuickDASH, Review/Update HEP, gentle R shoulder stretching/PROM, follow protocol on  PROM precautions   Brice Kossman Romero-Perozo, Student-PT 01/07/2023, 11:35 AM

## 2023-01-07 ENCOUNTER — Ambulatory Visit: Payer: Medicare HMO | Attending: Orthopedic Surgery | Admitting: Physical Therapy

## 2023-01-07 ENCOUNTER — Other Ambulatory Visit: Payer: Self-pay

## 2023-01-07 DIAGNOSIS — M6281 Muscle weakness (generalized): Secondary | ICD-10-CM | POA: Insufficient documentation

## 2023-01-07 DIAGNOSIS — M25611 Stiffness of right shoulder, not elsewhere classified: Secondary | ICD-10-CM | POA: Diagnosis not present

## 2023-01-07 DIAGNOSIS — M25511 Pain in right shoulder: Secondary | ICD-10-CM | POA: Insufficient documentation

## 2023-01-11 ENCOUNTER — Ambulatory Visit: Payer: Medicare HMO | Attending: Orthopedic Surgery

## 2023-01-11 DIAGNOSIS — M25511 Pain in right shoulder: Secondary | ICD-10-CM | POA: Insufficient documentation

## 2023-01-11 DIAGNOSIS — M25611 Stiffness of right shoulder, not elsewhere classified: Secondary | ICD-10-CM | POA: Insufficient documentation

## 2023-01-11 DIAGNOSIS — M6281 Muscle weakness (generalized): Secondary | ICD-10-CM | POA: Diagnosis not present

## 2023-01-11 NOTE — Therapy (Signed)
OUTPATIENT PHYSICAL THERAPY TREATMENT   Patient Name: Robin Schmidt MRN: [id number] DOB:Nov 11, 1965, 57 y.o., female Today's Date: 01/11/2023  END OF SESSION:  PT End of Session - 01/11/23 1103     Visit Number 2    Date for PT Re-Evaluation 03/04/23    Authorization Type [employer]    PT Start Time 1025   pt late   PT Stop Time 1111    PT Time Calculation (min) 46 min    Activity Tolerance Patient limited by pain    Behavior During Therapy [facility] for tasks assessed/performed              Past Medical History:  Diagnosis Date   Anxiety    Back pain, chronic    Depression    Past Surgical History:  Procedure Laterality Date   ANTERIOR CERVICAL DECOMP/DISCECTOMY FUSION N/A 11/22/2013   Procedure: ANTERIOR CERVICAL DECOMPRESSION/DISCECTOMY FUSION 1 LEVEL;  Surgeon: Sinclair Ship, MD;  Location: [place];  Service: Orthopedics;  Laterality: N/A;  Anterior cervical decompression fusion, cervical 5-6 with instrumentation and allograft   back fusion     BACK SURGERY     KNEE ARTHROSCOPY     LAPAROSCOPIC GASTRIC SLEEVE RESECTION     TUBAL LIGATION     Patient Active Problem List   Diagnosis Date Noted   Radiculopathy 11/22/2013   Menometrorrhagia 06/08/2012    Class: Chronic    PCP: Rutha Bouchard, MD  REFERRING PROVIDER: Netta Cedars, MD  REFERRING DIAG: [phone] (ICD-10-CM) - Complete rotator cuff tear or rupture of right shoulder, not specified as traumatic   THERAPY DIAG:  Acute pain of right shoulder  Stiffness of right shoulder, not elsewhere classified  Muscle weakness (generalized)  RATIONALE FOR EVALUATION AND TREATMENT: Rehabilitation  ONSET DATE: 12/14/22 - R Shoulder Scope/ Mini Open Rotator Cuff Repair    NEXT MD VISIT: 01/26/23   SUBJECTIVE:                                                                                                                                                                                      SUBJECTIVE STATEMENT: Pt  reports having spasms and pain in R shoulder. Trying to wean from medication so not taking any, just dealing with pain.  PAIN:  Are you having pain? Yes: NPRS scale: 7/10 Pain location: inside the shoulder at joint  Pain description: spasms, sharp   Aggravating factors: night time  Relieving factors: medication, warm shower, ice   PERTINENT HISTORY: Radiculopathy, Chronic back pain, Depression, OA of AC joint, L shoulder surgery - SAD/DCR 08/06/15  PRECAUTIONS: Shoulder  WEIGHT BEARING RESTRICTIONS: No  FALLS:  Has patient fallen in  last 6 months? Yes. Number of falls 1 August fell in the tub due to LOB   LIVING ENVIRONMENT: Lives with: lives with their family Lives in: House/apartment Stairs: Yes: Internal: 14 steps; on right going up and External: 1 steps; none Has following equipment at home: Single point cane-does not have right now but will get it soon  OCCUPATION: Currently not working   PLOF: Independent with basic ADLs  LEISURE ACTIVITIES: walking and exercising before back and shoulder issues   PATIENT GOALS: Get ROM back, decrease overall pain     OBJECTIVE:   DIAGNOSTIC FINDINGS:  11/19/22 R Shoulder MRI: MRI scan reviewed in detail with patient showing a high-grade partial-thickness versus full-thickness supraspinatus rotator cuff tear. No muscular atrophy. There is some degenerative change in the superior labrum with questionable tear. Biceps located in the biceps groove. Subscap okay. Articular cartilage looks normal. AC joint with arthritis and slight outlet impingement.   PATIENT SURVEYS :  QuickDASH: TBD at next visit   COGNITION: Overall cognitive status: Within functional limits for tasks assessed     SENSATION: N/T down the R arm sometimes, depends on movement, could also be due to wrist injury from 2 years ago  POSTURE: Rounded shoulders, forward head   UPPER EXTREMITY ROM: L WNL   Passive ROM Right eval  Shoulder flexion 20*   Shoulder  extension   Shoulder abduction   Shoulder adduction   Shoulder internal rotation 39*  Shoulder external rotation 18*  Elbow flexion 70*  Elbow extension 8*  Wrist flexion   Wrist extension   Wrist ulnar deviation   Wrist radial deviation   Wrist pronation 77*  Wrist supination 60*  (Blank rows = not tested)  UPPER EXTREMITY MMT: could not test R due to protocol  MMT Right eval Left eval  Shoulder flexion  4  Shoulder extension  5  Shoulder abduction  4+  Shoulder adduction    Shoulder internal rotation  5  Shoulder external rotation  4+  Middle trapezius    Lower trapezius    Elbow flexion  5  Elbow extension  5  Wrist flexion    Wrist extension    Wrist ulnar deviation    Wrist radial deviation    Wrist pronation  5  Wrist supination  5  Grip strength (lbs)  40 lbs   (Blank rows = not tested)  JOINT MOBILITY TESTING:  NT due to protocol   TODAY'S TREATMENT:                                                                                                                                         DATE:  01/11/23 Therapeutic Exercise: to improve strength and mobility.  Demo, verbal and tactile cues throughout for technique.  Seated scap retraction 10x5" Seated shoulder rolls x 10  Seated elbow flexion x 10 Reviewed wrist AROM exercises Seated table slide  into flexion x 10  Manual Therapy: to decrease muscle spasm, pain and improve mobility R shld gentle PROM flex,ER/IR  01/07/23  Initial Eval  THERAPEUTIC EXERCISE: to improve flexibility, strength and mobility.  Verbal and tactile cues throughout for technique. Scap retractions x10 PROM Flexion x10  Seated elbow flexion/extension x10   MODALITIES: Game Ready vasopneumatic compression post session to R shoulder x 10 min, low compression, 34 deg to reduce post-exercise pain and swelling/edema   PATIENT EDUCATION: Education details: Initial POC, Eval findings, Initial HEP  Person educated: Patient Education  method: Explanation, Demonstration, Tactile cues, Verbal cues, and Handouts Education comprehension: verbalized understanding, returned demonstration, and needs further education  HOME EXERCISE PROGRAM: Access Code: [email or web address] URL: [email or web address] Date: 01/07/2023 Prepared by: Verdene Lennert Romero-Perozo  Exercises - Seated Scapular Retraction  - 2 x daily - 7 x weekly - 2 sets - 10 reps - 3-5 seconds hold - Seated Elbow Flexion and Extension AROM  - 2 x daily - 7 x weekly - 2 sets - 10 reps - Seated Shoulder Flexion Towel Slide at Table  - 2 x daily - 7 x weekly - 2 sets - 10 reps   ASSESSMENT:  CLINICAL IMPRESSION: Pt arrived late today. Reviewed HEP and post op precautions. Educated on wrist exercises that she could do as well.  Cues given with the table slides to keep passive movement of R shld.Pt wasn't very tolerant of PROM or table slides without pain, she did report that she does not take any of her medication so I advised her to take meds at least before coming to PT to allow for more tolerance of exercise. Concluded session with CP to address swelling and pain post exercise.   OBJECTIVE IMPAIRMENTS: decreased activity tolerance, decreased endurance, decreased knowledge of use of DME, decreased mobility, decreased ROM, decreased strength, increased edema, increased fascial restrictions, impaired perceived functional ability, increased muscle spasms, impaired flexibility, impaired sensation, impaired UE functional use, improper body mechanics, postural dysfunction, and pain.   ACTIVITY LIMITATIONS: carrying, lifting, bending, sitting, standing, sleeping, bed mobility, bathing, toileting, dressing, reach over head, hygiene/grooming, locomotion level, and caring for others  PARTICIPATION LIMITATIONS: meal prep, cleaning, laundry, driving, shopping, community activity, occupation, and yard work  PERSONAL FACTORS: Past/current experiences and 3+ comorbidities:  Radiculopathy, Chronic back pain, Depression, OA of AC joint, L shoulder surgery - SAD/DCR   are also affecting patient's functional outcome.   REHAB POTENTIAL: Good  CLINICAL DECISION MAKING: Stable/uncomplicated  EVALUATION COMPLEXITY: Low  GOALS: Goals reviewed with patient? Yes  SHORT TERM GOALS: Target date: 01/28/2023  Pt will be independent with original HEP. Baseline: Goal status: IN PROGRESS  2.  Pt will verbalize a decrease in pain by 25% to increase her QOL and increase her tolerance for exercise and ROM. Baseline:  Goal status: IN PROGRESS  LONG TERM GOALS: Target date: 03/04/2023  Pt will demonstrate independence with ongoing HEP. Baseline:  Goal status: IN PROGRESS  2.  Pt will report an improvement in score by 10 points on the QuickDASH to demonstrate an increase in function and decrease in pain.  Baseline: TBD  Goal status: IN PROGRESS  3.  Pt will increase UE strength to grossly >/= 4/5 to address deficits seen above and improve overall function Baseline:  Goal status: IN PROGRESS  4.  Pt will increase R shoulder ROM to [facility] to address deficits seen above and improve overall function Baseline:  Goal status: IN PROGRESS  5.  Patient to demonstrate improved upright posture  with posterior shoulder girdle engaged to promote improved glenohumeral joint mobility Baseline:  Goal status: IN PROGRESS  6.  Pt will verbalize a decrease in pain of 50% to increase her QOL and increase her involvement in her community Baseline:  Goal status: IN PROGRESS  PLAN: PT FREQUENCY: 2x/week  PT DURATION: 6 weeks  PLANNED INTERVENTIONS: Therapeutic exercises, Therapeutic activity, Neuromuscular re-education, Patient/Family education, Self Care, Joint mobilization, DME instructions, Aquatic Therapy, Dry Needling, Electrical stimulation, Cryotherapy, Moist heat, Taping, Vasopneumatic device, Ultrasound, Manual therapy, and Re-evaluation  PLAN FOR NEXT SESSION: Give her  QuickDASH, Review/Update HEP, gentle R shoulder stretching/PROM, follow protocol on PROM precautions   Chozen Latulippe L Lurline Caver, PTA 01/11/2023, 11:03 AM

## 2023-01-13 DIAGNOSIS — F331 Major depressive disorder, recurrent, moderate: Secondary | ICD-10-CM | POA: Diagnosis not present

## 2023-01-13 DIAGNOSIS — F411 Generalized anxiety disorder: Secondary | ICD-10-CM | POA: Diagnosis not present

## 2023-01-14 ENCOUNTER — Ambulatory Visit: Payer: Medicare HMO | Admitting: Physical Therapy

## 2023-01-18 ENCOUNTER — Ambulatory Visit: Payer: Medicare HMO

## 2023-01-18 DIAGNOSIS — M6281 Muscle weakness (generalized): Secondary | ICD-10-CM | POA: Diagnosis not present

## 2023-01-18 DIAGNOSIS — M25511 Pain in right shoulder: Secondary | ICD-10-CM

## 2023-01-18 DIAGNOSIS — M25611 Stiffness of right shoulder, not elsewhere classified: Secondary | ICD-10-CM

## 2023-01-18 NOTE — Therapy (Signed)
OUTPATIENT PHYSICAL THERAPY TREATMENT   Patient Name: Robin Schmidt MRN: [id number] DOB:[birth year], 57 y.o., female Today's Date: 01/18/2023  END OF SESSION:  PT End of Session - 01/18/23 1034     Visit Number 3    Date for PT Re-Evaluation 03/04/23    Authorization Type [employer]et[employer]    PT Start Time 1024    PT Stop Time 1116    PT Time Calculation (min) 52 min    Activity Tolerance Patient tolerated treatment well    Behavior During Therapy WFL for tasks assessed/performed              Past Medical History:  Diagnosis Date   Anxiety    Back pain, chronic    Depression    Past Surgical History:  Procedure Laterality Date   ANTERIOR CERVICAL DECOMP/DISCECTOMY FUSION N/A 11/22/2013   Procedure: ANTERIOR CERVICAL DECOMPRESSION/DISCECTOMY FUSION 1 LEVEL;  Surgeon: Sinclair Ship, MD;  Location: [place];  Service: Orthopedics;  Laterality: N/A;  Anterior cervical decompression fusion, cervical 5-6 with instrumentation and allograft   back fusion     BACK SURGERY     KNEE ARTHROSCOPY     LAPAROSCOPIC GASTRIC SLEEVE RESECTION     TUBAL LIGATION     Patient Active Problem List   Diagnosis Date Noted   Radiculopathy 11/22/2013   Menometrorrhagia 06/08/2012    Class: Chronic    PCP: Rutha Bouchard, MD  REFERRING PROVIDER: Netta Cedars, MD  REFERRING DIAG: [phone] (ICD-10-CM) - Complete rotator cuff tear or rupture of right shoulder, not specified as traumatic   THERAPY DIAG:  Acute pain of right shoulder  Stiffness of right shoulder, not elsewhere classified  Muscle weakness (generalized)  RATIONALE FOR EVALUATION AND TREATMENT: Rehabilitation  ONSET DATE: 12/14/22 - R Shoulder Scope/ Mini Open Rotator Cuff Repair    NEXT MD VISIT: 01/26/23   SUBJECTIVE:                                                                                                                                                                                      SUBJECTIVE STATEMENT: Pt  reports taking pain medication now, which has helped exercise tolerance.  PAIN:  Are you having pain? Yes: NPRS scale: 8/10 Pain location: inside the shoulder at joint  Pain description: stiff, sharp, spasm Aggravating factors: night time  Relieving factors: medication, warm shower, ice   PERTINENT HISTORY: Radiculopathy, Chronic back pain, Depression, OA of AC joint, L shoulder surgery - SAD/DCR 08/06/15  PRECAUTIONS: Shoulder  WEIGHT BEARING RESTRICTIONS: No  FALLS:  Has patient fallen in last 6 months? Yes. Number of falls 1 August fell in the tub due to  LOB   LIVING ENVIRONMENT: Lives with: lives with their family Lives in: House/apartment Stairs: Yes: Internal: 14 steps; on right going up and External: 1 steps; none Has following equipment at home: Single point cane-does not have right now but will get it soon  OCCUPATION: Currently not working   PLOF: Independent with basic ADLs  LEISURE ACTIVITIES: walking and exercising before back and shoulder issues   PATIENT GOALS: Get ROM back, decrease overall pain     OBJECTIVE:   DIAGNOSTIC FINDINGS:  11/19/22 R Shoulder MRI: MRI scan reviewed in detail with patient showing a high-grade partial-thickness versus full-thickness supraspinatus rotator cuff tear. No muscular atrophy. There is some degenerative change in the superior labrum with questionable tear. Biceps located in the biceps groove. Subscap okay. Articular cartilage looks normal. AC joint with arthritis and slight outlet impingement.   PATIENT SURVEYS :  QuickDASH: TBD at next visit   COGNITION: Overall cognitive status: Within functional limits for tasks assessed     SENSATION: N/T down the R arm sometimes, depends on movement, could also be due to wrist injury from 2 years ago  POSTURE: Rounded shoulders, forward head   UPPER EXTREMITY ROM: L WNL   Passive ROM Right eval  Shoulder flexion 20*   Shoulder extension   Shoulder abduction   Shoulder  adduction   Shoulder internal rotation 39*  Shoulder external rotation 18*  Elbow flexion 70*  Elbow extension 8*  Wrist flexion   Wrist extension   Wrist ulnar deviation   Wrist radial deviation   Wrist pronation 77*  Wrist supination 60*  (Blank rows = not tested)  UPPER EXTREMITY MMT: could not test R due to protocol  MMT Right eval Left eval  Shoulder flexion  4  Shoulder extension  5  Shoulder abduction  4+  Shoulder adduction    Shoulder internal rotation  5  Shoulder external rotation  4+  Middle trapezius    Lower trapezius    Elbow flexion  5  Elbow extension  5  Wrist flexion    Wrist extension    Wrist ulnar deviation    Wrist radial deviation    Wrist pronation  5  Wrist supination  5  Grip strength (lbs)  40 lbs   (Blank rows = not tested)  JOINT MOBILITY TESTING:  NT due to protocol   TODAY'S TREATMENT:                                                                                                                                         DATE:  01/18/23 Therapeutic Exercise: to improve strength and mobility.  Demo, verbal and tactile cues throughout for technique.  Seated shoulder roll x 15 Seated shoulder squeeze x 15 Seated green pball rolls into flexion bil x 20;  x 20 with R UE Seated ER with wand PROM x 10 Shoulder isometrics seated:  Flexion  5x5" ER 5x5" IR 5x5" Abuction 5x5"  Manual Therapy: to decrease muscle spasm, pain and improve mobility R shld gentle PROM flex,ER/IR  Vaso x 10 min R shoulder low compression 34 deg   01/11/23 Therapeutic Exercise: to improve strength and mobility.  Demo, verbal and tactile cues throughout for technique.  Seated scap retraction 10x5" Seated shoulder rolls x 10  Seated elbow flexion x 10 Reviewed wrist AROM exercises Seated table slide into flexion x 10  Manual Therapy: to decrease muscle spasm, pain and improve mobility R shld gentle PROM flex,ER/IR  01/07/23  Initial Eval  THERAPEUTIC  EXERCISE: to improve flexibility, strength and mobility.  Verbal and tactile cues throughout for technique. Scap retractions x10 PROM Flexion x10  Seated elbow flexion/extension x10   MODALITIES: Game Ready vasopneumatic compression post session to R shoulder x 10 min, low compression, 34 deg to reduce post-exercise pain and swelling/edema   PATIENT EDUCATION: Education details: HEP update Person educated: Patient Education method: Explanation, Demonstration, Tactile cues, Verbal cues, and Handouts Education comprehension: verbalized understanding, returned demonstration, and needs further education  HOME EXERCISE PROGRAM: Access Code: [email or web address] URL: [email or web address] Date: 01/18/2023 Prepared by: Clarene Essex  Exercises - Seated Scapular Retraction  - 2 x daily - 7 x weekly - 2 sets - 10 reps - 3-5 seconds hold - Seated Elbow Flexion and Extension AROM  - 2 x daily - 7 x weekly - 2 sets - 10 reps - Seated Shoulder Flexion Towel Slide at Table  - 2 x daily - 7 x weekly - 2 sets - 10 reps - Isometric Shoulder External Rotation  - 1 x daily - 7 x weekly - 3 sets - 5 reps - 5 sec hold - Isometric Shoulder Internal Rotation  - 1 x daily - 7 x weekly - 3 sets - 5 reps - 5 sec hold - Isometric Shoulder Flexion  - 1 x daily - 7 x weekly - 3 sets - 5 reps - 5 sec hold - Isometric Shoulder Abduction  - 1 x daily - 7 x weekly - 3 sets - 5 reps - 5 sec hold - Seated Shoulder External Rotation AAROM with Cane and Hand in Neutral  - 1 x daily - 7 x weekly - 2 sets - 10 reps   ASSESSMENT:  CLINICAL IMPRESSION: Pt noted improved exercise tolerance coming in today after starting to take pain medication. Continued with exercises to help with ROM of R shoulder passively, adding ER with cane and towel was needed at side to prevent elbow from flaring out. Added isometrics as well with good tolerance from patient. Concluded session with GR to address pain and swelling in  shoulder.  OBJECTIVE IMPAIRMENTS: decreased activity tolerance, decreased endurance, decreased knowledge of use of DME, decreased mobility, decreased ROM, decreased strength, increased edema, increased fascial restrictions, impaired perceived functional ability, increased muscle spasms, impaired flexibility, impaired sensation, impaired UE functional use, improper body mechanics, postural dysfunction, and pain.   ACTIVITY LIMITATIONS: carrying, lifting, bending, sitting, standing, sleeping, bed mobility, bathing, toileting, dressing, reach over head, hygiene/grooming, locomotion level, and caring for others  PARTICIPATION LIMITATIONS: meal prep, cleaning, laundry, driving, shopping, community activity, occupation, and yard work  PERSONAL FACTORS: Past/current experiences and 3+ comorbidities: Radiculopathy, Chronic back pain, Depression, OA of AC joint, L shoulder surgery - SAD/DCR   are also affecting patient's functional outcome.   REHAB POTENTIAL: Good  CLINICAL DECISION MAKING: Stable/uncomplicated  EVALUATION COMPLEXITY: Low  GOALS: Goals reviewed with patient? Yes  SHORT  TERM GOALS: Target date: 01/28/2023  Pt will be independent with original HEP. Baseline: Goal status: IN PROGRESS  2.  Pt will verbalize a decrease in pain by 25% to increase her QOL and increase her tolerance for exercise and ROM. Baseline:  Goal status: IN PROGRESS  LONG TERM GOALS: Target date: 03/04/2023  Pt will demonstrate independence with ongoing HEP. Baseline:  Goal status: IN PROGRESS  2.  Pt will report an improvement in score by 10 points on the QuickDASH to demonstrate an increase in function and decrease in pain.  Baseline: TBD  Goal status: IN PROGRESS  3.  Pt will increase UE strength to grossly >/= 4/5 to address deficits seen above and improve overall function Baseline:  Goal status: IN PROGRESS  4.  Pt will increase R shoulder ROM to [facility] to address deficits seen above and improve  overall function Baseline:  Goal status: IN PROGRESS  5.  Patient to demonstrate improved upright posture with posterior shoulder girdle engaged to promote improved glenohumeral joint mobility Baseline:  Goal status: IN PROGRESS  6.  Pt will verbalize a decrease in pain of 50% to increase her QOL and increase her involvement in her community Baseline:  Goal status: IN PROGRESS  PLAN: PT FREQUENCY: 2x/week  PT DURATION: 6 weeks  PLANNED INTERVENTIONS: Therapeutic exercises, Therapeutic activity, Neuromuscular re-education, Patient/Family education, Self Care, Joint mobilization, DME instructions, Aquatic Therapy, Dry Needling, Electrical stimulation, Cryotherapy, Moist heat, Taping, Vasopneumatic device, Ultrasound, Manual therapy, and Re-evaluation  PLAN FOR NEXT SESSION: Give her QuickDASH, Review/Update HEP, gentle R shoulder stretching/PROM, follow protocol on PROM precautions (5 weeks post op as of 01/18/23)   Artist Pais, PTA 01/18/2023, 11:09 AM

## 2023-01-21 ENCOUNTER — Ambulatory Visit: Payer: Medicare HMO

## 2023-01-26 ENCOUNTER — Ambulatory Visit: Payer: Medicare HMO | Admitting: Physical Therapy

## 2023-01-27 ENCOUNTER — Ambulatory Visit: Payer: Medicare HMO | Admitting: Physical Therapy

## 2023-01-29 DIAGNOSIS — Z3200 Encounter for pregnancy test, result unknown: Secondary | ICD-10-CM | POA: Diagnosis not present

## 2023-01-29 DIAGNOSIS — M545 Low back pain, unspecified: Secondary | ICD-10-CM | POA: Diagnosis not present

## 2023-01-29 DIAGNOSIS — Z9889 Other specified postprocedural states: Secondary | ICD-10-CM | POA: Diagnosis not present

## 2023-01-29 DIAGNOSIS — Z6831 Body mass index (BMI) 31.0-31.9, adult: Secondary | ICD-10-CM | POA: Diagnosis not present

## 2023-01-31 DIAGNOSIS — R269 Unspecified abnormalities of gait and mobility: Secondary | ICD-10-CM | POA: Diagnosis not present

## 2023-02-01 ENCOUNTER — Encounter: Payer: Medicare HMO | Admitting: Physical Therapy

## 2023-02-04 ENCOUNTER — Encounter: Payer: Medicare HMO | Admitting: Physical Therapy

## 2023-02-08 ENCOUNTER — Encounter: Payer: Medicare HMO | Admitting: Physical Therapy

## 2023-02-09 ENCOUNTER — Ambulatory Visit: Payer: Medicare HMO | Attending: Orthopedic Surgery

## 2023-02-09 DIAGNOSIS — M25611 Stiffness of right shoulder, not elsewhere classified: Secondary | ICD-10-CM | POA: Diagnosis not present

## 2023-02-09 DIAGNOSIS — M25511 Pain in right shoulder: Secondary | ICD-10-CM

## 2023-02-09 DIAGNOSIS — R293 Abnormal posture: Secondary | ICD-10-CM | POA: Insufficient documentation

## 2023-02-09 DIAGNOSIS — M62838 Other muscle spasm: Secondary | ICD-10-CM | POA: Diagnosis not present

## 2023-02-09 DIAGNOSIS — M6281 Muscle weakness (generalized): Secondary | ICD-10-CM | POA: Diagnosis not present

## 2023-02-09 NOTE — Therapy (Signed)
OUTPATIENT PHYSICAL THERAPY TREATMENT   Patient Name: Robin Schmidt MRN: [id number] DOB:December 15, 1965, 57 y.o., female Today's Date: 02/09/2023  END OF SESSION:  PT End of Session - 02/09/23 1026     Visit Number 4    Date for PT Re-Evaluation 03/04/23    Authorization Type [employer]    PT Start Time 1019    PT Stop Time 1100    PT Time Calculation (min) 41 min    Activity Tolerance Patient tolerated treatment well    Behavior During Therapy WFL for tasks assessed/performed              Past Medical History:  Diagnosis Date   Anxiety    Back pain, chronic    Depression    Past Surgical History:  Procedure Laterality Date   ANTERIOR CERVICAL DECOMP/DISCECTOMY FUSION N/A 11/22/2013   Procedure: ANTERIOR CERVICAL DECOMPRESSION/DISCECTOMY FUSION 1 LEVEL;  Surgeon: Sinclair Ship, MD;  Location: [facility];  Service: Orthopedics;  Laterality: N/A;  Anterior cervical decompression fusion, cervical 5-6 with instrumentation and allograft   back fusion     BACK SURGERY     KNEE ARTHROSCOPY     LAPAROSCOPIC GASTRIC SLEEVE RESECTION     TUBAL LIGATION     Patient Active Problem List   Diagnosis Date Noted   Radiculopathy 11/22/2013   Menometrorrhagia 06/08/2012    Class: Chronic    PCP: Rutha Bouchard, MD  REFERRING PROVIDER: Netta Cedars, MD  REFERRING DIAG: [phone] (ICD-10-CM) - Complete rotator cuff tear or rupture of right shoulder, not specified as traumatic   THERAPY DIAG:  Acute pain of right shoulder  Stiffness of right shoulder, not elsewhere classified  Muscle weakness (generalized)  RATIONALE FOR EVALUATION AND TREATMENT: Rehabilitation  ONSET DATE: 12/14/22 - R Shoulder Scope/ Mini Open Rotator Cuff Repair    NEXT MD VISIT: 01/26/23   SUBJECTIVE:                                                                                                                                                                                      SUBJECTIVE STATEMENT: Pt  reports MD has ordered home ice pack, waiting to receive it. Still pretty sore R shoulder, superiorly  PAIN:  Are you having pain? Yes: NPRS scale: 8/10 Pain location: inside the shoulder at joint  Pain description: stiff, sharp, spasm Aggravating factors: night time  Relieving factors: medication, warm shower, ice   PERTINENT HISTORY: Radiculopathy, Chronic back pain, Depression, OA of AC joint, L shoulder surgery - SAD/DCR 08/06/15  PRECAUTIONS: Shoulder 02/09/23: new orders received to continue and progress with behind the back, overhead, gentle PRE's with close elbow.  WEIGHT  BEARING RESTRICTIONS: No  FALLS:  Has patient fallen in last 6 months? Yes. Number of falls 1 August fell in the tub due to LOB   LIVING ENVIRONMENT: Lives with: lives with their family Lives in: House/apartment Stairs: Yes: Internal: 14 steps; on right going up and External: 1 steps; none Has following equipment at home: Single point cane-does not have right now but will get it soon  OCCUPATION: Currently not working   PLOF: Independent with basic ADLs  LEISURE ACTIVITIES: walking and exercising before back and shoulder issues   PATIENT GOALS: Get ROM back, decrease overall pain     OBJECTIVE:   DIAGNOSTIC FINDINGS:  11/19/22 R Shoulder MRI: MRI scan reviewed in detail with patient showing a high-grade partial-thickness versus full-thickness supraspinatus rotator cuff tear. No muscular atrophy. There is some degenerative change in the superior labrum with questionable tear. Biceps located in the biceps groove. Subscap okay. Articular cartilage looks normal. AC joint with arthritis and slight outlet impingement.   PATIENT SURVEYS :  QuickDASH: TBD at next visit   COGNITION: Overall cognitive status: Within functional limits for tasks assessed     SENSATION: N/T down the R arm sometimes, depends on movement, could also be due to wrist injury from 2 years ago  POSTURE: Rounded shoulders, forward  head   UPPER EXTREMITY ROM: L WNL   Passive ROM Right eval  Shoulder flexion 20*   Shoulder extension   Shoulder abduction   Shoulder adduction   Shoulder internal rotation 39*  Shoulder external rotation 18*  Elbow flexion 70*  Elbow extension 8*  Wrist flexion   Wrist extension   Wrist ulnar deviation   Wrist radial deviation   Wrist pronation 77*  Wrist supination 60*  (Blank rows = not tested)  UPPER EXTREMITY MMT: could not test R due to protocol  MMT Right eval Left eval  Shoulder flexion  4  Shoulder extension  5  Shoulder abduction  4+  Shoulder adduction    Shoulder internal rotation  5  Shoulder external rotation  4+  Middle trapezius    Lower trapezius    Elbow flexion  5  Elbow extension  5  Wrist flexion    Wrist extension    Wrist ulnar deviation    Wrist radial deviation    Wrist pronation  5  Wrist supination  5  Grip strength (lbs)  40 lbs   (Blank rows = not tested)  JOINT MOBILITY TESTING:  NT due to protocol   TODAY'S TREATMENT:                                                                                                                                         DATE:  02/09/23: Manual: Supine R humerus supported on towel roll retrograde massage R upper arm Supine shaking. Gentle A/P and inf glides gr 2 R gh jt, R Gh jt distraction  L side lying for scapular clocks on R, therapist providing tactile cues   Therex: Supine for AAROM to tolerance R shoulder flex, abd, ER/IR, multiple reps, combined with therapist providing scapular stabilization against ribs Prone for scapular retractions, to engage middle traps and rhomboids. Required repeated cues to relax R elbow, shoulder  Sidelying L for assisted R shoulder ER( therapist provided 75% assist) to engage infraspinatus. Seated for shoulder pulley for 3 min for AAROM R shoulder elevation.   Vaso x 10 min R shoulder low compression 34 deg   01/18/23 Therapeutic Exercise: to improve  strength and mobility.  Demo, verbal and ta-ctile cues throughout for technique.  Seated shoulder roll x 15 Seated shoulder squeeze x 15 Seated green pball rolls into flexion bil x 20;  x 20 with R UE Seated ER with wand PROM x 10 Shoulder isometrics seated:  Flexion 5x5" ER 5x5" IR 5x5" Abuction 5x5"  Manual Therapy: to decrease muscle spasm, pain and improve mobility R shld gentle PROM flex,ER/IR  Vaso x 10 min R shoulder low compression 34 deg   01/11/23 Therapeutic Exercise: to improve strength and mobility.  Demo, verbal and tactile cues throughout for technique.  Seated scap retraction 10x5" Seated shoulder rolls x 10  Seated elbow flexion x 10 Reviewed wrist AROM exercises Seated table slide into flexion x 10  Manual Therapy: to decrease muscle spasm, pain and improve mobility R shld gentle PROM flex,ER/IR  01/07/23  Initial Eval  THERAPEUTIC EXERCISE: to improve flexibility, strength and mobility.  Verbal and tactile cues throughout for technique. Scap retractions x10 PROM Flexion x10  Seated elbow flexion/extension x10   MODALITIES: Game Ready vasopneumatic compression post session to R shoulder x 10 min, low compression, 34 deg to reduce post-exercise pain and swelling/edema   PATIENT EDUCATION: Education details: HEP update Person educated: Patient Education method: Explanation, Demonstration, Tactile cues, Verbal cues, and Handouts Education comprehension: verbalized understanding, returned demonstration, and needs further education  HOME EXERCISE PROGRAM: Access Code: [email or web address] URL: [email or web address] Date: 01/18/2023 Prepared by: Clarene Essex  Exercises - Seated Scapular Retraction  - 2 x daily - 7 x weekly - 2 sets - 10 reps - 3-5 seconds hold - Seated Elbow Flexion and Extension AROM  - 2 x daily - 7 x weekly - 2 sets - 10 reps - Seated Shoulder Flexion Towel Slide at Table  - 2 x daily - 7 x weekly - 2 sets - 10 reps - Isometric  Shoulder External Rotation  - 1 x daily - 7 x weekly - 3 sets - 5 reps - 5 sec hold - Isometric Shoulder Internal Rotation  - 1 x daily - 7 x weekly - 3 sets - 5 reps - 5 sec hold - Isometric Shoulder Flexion  - 1 x daily - 7 x weekly - 3 sets - 5 reps - 5 sec hold - Isometric Shoulder Abduction  - 1 x daily - 7 x weekly - 3 sets - 5 reps - 5 sec hold - Seated Shoulder External Rotation AAROM with Cane and Hand in Neutral  - 1 x daily - 7 x weekly - 2 sets - 10 reps Access Code: [email or web address] URL: [email or web address] Date: 02/09/2023 Prepared by: Vasti Yagi  Exercises - Prone Single Arm Shoulder Horizontal Abduction with Scapular Retraction and Palm Down  - 1 x daily - 7 x weekly - 3 sets - 10 reps - Seated Shoulder Flexion AAROM with Pulley Behind  - 1 x daily - 7 x weekly -  3 sets - 10 reps  ASSESSMENT:  CLINICAL IMPRESSION: Patient returned today for skilled PT to address recovery from R shoulder RCR.  Able to elevate with AAROM to 90 degrees today in supine and with shoulder pulley.  Added in scapular mobility and engagement therex, which she tolerated well.  Progressed with all therex per MD new orders, except did not begin PRE's R elbow yet. She is tender / limited with superior shoulder joint pain once at the 90 degree level.   OBJECTIVE IMPAIRMENTS: decreased activity tolerance, decreased endurance, decreased knowledge of use of DME, decreased mobility, decreased ROM, decreased strength, increased edema, increased fascial restrictions, impaired perceived functional ability, increased muscle spasms, impaired flexibility, impaired sensation, impaired UE functional use, improper body mechanics, postural dysfunction, and pain.   ACTIVITY LIMITATIONS: carrying, lifting, bending, sitting, standing, sleeping, bed mobility, bathing, toileting, dressing, reach over head, hygiene/grooming, locomotion level, and caring for others  PARTICIPATION LIMITATIONS: meal prep, cleaning,  laundry, driving, shopping, community activity, occupation, and yard work  PERSONAL FACTORS: Past/current experiences and 3+ comorbidities: Radiculopathy, Chronic back pain, Depression, OA of AC joint, L shoulder surgery - SAD/DCR   are also affecting patient's functional outcome.   REHAB POTENTIAL: Good  CLINICAL DECISION MAKING: Stable/uncomplicated  EVALUATION COMPLEXITY: Low  GOALS: Goals reviewed with patient? Yes  SHORT TERM GOALS: Target date: 01/28/2023  Pt will be independent with original HEP. Baseline: Goal status: IN PROGRESS  2.  Pt will verbalize a decrease in pain by 25% to increase her QOL and increase her tolerance for exercise and ROM. Baseline:  Goal status: IN PROGRESS  LONG TERM GOALS: Target date: 03/04/2023  Pt will demonstrate independence with ongoing HEP. Baseline:  Goal status: IN PROGRESS  2.  Pt will report an improvement in score by 10 points on the QuickDASH to demonstrate an increase in function and decrease in pain.  Baseline: TBD  Goal status: IN PROGRESS  3.  Pt will increase UE strength to grossly >/= 4/5 to address deficits seen above and improve overall function Baseline:  Goal status: IN PROGRESS  4.  Pt will increase R shoulder ROM to [facility] to address deficits seen above and improve overall function Baseline:  Goal status: IN PROGRESS  5.  Patient to demonstrate improved upright posture with posterior shoulder girdle engaged to promote improved glenohumeral joint mobility Baseline:  Goal status: IN PROGRESS  6.  Pt will verbalize a decrease in pain of 50% to increase her QOL and increase her involvement in her community Baseline:  Goal status: IN PROGRESS  PLAN: PT FREQUENCY: 2x/week  PT DURATION: 6 weeks  PLANNED INTERVENTIONS: Therapeutic exercises, Therapeutic activity, Neuromuscular re-education, Patient/Family education, Self Care, Joint mobilization, DME instructions, Aquatic Therapy, Dry Needling, Electrical stimulation,  Cryotherapy, Moist heat, Taping, Vasopneumatic device, Ultrasound, Manual therapy, and Re-evaluation  PLAN FOR NEXT SESSION:progress with elbow PRE's, also continue with therex to engage scapular mobility and motor recruitment, add serratus motor learning next visit. Griffey Nicasio L Johni Narine, PT 02/09/2023, 3:48 PM

## 2023-02-11 ENCOUNTER — Encounter: Payer: Medicare HMO | Admitting: Physical Therapy

## 2023-02-15 ENCOUNTER — Encounter: Payer: Medicare HMO | Admitting: Physical Therapy

## 2023-02-18 ENCOUNTER — Encounter: Payer: Medicare HMO | Admitting: Physical Therapy

## 2023-03-02 ENCOUNTER — Ambulatory Visit: Payer: Medicare HMO | Admitting: Physical Therapy

## 2023-03-02 DIAGNOSIS — R293 Abnormal posture: Secondary | ICD-10-CM | POA: Diagnosis not present

## 2023-03-02 DIAGNOSIS — M25611 Stiffness of right shoulder, not elsewhere classified: Secondary | ICD-10-CM | POA: Diagnosis not present

## 2023-03-02 DIAGNOSIS — M6281 Muscle weakness (generalized): Secondary | ICD-10-CM | POA: Diagnosis not present

## 2023-03-02 DIAGNOSIS — M62838 Other muscle spasm: Secondary | ICD-10-CM

## 2023-03-02 DIAGNOSIS — M25511 Pain in right shoulder: Secondary | ICD-10-CM | POA: Diagnosis not present

## 2023-03-02 NOTE — Therapy (Addendum)
OUTPATIENT PHYSICAL THERAPY TREATMENT / RE-CERTIFICATION  Progress Note  Reporting Period 01/07/2023 to 03/02/2023   See note below for Objective Data and Assessment of Progress/Goals.      Patient Name: Robin Schmidt MRN: [id number] DOB:1966/06/07, 57 y.o., female Today's Date: 03/02/2023  END OF SESSION:  PT End of Session - 03/02/23 1704     Visit Number 5    Date for PT Re-Evaluation 04/27/23    Authorization Type [employer] - [place]: [place]    PT Start Time 1704    PT Stop Time 1805    PT Time Calculation (min) 61 min    Activity Tolerance Patient tolerated treatment well;Patient limited by pain    Behavior During Therapy [facility] for tasks assessed/performed              Past Medical History:  Diagnosis Date   Anxiety    Back pain, chronic    Depression    Past Surgical History:  Procedure Laterality Date   ANTERIOR CERVICAL DECOMP/DISCECTOMY FUSION N/A 11/22/2013   Procedure: ANTERIOR CERVICAL DECOMPRESSION/DISCECTOMY FUSION 1 LEVEL;  Surgeon: Emilee Hero, MD;  Location: [facility] OR;  Service: Orthopedics;  Laterality: N/A;  Anterior cervical decompression fusion, cervical 5-6 with instrumentation and allograft   back fusion     BACK SURGERY     KNEE ARTHROSCOPY     LAPAROSCOPIC GASTRIC SLEEVE RESECTION     TUBAL LIGATION     Patient Active Problem List   Diagnosis Date Noted   Radiculopathy 11/22/2013   Menometrorrhagia 06/08/2012    Class: Chronic    PCP: Dahlia Bailiff, MD  REFERRING PROVIDER: Beverely Low, MD  REFERRING DIAG: [phone] (ICD-10-CM) - Complete rotator cuff tear or rupture of right shoulder, not specified as traumatic   THERAPY DIAG:  Acute pain of right shoulder  Stiffness of right shoulder, not elsewhere classified  Muscle weakness (generalized)  Abnormal posture  Other muscle spasm  RATIONALE FOR EVALUATION AND TREATMENT: Rehabilitation  ONSET DATE: 12/14/22 - R Shoulder Scope/ Mini Open Rotator Cuff Repair    NEXT MD  VISIT: 03/05/23   SUBJECTIVE:                                                                                                                                                                                      SUBJECTIVE STATEMENT:  Pt returning to PT after 3-week absence while traveling. She feels like her motion is improving but the pain doesn't seem to change. She states insurance and the MD approved a home ice unit but she has had trouble finding one.  PAIN:  Are you having pain? Yes: NPRS scale: 7.5/10 Pain location:  R upper/lateral shoulder  Pain description: sharp, dull, nagging, intermittent Aggravating factors: movement  Relieving factors: medication, warm shower, ice   PERTINENT HISTORY: Radiculopathy, Chronic back pain, Depression, OA of AC joint, L shoulder surgery - SAD/DCR 08/06/15  PRECAUTIONS: Shoulder 02/09/23: new orders received to continue and progress with behind the back, overhead, gentle PRE's with close elbow.  WEIGHT BEARING RESTRICTIONS: No  FALLS:  Has patient fallen in last 6 months? Yes. Number of falls 1 August fell in the tub due to LOB   LIVING ENVIRONMENT: Lives with: lives with their family Lives in: House/apartment Stairs: Yes: Internal: 14 steps; on right going up and External: 1 steps; none Has following equipment at home: Single point cane-does not have right now but will get it soon  OCCUPATION: Currently not working   PLOF: Independent with basic ADLs  LEISURE ACTIVITIES: walking and exercising before back and shoulder issues   PATIENT GOALS: Get ROM back, decrease overall pain    OBJECTIVE:   DIAGNOSTIC FINDINGS:  11/19/22 R Shoulder MRI: MRI scan reviewed in detail with patient showing a high-grade partial-thickness versus full-thickness supraspinatus rotator cuff tear. No muscular atrophy. There is some degenerative change in the superior labrum with questionable tear. Biceps located in the biceps groove. Subscap okay. Articular  cartilage looks normal. AC joint with arthritis and slight outlet impingement.   PATIENT SURVEYS :  QuickDASH: TBD at next visit   COGNITION: Overall cognitive status: Within functional limits for tasks assessed     SENSATION: N/T down the R arm sometimes, depends on movement, could also be due to wrist injury from 2 years ago  POSTURE: Rounded shoulders, forward head   UPPER EXTREMITY ROM: L WNL   Active ROM Left 03/02/23 Right 03/02/23  Shoulder flexion 150 82  Shoulder extension 55 32  Shoulder abduction 145 63  Shoulder adduction    Shoulder internal rotation 74 46  Shoulder external rotation 72 39  Elbow flexion    Elbow extension    Wrist flexion    Wrist extension    Wrist ulnar deviation    Wrist radial deviation    Wrist pronation    Wrist supination     Passive ROM Right eval Right 03/02/23  Shoulder flexion 20*  120  Shoulder extension    Shoulder abduction  76  Shoulder adduction    Shoulder internal rotation 39* 48  Shoulder external rotation 18* 45  Elbow flexion 70*   Elbow extension 8*   Wrist flexion    Wrist extension    Wrist ulnar deviation    Wrist radial deviation    Wrist pronation 77*   Wrist supination 60*   (Blank rows = not tested)  UPPER EXTREMITY MMT: could not test R due to protocol  MMT Left eval Right  Shoulder flexion 4 2+  Shoulder extension 5 3-  Shoulder abduction 4+ 2  Shoulder adduction    Shoulder internal rotation 5 3  Shoulder external rotation 4+ 2+  Middle trapezius    Lower trapezius    Elbow flexion 5   Elbow extension 5   Wrist flexion    Wrist extension    Wrist ulnar deviation    Wrist radial deviation    Wrist pronation 5   Wrist supination 5   Grip strength (lbs) 40 lbs    (Blank rows = not tested)  JOINT MOBILITY TESTING:  NT due to protocol    TODAY'S TREATMENT:  DATE:   03/02/23 THERAPEUTIC EXERCISE: to improve flexibility, strength and mobility.  Verbal and tactile cues throughout for technique. Pulleys: Flexion and scaption x 3 min each S/L R scapular retraction and depression 10 x 3" S/L R scapular retraction + PT assisted AAROM shoulder ER 10 x 3" S/L R scapular retraction + PT assisted AAROM shoulder flexion to 90 10 x 3" - pt having difficulty maintaining scapular engagement Supine wand AAROM R shoulder flexion with PT providing scapular stabilization against ribs x 10 Supine wand AAROM R shoulder protraction x 10 Supine wand AAROM R shoulder scaption/abduction with PT providing scapular stabilization against ribs x 10 Supine wand AAROM R shoulder scaption/abduction with VC & TC for scapular stabilization against ribs x 10, avoiding shoulder hike Standing scapular retraction + YTB shoulder rows 10 x 5" Standing scapular retraction + YTB shoulder extension to neutral 10 x 5"  MANUAL THERAPY: Supine R shoulder - To promote normalized muscle tension, improved flexibility, improved joint mobility, increased ROM, and reduced pain. Gentle GH oscillations to reduce muscle guarding and pain Grade II-III R glenohumeral distraction, inferior and AP glides STM/DTM, XFM and manual TPR to anterior and lateral deltoid  THERAPEUTIC ACTIVITIES: R shoulder ROM assessment Goal assessment    02/09/23: Manual: Supine R humerus supported on towel roll retrograde massage R upper arm Supine shaking. Gentle A/P and inf glides gr 2 R gh jt, R Gh jt distraction L side lying for scapular clocks on R, therapist providing tactile cues   Therex: Supine for AAROM to tolerance R shoulder flex, abd, ER/IR, multiple reps, combined with therapist providing scapular stabilization against ribs Prone for scapular retractions, to engage middle traps and rhomboids. Required repeated cues to relax R elbow, shoulder  Sidelying L for assisted R shoulder ER( therapist  provided 75% assist) to engage infraspinatus. Seated for shoulder pulley for 3 min for AAROM R shoulder elevation.  Vaso x 10 min R shoulder low compression 34 deg    01/18/23 Therapeutic Exercise: to improve strength and mobility.  Demo, verbal and tactile cues throughout for technique.  Seated shoulder roll x 15 Seated shoulder squeeze x 15 Seated green pball rolls into flexion bil x 20;  x 20 with R UE Seated ER with wand PROM x 10 Shoulder isometrics seated:  Flexion 5x5" ER 5x5" IR 5x5" Abuction 5x5"  Manual Therapy: to decrease muscle spasm, pain and improve mobility R shld gentle PROM flex,ER/IR  Vaso x 10 min R shoulder low compression 34 deg    PATIENT EDUCATION: Education details: progress with PT, ongoing PT POC, HEP update, and postural awareness  Person educated: Patient Education method: Explanation, Demonstration, Tactile cues, Verbal cues, and Handouts Education comprehension: verbalized understanding, returned demonstration, and needs further education  HOME EXERCISE PROGRAM: Access Code: [email or web address] URL: [email or web address] Date: 03/02/2023 Prepared by: Glenetta Hew  Exercises - Seated Scapular Retraction  - 2 x daily - 7 x weekly - 2 sets - 10 reps - 3-5 seconds hold - Seated Elbow Flexion and Extension AROM  - 2 x daily - 7 x weekly - 2 sets - 10 reps - Seated Shoulder Flexion Towel Slide at Table  - 2 x daily - 7 x weekly - 2 sets - 10 reps - Isometric Shoulder External Rotation  - 1 x daily - 7 x weekly - 3 sets - 5 reps - 5 sec hold - Isometric Shoulder Internal Rotation  - 1 x daily - 7 x weekly - 3 sets - 5 reps -  5 sec hold - Isometric Shoulder Flexion  - 1 x daily - 7 x weekly - 3 sets - 5 reps - 5 sec hold - Isometric Shoulder Abduction  - 1 x daily - 7 x weekly - 3 sets - 5 reps - 5 sec hold - Seated Shoulder External Rotation AAROM with Cane and Hand in Neutral  - 1 x daily - 7 x weekly - 2 sets - 10 reps - Prone Single Arm  Shoulder Horizontal Abduction with Scapular Retraction and Palm Down  - 1 x daily - 7 x weekly - 2 sets - 10 reps - 3 sec hold - Seated Shoulder Flexion AAROM with Pulley Behind  - 1 x daily - 7 x weekly - 2 sets - 10 reps - 3 sec hold - Supine Shoulder Flexion AAROM with Dowel  - 1 x daily - 7 x weekly - 2 sets - 10 reps - 3 sec hold - Supine Shoulder Protraction with Dowel  - 1 x daily - 7 x weekly - 2 sets - 10 reps - 3 sec hold - Standing Shoulder Abduction ROM with Dowel  - 1 x daily - 7 x weekly - 2 sets - 10 reps - 3 sec hold - Standing Bilateral Low Shoulder Row with Anchored Resistance  - 1 x daily - 7 x weekly - 2 sets - 10 reps - 5 sec hold - Scapular Retraction with Resistance Advanced  - 1 x daily - 7 x weekly - 2 sets - 10 reps - 5 sec hold  ASSESSMENT:  CLINICAL IMPRESSION:  Jazlen returns to PT today after 3-week absence for only 5th visit in an 8-week POC, and only 2 visits since last follow-up with MD.  She reports continued high levels of pain in R shoulder but feels like her motion is improving.  R shoulder A/PROM remains limited in all planes with significant muscle guarding and substitution evident particularly with overhead motions and poorly controlled scapular activation, requiring PT facilitation and guidance of movement patterns for most exercises.  R shoulder strength grossly less than 3/5 due to limited ROM.  Her progress is behind where it should be at 11 weeks postop due to limited therapy attendance and ongoing pain issues.  Caris will benefit from continued skilled PT to address above deficits to improve mobility and activity tolerance with decreased pain interference, therefore will recommend recert for continued PT 2x/wk for up to 8 weeks. She would also likely benefit from dry needling to help reduce abnormal muscle tension and guarding to improve flexibility and ROM tolerance.  OBJECTIVE IMPAIRMENTS: decreased activity tolerance, decreased endurance, decreased  knowledge of use of DME, decreased mobility, decreased ROM, decreased strength, increased edema, increased fascial restrictions, impaired perceived functional ability, increased muscle spasms, impaired flexibility, impaired sensation, impaired UE functional use, improper body mechanics, postural dysfunction, and pain.   ACTIVITY LIMITATIONS: carrying, lifting, bending, sitting, standing, sleeping, bed mobility, bathing, toileting, dressing, reach over head, hygiene/grooming, locomotion level, and caring for others  PARTICIPATION LIMITATIONS: meal prep, cleaning, laundry, driving, shopping, community activity, occupation, and yard work  PERSONAL FACTORS: Past/current experiences and 3+ comorbidities: Radiculopathy, Chronic back pain, Depression, OA of AC joint, L shoulder surgery - SAD/DCR   are also affecting patient's functional outcome.   REHAB POTENTIAL: Good  CLINICAL DECISION MAKING: Stable/uncomplicated  EVALUATION COMPLEXITY: Low   GOALS: Goals reviewed with patient? Yes  SHORT TERM GOALS: Target date: 01/28/2023, extended to 03/30/2023   Pt will be independent with original  HEP. Baseline: Goal status: MET  03/02/23  2.  Pt will verbalize a decrease in pain by 25% to increase her QOL and increase her tolerance for exercise and ROM. Baseline:  Goal status: IN PROGRESS  03/02/23 - patient reports consistent increased levels of pain  LONG TERM GOALS: Target date: 03/04/2023, extended to 04/27/2023   Pt will demonstrate independence with ongoing HEP. Baseline:  Goal status: IN PROGRESS  03/02/23 - HEP updated today  2.  Pt will report an improvement in score by 10 points on the QuickDASH to demonstrate an increase in function and decrease in pain.  Baseline: TBD  Goal status: IN PROGRESS  3.  Pt will increase UE strength to grossly >/= 4/5 to address deficits seen above and improve overall function Baseline:  Goal status: IN PROGRESS  03/02/23 - R shoulder strength grossly less  than 3/5 due to limited ROM  4.  Pt will increase R shoulder ROM to [facility] to address deficits seen above and improve overall function Baseline:  Goal status: IN PROGRESS  03/02/23 - R shoulder A/PROM limited in all planes  5.  Patient to demonstrate improved upright posture with posterior shoulder girdle engaged to promote improved glenohumeral joint mobility Baseline:  Goal status: IN PROGRESS  03/02/23 - decreased scapular activation with increased shoulder hiking evident during shoulder elevation  6.  Pt will verbalize a decrease in pain of 50% to increase her QOL and increase her involvement in her community Baseline:  Goal status: IN PROGRESS  03/02/23 - patient reports consistent increased levels of pain   PLAN:  PT FREQUENCY: 2x/week  PT DURATION: 6 weeks  PLANNED INTERVENTIONS: Therapeutic exercises, Therapeutic activity, Neuromuscular re-education, Patient/Family education, Self Care, Joint mobilization, DME instructions, Aquatic Therapy, Dry Needling, Electrical stimulation, Cryotherapy, Moist heat, Taping, Vasopneumatic device, Ultrasound, Manual therapy, and Re-evaluation  PLAN FOR NEXT SESSION: 11 weeks post-op as of 03/01/23 (surgery 12/14/22) - progress with PRE's, also continue with therex to engage scapular mobility and motor recruitment, add serratus motor learning.    Marry Guan, PT 03/02/2023, 7:04 PM

## 2023-03-03 DIAGNOSIS — M961 Postlaminectomy syndrome, not elsewhere classified: Secondary | ICD-10-CM | POA: Diagnosis not present

## 2023-03-03 DIAGNOSIS — M545 Low back pain, unspecified: Secondary | ICD-10-CM | POA: Diagnosis not present

## 2023-03-09 DIAGNOSIS — M5451 Vertebrogenic low back pain: Secondary | ICD-10-CM | POA: Diagnosis not present

## 2023-03-19 DIAGNOSIS — M5451 Vertebrogenic low back pain: Secondary | ICD-10-CM | POA: Diagnosis not present

## 2023-03-23 DIAGNOSIS — M5451 Vertebrogenic low back pain: Secondary | ICD-10-CM | POA: Diagnosis not present

## 2023-03-30 DIAGNOSIS — M5451 Vertebrogenic low back pain: Secondary | ICD-10-CM | POA: Diagnosis not present

## 2023-04-02 DIAGNOSIS — M5451 Vertebrogenic low back pain: Secondary | ICD-10-CM | POA: Diagnosis not present

## 2023-04-07 DIAGNOSIS — M5451 Vertebrogenic low back pain: Secondary | ICD-10-CM | POA: Diagnosis not present

## 2023-04-13 ENCOUNTER — Ambulatory Visit: Payer: Medicare HMO | Admitting: Physical Therapy

## 2023-04-15 ENCOUNTER — Other Ambulatory Visit: Payer: Self-pay

## 2023-04-15 ENCOUNTER — Ambulatory Visit: Payer: Medicare HMO | Attending: Orthopedic Surgery

## 2023-04-15 DIAGNOSIS — M6281 Muscle weakness (generalized): Secondary | ICD-10-CM | POA: Insufficient documentation

## 2023-04-15 DIAGNOSIS — R293 Abnormal posture: Secondary | ICD-10-CM | POA: Diagnosis present

## 2023-04-15 DIAGNOSIS — M25611 Stiffness of right shoulder, not elsewhere classified: Secondary | ICD-10-CM | POA: Diagnosis present

## 2023-04-15 DIAGNOSIS — M62838 Other muscle spasm: Secondary | ICD-10-CM | POA: Insufficient documentation

## 2023-04-15 DIAGNOSIS — M25511 Pain in right shoulder: Secondary | ICD-10-CM | POA: Diagnosis present

## 2023-04-15 NOTE — Therapy (Signed)
OUTPATIENT PHYSICAL THERAPY TREATMENT / RE-CERTIFICATION  Progress Note  Reporting Period 01/07/2023 to 04/15/2023   See note below for Objective Data and Assessment of Progress/Goals.      Patient Name: Robin Schmidt MRN: [id number] DOB:1966-07-15, 57 y.o., female Today's Date: 04/15/2023  END OF SESSION:  PT End of Session - 04/15/23 1230     Visit Number 6    Date for PT Re-Evaluation 06/10/23    Authorization Type [employer] - [place]: [place]    PT Start Time 1020    PT Stop Time 1102    PT Time Calculation (min) 42 min               Past Medical History:  Diagnosis Date   Anxiety    Back pain, chronic    Depression    Past Surgical History:  Procedure Laterality Date   ANTERIOR CERVICAL DECOMP/DISCECTOMY FUSION N/A 11/22/2013   Procedure: ANTERIOR CERVICAL DECOMPRESSION/DISCECTOMY FUSION 1 LEVEL;  Surgeon: Emilee Hero, MD;  Location: [facility] OR;  Service: Orthopedics;  Laterality: N/A;  Anterior cervical decompression fusion, cervical 5-6 with instrumentation and allograft   back fusion     BACK SURGERY     KNEE ARTHROSCOPY     LAPAROSCOPIC GASTRIC SLEEVE RESECTION     TUBAL LIGATION     Patient Active Problem List   Diagnosis Date Noted   Radiculopathy 11/22/2013   Menometrorrhagia 06/08/2012    Class: Chronic    PCP: Dahlia Bailiff, MD  REFERRING PROVIDER: Beverely Low, MD  REFERRING DIAG: [phone] (ICD-10-CM) - Complete rotator cuff tear or rupture of right shoulder, not specified as traumatic   THERAPY DIAG:  Acute pain of right shoulder  Stiffness of right shoulder, not elsewhere classified  Muscle weakness (generalized)  Abnormal posture  Other muscle spasm  RATIONALE FOR EVALUATION AND TREATMENT: Rehabilitation  ONSET DATE: 12/14/22 - R Shoulder Scope/ Mini Open Rotator Cuff Repair    NEXT MD VISIT: 03/05/23   SUBJECTIVE:                                                                                                                                                                                       SUBJECTIVE STATEMENT:  Pt returning to PT after  another 6 week absence .She feels like her motion is still tight, she has been attending aquatic PT for her back in the meantime. the pain doesn't seem to change.  Wants to work out the stiffness R shoulder PAIN:  Are you having pain? Yes: NPRS scale: 7.5/10 Pain location: R upper/lateral shoulder  Pain description: sharp, dull, nagging, intermittent Aggravating factors: movement  Relieving factors: medication, warm  shower, ice   PERTINENT HISTORY: Radiculopathy, Chronic back pain, Depression, OA of AC joint, L shoulder surgery - SAD/DCR 08/06/15  PRECAUTIONS: Shoulder 02/09/23: new orders received to continue and progress with behind the back, overhead, gentle PRE's with close elbow.  WEIGHT BEARING RESTRICTIONS: No  FALLS:  Has patient fallen in last 6 months? Yes. Number of falls 1 August fell in the tub due to LOB   LIVING ENVIRONMENT: Lives with: lives with their family Lives in: House/apartment Stairs: Yes: Internal: 14 steps; on right going up and External: 1 steps; none Has following equipment at home: Single point cane-does not have right now but will get it soon  OCCUPATION: Currently not working   PLOF: Independent with basic ADLs  LEISURE ACTIVITIES: walking and exercising before back and shoulder issues   PATIENT GOALS: Get ROM back, decrease overall pain    OBJECTIVE:   DIAGNOSTIC FINDINGS:  11/19/22 R Shoulder MRI: MRI scan reviewed in detail with patient showing a high-grade partial-thickness versus full-thickness supraspinatus rotator cuff tear. No muscular atrophy. There is some degenerative change in the superior labrum with questionable tear. Biceps located in the biceps groove. Subscap okay. Articular cartilage looks normal. AC joint with arthritis and slight outlet impingement.   PATIENT SURVEYS :  QuickDASH: TBD at next visit    COGNITION: Overall cognitive status: Within functional limits for tasks assessed     SENSATION: N/T down the R arm sometimes, depends on movement, could also be due to wrist injury from 2 years ago  POSTURE: Rounded shoulders, forward head   UPPER EXTREMITY ROM: L WNL   Active ROM Left 03/02/23 Right 03/02/23 R  04/15/23  Shoulder flexion 150 82 96  Shoulder extension 55 32 To L 5  Shoulder abduction 145 63 84  Shoulder adduction     Shoulder internal rotation 74 46   Shoulder external rotation 72 39 To T 2  Elbow flexion     Elbow extension     Wrist flexion     Wrist extension     Wrist ulnar deviation     Wrist radial deviation     Wrist pronation     Wrist supination      Passive ROM Right eval Right 03/02/23 R 04/15/23  Shoulder flexion 20*  120 118  Shoulder extension     Shoulder abduction  76 112  Shoulder adduction     Shoulder internal rotation 39* 48 25  Shoulder external rotation 18* 45 35  Elbow flexion 70*    Elbow extension 8*    Wrist flexion     Wrist extension     Wrist ulnar deviation     Wrist radial deviation     Wrist pronation 77*    Wrist supination 60*    (Blank rows = not tested)  UPPER EXTREMITY MMT: could not test R due to protocol  MMT Left eval Right R 04/15/23  Shoulder flexion 4 2+ 3  Shoulder extension 5 3- 3  Shoulder abduction 4+ 2 3-  Shoulder adduction     Shoulder internal rotation 5 3 3+  Shoulder external rotation 4+ 2+ 3-  Middle trapezius     Lower trapezius     Elbow flexion 5    Elbow extension 5    Wrist flexion     Wrist extension     Wrist ulnar deviation     Wrist radial deviation     Wrist pronation 5    Wrist supination 5  Grip strength (lbs) 40 lbs     (Blank rows = not tested)  JOINT MOBILITY TESTING:  NT due to protocol    TODAY'S TREATMENT:                                                                                                                                         DATE:  04/15/23:  Reassessed today for updated plan of care:  Manual: Supine for inferior glides, R humeral head, 60 sec bouts, 3reps to improve capsular mobility Supine AAROM/ Passive stretching R shoulder all planes  Instructed in therex as described in medbridge app below: Shoulder pulley for 3 min at end of session with marked improvement in R shoulder elevation as compared to initial part of this session. 03/02/23 THERAPEUTIC EXERCISE: to improve flexibility, strength and mobility.  Verbal and tactile cues throughout for technique. Pulleys: Flexion and scaption x 3 min each S/L L scapular retraction and depression 10 x 3" S/L L scapular retraction + PT assisted AAROM shoulder ER 10 x 3" S/L L scapular retraction + PT assisted AAROM shoulder flexion to 90 10 x 3" - pt having difficulty maintaining scapular engagement Supine wand AAROM L shoulder flexion with PT providing scapular stabilization against ribs x 10 Supine wand AAROM L shoulder protraction x 10 Supine wand AAROM L shoulder scaption/abduction with PT providing scapular stabilization against ribs x 10 Supine wand AAROM L shoulder scaption/abduction with VC & TC for scapular stabilization against ribs x 10, avoiding shoulder hike Standing scapular retraction + YTB shoulder rows 10 x 5" Standing scapular retraction + YTB shoulder extension to neutral 10 x 5"  MANUAL THERAPY: Supine R shoulder - To promote normalized muscle tension, improved flexibility, improved joint mobility, increased ROM, and reduced pain. Gentle GH oscillations to reduce muscle guarding and pain Grade II-III R glenohumeral distraction, inferior and AP glides STM/DTM, XFM and manual TPR to anterior and lateral deltoid  THERAPEUTIC ACTIVITIES: R shoulder ROM assessment Goal assessment    02/09/23: Manual: Supine R humerus supported on towel roll retrograde massage R upper arm Supine shaking. Gentle A/P and inf glides gr 2 R gh jt, R Gh jt distraction L side lying for  scapular clocks on R, therapist providing tactile cues   Therex: Supine for AAROM to tolerance R shoulder flex, abd, ER/IR, multiple reps, combined with therapist providing scapular stabilization against ribs Prone for scapular retractions, to engage middle traps and rhomboids. Required repeated cues to relax R elbow, shoulder  Sidelying L for assisted R shoulder ER( therapist provided 75% assist) to engage infraspinatus. Seated for shoulder pulley for 3 min for AAROM R shoulder elevation.  Vaso x 10 min R shoulder low compression 34 deg    01/18/23 Therapeutic Exercise: to improve strength and mobility.  Demo, verbal and tactile cues throughout for technique.  Seated shoulder roll x 15 Seated shoulder squeeze x 15 Seated green  pball rolls into flexion bil x 20;  x 20 with R UE Seated ER with wand PROM x 10 Shoulder isometrics seated:  Flexion 5x5" ER 5x5" IR 5x5" Abuction 5x5"  Manual Therapy: to decrease muscle spasm, pain and improve mobility R shld gentle PROM flex,ER/IR  Vaso x 10 min R shoulder low compression 34 deg    PATIENT EDUCATION: Education details: progress with PT, ongoing PT POC, HEP update, and postural awareness  Person educated: Patient Education method: Explanation, Demonstration, Tactile cues, Verbal cues, and Handouts Education comprehension: verbalized understanding, returned demonstration, and needs further education  HOME EXERCISE PROGRAM: Access Code: [email or web address] URL: [email or web address] Date: 04/15/2023 Prepared by: Linton Rump Tyion Boylen  Exercises - Supine Shoulder Flexion Extension AAROM with Dowel  - 2 x daily - 7 x weekly - 2 sets - 10 reps - Supine Shoulder External Rotation in 45 Degrees Abduction AAROM with Dowel  - 2 x daily - 7 x weekly - 2 sets - 10 reps - Standing Shoulder External Rotation with Resistance  - 1 x daily - 7 x weekly - 1 sets - 10 reps - 10 sec hold Access Code: [email or web address] URL:  [email or web address] Date: 03/02/2023 Prepared by: Glenetta Hew  Exercises - Seated Scapular Retraction  - 2 x daily - 7 x weekly - 2 sets - 10 reps - 3-5 seconds hold - Seated Elbow Flexion and Extension AROM  - 2 x daily - 7 x weekly - 2 sets - 10 reps - Seated Shoulder Flexion Towel Slide at Table  - 2 x daily - 7 x weekly - 2 sets - 10 reps - Isometric Shoulder External Rotation  - 1 x daily - 7 x weekly - 3 sets - 5 reps - 5 sec hold - Isometric Shoulder Internal Rotation  - 1 x daily - 7 x weekly - 3 sets - 5 reps - 5 sec hold - Isometric Shoulder Flexion  - 1 x daily - 7 x weekly - 3 sets - 5 reps - 5 sec hold - Isometric Shoulder Abduction  - 1 x daily - 7 x weekly - 3 sets - 5 reps - 5 sec hold - Seated Shoulder External Rotation AAROM with Cane and Hand in Neutral  - 1 x daily - 7 x weekly - 2 sets - 10 reps - Prone Single Arm Shoulder Horizontal Abduction with Scapular Retraction and Palm Down  - 1 x daily - 7 x weekly - 2 sets - 10 reps - 3 sec hold - Seated Shoulder Flexion AAROM with Pulley Behind  - 1 x daily - 7 x weekly - 2 sets - 10 reps - 3 sec hold - Supine Shoulder Flexion AAROM with Dowel  - 1 x daily - 7 x weekly - 2 sets - 10 reps - 3 sec hold - Supine Shoulder Protraction with Dowel  - 1 x daily - 7 x weekly - 2 sets - 10 reps - 3 sec hold - Standing Shoulder Abduction ROM with Dowel  - 1 x daily - 7 x weekly - 2 sets - 10 reps - 3 sec hold - Standing Bilateral Low Shoulder Row with Anchored Resistance  - 1 x daily - 7 x weekly - 2 sets - 10 reps - 5 sec hold - Scapular Retraction with Resistance Advanced  - 1 x daily - 7 x weekly - 2 sets - 10 reps - 5 sec hold  ASSESSMENT:  CLINICAL IMPRESSION:  [place] returns to PT today after 6-week absence  for only 6th visit  She reports continued high levels of pain in R shoulder as weel as stiffness.  Has been attending aquatic PT and trying to stretch in the water.  R shoulder A/PROM remains limited in all  planes with significant muscle guarding and substitution evident particularly with overhead motions and poorly controlled scapular activation.  Responded well to manual inf glides, and repeated stretching.  R shoulder strength still limited especially with elevation and ER.  She has been referred back to PT for additional visits after seeing her surgeon.  Emphasized to her that the shoulder capsular tightness will require repetitive, frequent stretching to change. Extended her plon of care   for skilled PT to address above deficits to improve mobility and activity tolerance with decreased pain interference.   OBJECTIVE IMPAIRMENTS: decreased activity tolerance, decreased endurance, decreased knowledge of use of DME, decreased mobility, decreased ROM, decreased strength, increased edema, increased fascial restrictions, impaired perceived functional ability, increased muscle spasms, impaired flexibility, impaired sensation, impaired UE functional use, improper body mechanics, postural dysfunction, and pain.   ACTIVITY LIMITATIONS: carrying, lifting, bending, sitting, standing, sleeping, bed mobility, bathing, toileting, dressing, reach over head, hygiene/grooming, locomotion level, and caring for others  PARTICIPATION LIMITATIONS: meal prep, cleaning, laundry, driving, shopping, community activity, occupation, and yard work  PERSONAL FACTORS: Past/current experiences and 3+ comorbidities: Radiculopathy, Chronic back pain, Depression, OA of AC joint, L shoulder surgery - SAD/DCR   are also affecting patient's functional outcome.   REHAB POTENTIAL: Good  CLINICAL DECISION MAKING: Stable/uncomplicated  EVALUATION COMPLEXITY: Low   GOALS: Goals reviewed with patient? Yes  SHORT TERM GOALS: Target date: 01/28/2023, extended to 03/30/2023   Pt will be independent with original HEP. Baseline: Goal status: MET  03/02/23  2.  Pt will verbalize a decrease in pain by 25% to increase her QOL and increase  her tolerance for exercise and ROM. Baseline:  Goal status: IN PROGRESS  03/02/23 - patient reports consistent increased levels of pain 04/15/23; rates 7 1/2 /10 which is higher pain level  LONG TERM GOALS: Target date: 03/04/2023, extended to 04/27/2023   Pt will demonstrate independence with ongoing HEP. Baseline:  Goal status: IN PROGRESS  03/02/23 - HEP updated today 6/6/ 24 updated again  2.  Pt will report an improvement in score by 10 points on the QuickDASH to demonstrate an increase in function and decrease in pain.  Baseline: TBD  Goal status: IN PROGRESS 04/15/23: 65.9%  3.  Pt will increase UE strength to grossly >/= 4/5 to address deficits seen above and improve overall function Baseline:  Goal status: IN PROGRESS   6/6/24R shoulder strength grossly 3 to 3+/5   4.  Pt will increase R shoulder ROM to [facility] to address deficits seen above and improve overall function Baseline:  Goal status: IN PROGRESS  03/02/23 - R shoulder A/PROM limited in all planes 04/15/23:  AROM /PROM limited all planes  5.  Patient to demonstrate improved upright posture with posterior shoulder girdle engaged to promote improved glenohumeral joint mobility Baseline:  Goal status: IN PROGRESS  03/02/23 - decreased scapular activation with increased shoulder hiking evident during shoulder elevation 04/15/23: poor mechanics, shrugging evident R shoulder  6.  Pt will verbalize a decrease in pain of 50% to increase her QOL and increase her involvement in her community Baseline:  Goal status: IN PROGRESS  03/02/23 - patient reports consistent increased levels of pain 04/15/23:  pain same rating as last visit 6 weeks ago  PLAN:  PT FREQUENCY: 2x/week  PT DURATION: 6 weeks  PLANNED INTERVENTIONS: Therapeutic exercises, Therapeutic activity, Neuromuscular re-education, Patient/Family education, Self Care, Joint mobilization, DME instructions, Aquatic Therapy, Dry Needling, Electrical stimulation, Cryotherapy, Moist heat,  Taping, Vasopneumatic device, Ultrasound, Manual therapy, and Re-evaluation  PLAN FOR NEXT SESSION: 16 weeks post-op as of 04/15/23  (surgery 12/14/22) - progress with Rom. Strength to tolerance.  Would add prone ex next to isolate scapular mechanics.    Ransome Helwig L Suella Cogar, PT 04/15/2023, 12:32 PM

## 2023-04-20 ENCOUNTER — Encounter: Payer: Medicare HMO | Admitting: Physical Therapy

## 2023-04-22 ENCOUNTER — Ambulatory Visit: Payer: Medicare HMO | Admitting: Physical Therapy

## 2023-04-22 ENCOUNTER — Encounter: Payer: Self-pay | Admitting: Physical Therapy

## 2023-04-22 DIAGNOSIS — R293 Abnormal posture: Secondary | ICD-10-CM

## 2023-04-22 DIAGNOSIS — M25511 Pain in right shoulder: Secondary | ICD-10-CM

## 2023-04-22 DIAGNOSIS — M6281 Muscle weakness (generalized): Secondary | ICD-10-CM

## 2023-04-22 DIAGNOSIS — M62838 Other muscle spasm: Secondary | ICD-10-CM

## 2023-04-22 DIAGNOSIS — M25611 Stiffness of right shoulder, not elsewhere classified: Secondary | ICD-10-CM

## 2023-04-22 NOTE — Therapy (Addendum)
OUTPATIENT PHYSICAL THERAPY TREATMENT     Patient Name: Robin Schmidt MRN: [id number] DOB:04/21/1966, 57 y.o., female Today's Date: 04/22/2023  END OF SESSION:  PT End of Session - 04/22/23 1022     Visit Number 7    Date for PT Re-Evaluation 06/10/23    Authorization Type [employer]    Authorization Time Period  04/15/23 - 06/10/23    Authorization - Visit Number  2   Authorization - Number of Visits  10   Progress Note Due on Visit  16 (Recert/PN on visit #6 - 04/15/23)    PT Start Time 1022   Pt arrived late   PT Stop Time 1112    PT Time Calculation (min) 50 min    Activity Tolerance Patient tolerated treatment well;Patient limited by pain    Behavior During Therapy [facility] for tasks assessed/performed               Past Medical History:  Diagnosis Date   Anxiety    Back pain, chronic    Depression    Past Surgical History:  Procedure Laterality Date   ANTERIOR CERVICAL DECOMP/DISCECTOMY FUSION N/A 11/22/2013   Procedure: ANTERIOR CERVICAL DECOMPRESSION/DISCECTOMY FUSION 1 LEVEL;  Surgeon: Emilee Hero, MD;  Location: [facility] OR;  Service: Orthopedics;  Laterality: N/A;  Anterior cervical decompression fusion, cervical 5-6 with instrumentation and allograft   back fusion     BACK SURGERY     KNEE ARTHROSCOPY     LAPAROSCOPIC GASTRIC SLEEVE RESECTION     TUBAL LIGATION     Patient Active Problem List   Diagnosis Date Noted   Radiculopathy 11/22/2013   Menometrorrhagia 06/08/2012    Class: Chronic    PCP: Dahlia Bailiff, MD  REFERRING PROVIDER: Beverely Low, MD  REFERRING DIAG: [id number] (ICD-10-CM) - Complete rotator cuff tear or rupture of right shoulder, not specified as traumatic   THERAPY DIAG:  Acute pain of right shoulder  Stiffness of right shoulder, not elsewhere classified  Muscle weakness (generalized)  Abnormal posture  Other muscle spasm  RATIONALE FOR EVALUATION AND TREATMENT: Rehabilitation  ONSET DATE: 12/14/22 - R Shoulder Scope/  Mini Open Rotator Cuff Repair    NEXT MD VISIT: 03/05/23   SUBJECTIVE:                                                                                                                                                                                      SUBJECTIVE STATEMENT:  Pt reports she was doing more and her shoulder is hurting as a result.   PAIN:  Are you having pain? Yes: NPRS scale:  8/10 Pain location: R upper/lateral shoulder  Pain description: dull aching, tightness Aggravating factors: movement  Relieving factors: medication, warm shower, ice   PERTINENT HISTORY: Radiculopathy, Chronic back pain, Depression, OA of AC joint, L shoulder surgery - SAD/DCR 08/06/15  PRECAUTIONS: Shoulder 02/09/23: new orders received to continue and progress with behind the back, overhead, gentle PRE's with close elbow.  WEIGHT BEARING RESTRICTIONS: No  FALLS:  Has patient fallen in last 6 months? Yes. Number of falls 1 August fell in the tub due to LOB   LIVING ENVIRONMENT: Lives with: lives with their family Lives in: House/apartment Stairs: Yes: Internal: 14 steps; on right going up and External: 1 steps; none Has following equipment at home: Single point cane-does not have right now but will get it soon  OCCUPATION: Currently not working   PLOF: Independent with basic ADLs  LEISURE ACTIVITIES: walking and exercising before back and shoulder issues   PATIENT GOALS: Get ROM back, decrease overall pain    OBJECTIVE:   DIAGNOSTIC FINDINGS:  11/19/22 R Shoulder MRI: MRI scan reviewed in detail with patient showing a high-grade partial-thickness versus full-thickness supraspinatus rotator cuff tear. No muscular atrophy. There is some degenerative change in the superior labrum with questionable tear. Biceps located in the biceps groove. Subscap okay. Articular cartilage looks normal. AC joint with arthritis and slight outlet impingement.   PATIENT SURVEYS :  QuickDASH: TBD at next  visit   COGNITION: Overall cognitive status: Within functional limits for tasks assessed     SENSATION: N/T down the R arm sometimes, depends on movement, could also be due to wrist injury from 2 years ago  POSTURE: Rounded shoulders, forward head   UPPER EXTREMITY ROM: L WNL   Active ROM Left 03/02/23 Right 03/02/23 R  04/15/23  Shoulder flexion 150 82 96  Shoulder extension 55 32 To L5  Shoulder abduction 145 63 84  Shoulder adduction     Shoulder internal rotation 74 46   Shoulder external rotation 72 39 To T2  Elbow flexion     Elbow extension     Wrist flexion     Wrist extension     Wrist ulnar deviation     Wrist radial deviation     Wrist pronation     Wrist supination      Passive ROM Right eval Right 03/02/23 R 04/15/23  Shoulder flexion 20*  120 118  Shoulder extension     Shoulder abduction  76 112  Shoulder adduction     Shoulder internal rotation 39* 48 25  Shoulder external rotation 18* 45 35  Elbow flexion 70*    Elbow extension 8*    Wrist flexion     Wrist extension     Wrist ulnar deviation     Wrist radial deviation     Wrist pronation 77*    Wrist supination 60*    (Blank rows = not tested)  UPPER EXTREMITY MMT: could not test R due to protocol  MMT Left eval Right R 04/15/23  Shoulder flexion 4 2+ 3  Shoulder extension 5 3- 3  Shoulder abduction 4+ 2 3-  Shoulder adduction     Shoulder internal rotation 5 3 3+  Shoulder external rotation 4+ 2+ 3-  Middle trapezius     Lower trapezius     Elbow flexion 5    Elbow extension 5    Wrist flexion     Wrist extension     Wrist ulnar deviation     Wrist radial deviation  Wrist pronation 5    Wrist supination 5    Grip strength (lbs) 40 lbs     (Blank rows = not tested)  JOINT MOBILITY TESTING:  NT due to protocol    TODAY'S TREATMENT:                                                                                                                                         DATE:    04/22/23 THERAPEUTIC EXERCISE: to improve flexibility, strength and mobility.  Verbal and tactile cues throughout for technique. UBE: L1.0 x 6 min (3' each fwd and back) Prone R scap retraction + shoulder row x 10 Prone R scap retraction + shoulder extension x 10 Prone R scap retraction + shoulder horiz ABD x 10 S/L R scap retraction + shoulder ER 2 x 10 S/L R scap retraction + shoulder ABD 2 x 10 Hooklying R shoulder protraction at 90 flexion 2 x 10 - VC & TC cues for proper movement pattern Hooklying R shoulder CW/CCW circles at 90 flexion 2# x 10 each direction Hooklying B shoulder flexion with 1# bar x 10 - pt reporting painful pinching sensation in right shoulder at end ROM, only mildly reduced with PT providing MWM for scapular motion  MANUAL THERAPY: To promote normalized muscle tension, improved flexibility, improved joint mobility, increased ROM, and reduced pain. STM/DTM to R lateral deltoid MWM for R scapular retraction & depression with supine B shoulder flexion   MODALITIES: Game [employer] vasopneumatic compression post session to R shoulder x 10 min, low compression, 34 deg to reduce post-exercise pain and swelling/edema   04/15/23: Reassessed today for updated plan of care:  Manual: Supine for inferior glides, R humeral head, 60 sec bouts, 3reps to improve capsular mobility Supine AAROM/ Passive stretching R shoulder all planes  Instructed in therex as described in medbridge app below: Shoulder pulley for 3 min at end of session with marked improvement in R shoulder elevation as compared to initial part of this session.   03/02/23 - Recert THERAPEUTIC EXERCISE: to improve flexibility, strength and mobility.  Verbal and tactile cues throughout for technique. Pulleys: Flexion and scaption x 3 min each S/L R scapular retraction and depression 10 x 3" S/L R scapular retraction + PT assisted AAROM shoulder ER 10 x 3" S/L R scapular retraction + PT assisted AAROM shoulder  flexion to 90 10 x 3" - pt having difficulty maintaining scapular engagement Supine wand AAROM R shoulder flexion with PT providing scapular stabilization against ribs x 10 Supine wand AAROM R shoulder protraction x 10 Supine wand AAROM R shoulder scaption/abduction with PT providing scapular stabilization against ribs x 10 Supine wand AAROM R shoulder scaption/abduction with VC & TC for scapular stabilization against ribs x 10, avoiding shoulder hike Standing scapular retraction + YTB shoulder rows 10 x 5" Standing scapular retraction + YTB shoulder extension to neutral 10 x  5"  MANUAL THERAPY: Supine R shoulder - To promote normalized muscle tension, improved flexibility, improved joint mobility, increased ROM, and reduced pain. Gentle GH oscillations to reduce muscle guarding and pain Grade II-III R glenohumeral distraction, inferior and AP glides STM/DTM, XFM and manual TPR to anterior and lateral deltoid  THERAPEUTIC ACTIVITIES: R shoulder ROM assessment Goal assessment    PATIENT EDUCATION: Education details: progress with PT, ongoing PT POC, HEP update, and postural awareness  Person educated: Patient Education method: Explanation, Demonstration, Tactile cues, Verbal cues, and Handouts Education comprehension: verbalized understanding, returned demonstration, and needs further education  HOME EXERCISE PROGRAM: Access Code: [email or web address] URL: [email or web address] Date: 04/15/2023 Prepared by: Linton Rump Speaks  Exercises - Supine Shoulder Flexion Extension AAROM with Dowel  - 2 x daily - 7 x weekly - 2 sets - 10 reps - Supine Shoulder External Rotation in 45 Degrees Abduction AAROM with Dowel  - 2 x daily - 7 x weekly - 2 sets - 10 reps - Standing Shoulder External Rotation with Resistance  - 1 x daily - 7 x weekly - 1 sets - 10 reps - 10 sec hold  Access Code: [email or web address] URL: [email or web address] Date: 03/02/2023 Prepared by: Glenetta Hew  Exercises -  Seated Scapular Retraction  - 2 x daily - 7 x weekly - 2 sets - 10 reps - 3-5 seconds hold - Seated Elbow Flexion and Extension AROM  - 2 x daily - 7 x weekly - 2 sets - 10 reps - Seated Shoulder Flexion Towel Slide at Table  - 2 x daily - 7 x weekly - 2 sets - 10 reps - Isometric Shoulder External Rotation  - 1 x daily - 7 x weekly - 3 sets - 5 reps - 5 sec hold - Isometric Shoulder Internal Rotation  - 1 x daily - 7 x weekly - 3 sets - 5 reps - 5 sec hold - Isometric Shoulder Flexion  - 1 x daily - 7 x weekly - 3 sets - 5 reps - 5 sec hold - Isometric Shoulder Abduction  - 1 x daily - 7 x weekly - 3 sets - 5 reps - 5 sec hold - Seated Shoulder External Rotation AAROM with Cane and Hand in Neutral  - 1 x daily - 7 x weekly - 2 sets - 10 reps - Prone Single Arm Shoulder Horizontal Abduction with Scapular Retraction and Palm Down  - 1 x daily - 7 x weekly - 2 sets - 10 reps - 3 sec hold - Seated Shoulder Flexion AAROM with Pulley Behind  - 1 x daily - 7 x weekly - 2 sets - 10 reps - 3 sec hold - Supine Shoulder Flexion AAROM with Dowel  - 1 x daily - 7 x weekly - 2 sets - 10 reps - 3 sec hold - Supine Shoulder Protraction with Dowel  - 1 x daily - 7 x weekly - 2 sets - 10 reps - 3 sec hold - Standing Shoulder Abduction ROM with Dowel  - 1 x daily - 7 x weekly - 2 sets - 10 reps - 3 sec hold - Standing Bilateral Low Shoulder Row with Anchored Resistance  - 1 x daily - 7 x weekly - 2 sets - 10 reps - 5 sec hold - Scapular Retraction with Resistance Advanced  - 1 x daily - 7 x weekly - 2 sets - 10 reps - 5 sec hold  ASSESSMENT:  CLINICAL IMPRESSION:  Robin Schmidt reports increased soreness from increased  recent activity. Initiated TE with focus on scapular muscle activation/engagement initially in prone, progressing to side-lying and supine. Fatigue noted with most exercises but pain only reported with attempts at supine wand flexion - motion limited by c/o pinching pain in R lateral deltoid with TTP  noted. MT targeting STM/DTM for taut bands in deltoid as well as MWM to promote better scapular coordination. Session concluded with GR vasopneumatic compression to reduce post-exercise soreness and inflammation.  OBJECTIVE IMPAIRMENTS: decreased activity tolerance, decreased endurance, decreased knowledge of use of DME, decreased mobility, decreased ROM, decreased strength, increased edema, increased fascial restrictions, impaired perceived functional ability, increased muscle spasms, impaired flexibility, impaired sensation, impaired UE functional use, improper body mechanics, postural dysfunction, and pain.   ACTIVITY LIMITATIONS: carrying, lifting, bending, sitting, standing, sleeping, bed mobility, bathing, toileting, dressing, reach over head, hygiene/grooming, locomotion level, and caring for others  PARTICIPATION LIMITATIONS: meal prep, cleaning, laundry, driving, shopping, community activity, occupation, and yard work  PERSONAL FACTORS: Past/current experiences and 3+ comorbidities: Radiculopathy, Chronic back pain, Depression, OA of AC joint, L shoulder surgery - SAD/DCR   are also affecting patient's functional outcome.   REHAB POTENTIAL: Good  CLINICAL DECISION MAKING: Stable/uncomplicated  EVALUATION COMPLEXITY: Low   GOALS: Goals reviewed with patient? Yes  SHORT TERM GOALS: Target date: 01/28/2023, extended to 03/30/2023   Pt will be independent with original HEP. Baseline: Goal status: MET  03/02/23  2.  Pt will verbalize a decrease in pain by 25% to increase her QOL and increase her tolerance for exercise and ROM. Baseline:  Goal status: IN PROGRESS  03/02/23 - patient reports consistent increased levels of pain 04/15/23; rates 7 1/2 /10 which is higher pain level  LONG TERM GOALS: Target date: 03/04/2023, extended to 06/10/2023   Pt will demonstrate independence with ongoing HEP. Baseline:  Goal status: IN PROGRESS  03/02/23 - HEP updated today 04/15/23 updated again  2.  Pt  will report an improvement in score by 10 points on the QuickDASH to demonstrate an increase in function and decrease in pain.  Baseline: TBD  Goal status: IN PROGRESS 04/15/23: 65.9%  3.  Pt will increase UE strength to grossly >/= 4/5 to address deficits seen above and improve overall function Baseline:  Goal status: IN PROGRESS   6/6/24R shoulder strength grossly 3 to 3+/5   4.  Pt will increase R shoulder ROM to [facility] to address deficits seen above and improve overall function Baseline:  Goal status: IN PROGRESS  03/02/23 - R shoulder A/PROM limited in all planes 04/15/23:  AROM /PROM limited all planes  5.  Patient to demonstrate improved upright posture with posterior shoulder girdle engaged to promote improved glenohumeral joint mobility Baseline:  Goal status: IN PROGRESS  03/02/23 - decreased scapular activation with increased shoulder hiking evident during shoulder elevation 04/15/23: poor mechanics, shrugging evident R shoulder  6.  Pt will verbalize a decrease in pain of 50% to increase her QOL and increase her involvement in her community Baseline:  Goal status: IN PROGRESS  03/02/23 - patient reports consistent increased levels of pain 04/15/23:  pain same rating as last visit 6 weeks ago   PLAN:  PT FREQUENCY: 2x/week  PT DURATION: 6 weeks  PLANNED INTERVENTIONS: Therapeutic exercises, Therapeutic activity, Neuromuscular re-education, Patient/Family education, Self Care, Joint mobilization, DME instructions, Dry Needling, Electrical stimulation, Cryotherapy, Moist heat, Taping, Vasopneumatic device, Ultrasound, Manual therapy, and Re-evaluation  PLAN FOR NEXT SESSION: 17 weeks post-op as of 04/22/23  (surgery 12/14/22) - progress with  ROM, Strength to tolerance, watching scapular mechanics.    Marry Guan, PT 04/22/2023, 1:19 PM

## 2023-04-27 ENCOUNTER — Ambulatory Visit: Payer: Medicare HMO | Admitting: Physical Therapy

## 2023-05-18 ENCOUNTER — Ambulatory Visit: Payer: Medicare HMO | Attending: Orthopedic Surgery

## 2023-05-18 DIAGNOSIS — M62838 Other muscle spasm: Secondary | ICD-10-CM | POA: Insufficient documentation

## 2023-05-18 DIAGNOSIS — R293 Abnormal posture: Secondary | ICD-10-CM | POA: Insufficient documentation

## 2023-05-18 DIAGNOSIS — M6281 Muscle weakness (generalized): Secondary | ICD-10-CM | POA: Diagnosis present

## 2023-05-18 DIAGNOSIS — M25611 Stiffness of right shoulder, not elsewhere classified: Secondary | ICD-10-CM | POA: Insufficient documentation

## 2023-05-18 DIAGNOSIS — M25511 Pain in right shoulder: Secondary | ICD-10-CM | POA: Insufficient documentation

## 2023-05-18 NOTE — Therapy (Signed)
OUTPATIENT PHYSICAL THERAPY TREATMENT     Patient Name: Robin Schmidt MRN: [id number] DOB:August 03, 1966, 57 y.o., female Today's Date: 05/18/2023  END OF SESSION:  PT End of Session - 05/18/23 1035     Visit Number 8    Date for PT Re-Evaluation 06/10/23    Authorization Type [employer] - [place]: [place]    PT Start Time 1030   pt late   PT Stop Time 1120    PT Time Calculation (min) 50 min    Activity Tolerance Patient tolerated treatment well;Patient limited by pain    Behavior During Therapy [facility] for tasks assessed/performed               Past Medical History:  Diagnosis Date   Anxiety    Back pain, chronic    Depression    Past Surgical History:  Procedure Laterality Date   ANTERIOR CERVICAL DECOMP/DISCECTOMY FUSION N/A 11/22/2013   Procedure: ANTERIOR CERVICAL DECOMPRESSION/DISCECTOMY FUSION 1 LEVEL;  Surgeon: Emilee Hero, MD;  Location: [facility] OR;  Service: Orthopedics;  Laterality: N/A;  Anterior cervical decompression fusion, cervical 5-6 with instrumentation and allograft   back fusion     BACK SURGERY     KNEE ARTHROSCOPY     LAPAROSCOPIC GASTRIC SLEEVE RESECTION     TUBAL LIGATION     Patient Active Problem List   Diagnosis Date Noted   Radiculopathy 11/22/2013   Menometrorrhagia 06/08/2012    Class: Chronic    PCP: Dahlia Bailiff, MD  REFERRING PROVIDER: Beverely Low, MD  REFERRING DIAG: [phone] (ICD-10-CM) - Complete rotator cuff tear or rupture of right shoulder, not specified as traumatic   THERAPY DIAG:  Acute pain of right shoulder  Stiffness of right shoulder, not elsewhere classified  Muscle weakness (generalized)  Abnormal posture  Other muscle spasm  RATIONALE FOR EVALUATION AND TREATMENT: Rehabilitation  ONSET DATE: 12/14/22 - R Shoulder Scope/ Mini Open Rotator Cuff Repair    NEXT MD VISIT: 03/05/23   SUBJECTIVE:                                                                                                                                                                                       SUBJECTIVE STATEMENT:  Pt reports challenges with full ROM of her R shoulder, it hurts to lay on it as well.  PAIN:  Are you having pain? Yes: NPRS scale:  8/10 Pain location: R upper/lateral shoulder  Pain description: dull aching, tightness Aggravating factors: movement  Relieving factors: medication, warm shower, ice   PERTINENT HISTORY: Radiculopathy, Chronic back pain, Depression, OA of AC joint, L shoulder surgery - SAD/DCR 08/06/15  PRECAUTIONS: Shoulder 02/09/23:  new orders received to continue and progress with behind the back, overhead, gentle PRE's with close elbow.  WEIGHT BEARING RESTRICTIONS: No  FALLS:  Has patient fallen in last 6 months? Yes. Number of falls 1 August fell in the tub due to LOB   LIVING ENVIRONMENT: Lives with: lives with their family Lives in: House/apartment Stairs: Yes: Internal: 14 steps; on right going up and External: 1 steps; none Has following equipment at home: Single point cane-does not have right now but will get it soon  OCCUPATION: Currently not working   PLOF: Independent with basic ADLs  LEISURE ACTIVITIES: walking and exercising before back and shoulder issues   PATIENT GOALS: Get ROM back, decrease overall pain    OBJECTIVE:   DIAGNOSTIC FINDINGS:  11/19/22 R Shoulder MRI: MRI scan reviewed in detail with patient showing a high-grade partial-thickness versus full-thickness supraspinatus rotator cuff tear. No muscular atrophy. There is some degenerative change in the superior labrum with questionable tear. Biceps located in the biceps groove. Subscap okay. Articular cartilage looks normal. AC joint with arthritis and slight outlet impingement.   PATIENT SURVEYS :  QuickDASH: TBD at next visit   COGNITION: Overall cognitive status: Within functional limits for tasks assessed     SENSATION: N/T down the R arm sometimes, depends on movement, could also be due  to wrist injury from 2 years ago  POSTURE: Rounded shoulders, forward head   UPPER EXTREMITY ROM: L WNL   Active ROM Left 03/02/23 Right 03/02/23 R  04/15/23 Right supine 05/18/23  Shoulder flexion 150 82 96 131  Shoulder extension 55 32 To L5   Shoulder abduction 145 63 84 125- pain  Shoulder adduction      Shoulder internal rotation 74 46    Shoulder external rotation 72 39 To T2 40  Elbow flexion      Elbow extension      Wrist flexion      Wrist extension      Wrist ulnar deviation      Wrist radial deviation      Wrist pronation      Wrist supination       Passive ROM Right eval Right 03/02/23 R 04/15/23  Shoulder flexion 20*  120 118  Shoulder extension     Shoulder abduction  76 112  Shoulder adduction     Shoulder internal rotation 39* 48 25  Shoulder external rotation 18* 45 35  Elbow flexion 70*    Elbow extension 8*    Wrist flexion     Wrist extension     Wrist ulnar deviation     Wrist radial deviation     Wrist pronation 77*    Wrist supination 60*    (Blank rows = not tested)  UPPER EXTREMITY MMT: could not test R due to protocol  MMT Left eval Right R 04/15/23  Shoulder flexion 4 2+ 3  Shoulder extension 5 3- 3  Shoulder abduction 4+ 2 3-  Shoulder adduction     Shoulder internal rotation 5 3 3+  Shoulder external rotation 4+ 2+ 3-  Middle trapezius     Lower trapezius     Elbow flexion 5    Elbow extension 5    Wrist flexion     Wrist extension     Wrist ulnar deviation     Wrist radial deviation     Wrist pronation 5    Wrist supination 5    Grip strength (lbs) 40 lbs     (  Blank rows = not tested)  JOINT MOBILITY TESTING:  NT due to protocol    TODAY'S TREATMENT:                                                                                                                                         DATE:  05/18/23 THERAPEUTIC EXERCISE: to improve flexibility, strength and mobility.  Verbal and tactile cues throughout for technique. UBE:  L1.0 x 6 min (3' each fwd and back) Measured R shoulder AROM in supine Supine R shoulder flexion with 2# DB 2x10 Supine R serratus punch with 2# DB x 20 Wall wash R shld flexion, then scaption x 10 Standing rows RTB x 10  Standing shoulder extension RTB x 10  Standing R shoulder IR/ER with RTB x 10 each   04/22/23 THERAPEUTIC EXERCISE: to improve flexibility, strength and mobility.  Verbal and tactile cues throughout for technique. UBE: L1.0 x 6 min (3' each fwd and back) Prone R scap retraction + shoulder row x 10 Prone R scap retraction + shoulder extension x 10 Prone R scap retraction + shoulder horiz ABD x 10 S/L R scap retraction + shoulder ER 2 x 10 S/L R scap retraction + shoulder ABD 2 x 10 Hooklying R shoulder protraction at 90 flexion 2 x 10 - VC & TC cues for proper movement pattern Hooklying R shoulder CW/CCW circles at 90 flexion 2# x 10 each direction Hooklying B shoulder flexion with 1# bar x 10 - pt reporting painful pinching sensation in right shoulder at end ROM, only mildly reduced with PT providing MWM for scapular motion  MANUAL THERAPY: To promote normalized muscle tension, improved flexibility, improved joint mobility, increased ROM, and reduced pain. STM/DTM to R lateral deltoid MWM for R scapular retraction & depression with supine B shoulder flexion   MODALITIES: Game Ready vasopneumatic compression post session to R shoulder x 10 min, low compression, 34 deg to reduce post-exercise pain and swelling/edema   04/15/23: Reassessed today for updated plan of care:  Manual: Supine for inferior glides, R humeral head, 60 sec bouts, 3reps to improve capsular mobility Supine AAROM/ Passive stretching R shoulder all planes  Instructed in therex as described in medbridge app below: Shoulder pulley for 3 min at end of session with marked improvement in R shoulder elevation as compared to initial part of this session.   03/02/23 - Recert THERAPEUTIC EXERCISE: to  improve flexibility, strength and mobility.  Verbal and tactile cues throughout for technique. Pulleys: Flexion and scaption x 3 min each S/L R scapular retraction and depression 10 x 3" S/L R scapular retraction + PT assisted AAROM shoulder ER 10 x 3" S/L R scapular retraction + PT assisted AAROM shoulder flexion to 90 10 x 3" - pt having difficulty maintaining scapular engagement Supine wand AAROM R shoulder flexion with PT providing scapular stabilization against ribs x 10 Supine wand AAROM  R shoulder protraction x 10 Supine wand AAROM R shoulder scaption/abduction with PT providing scapular stabilization against ribs x 10 Supine wand AAROM R shoulder scaption/abduction with VC & TC for scapular stabilization against ribs x 10, avoiding shoulder hike Standing scapular retraction + YTB shoulder rows 10 x 5" Standing scapular retraction + YTB shoulder extension to neutral 10 x 5"  MANUAL THERAPY: Supine R shoulder - To promote normalized muscle tension, improved flexibility, improved joint mobility, increased ROM, and reduced pain. Gentle GH oscillations to reduce muscle guarding and pain Grade II-III R glenohumeral distraction, inferior and AP glides STM/DTM, XFM and manual TPR to anterior and lateral deltoid  THERAPEUTIC ACTIVITIES: R shoulder ROM assessment Goal assessment    PATIENT EDUCATION: Education details: progress with PT, ongoing PT POC, HEP update, and postural awareness  Person educated: Patient Education method: Explanation, Demonstration, Tactile cues, Verbal cues, and Handouts Education comprehension: verbalized understanding, returned demonstration, and needs further education  HOME EXERCISE PROGRAM: Access Code: [email or web address] URL: [email or web address] Date: 04/15/2023 Prepared by: Linton Rump Speaks  Exercises - Supine Shoulder Flexion Extension AAROM with Dowel  - 2 x daily - 7 x weekly - 2 sets - 10 reps - Supine Shoulder External Rotation in 45 Degrees  Abduction AAROM with Dowel  - 2 x daily - 7 x weekly - 2 sets - 10 reps - Standing Shoulder External Rotation with Resistance  - 1 x daily - 7 x weekly - 1 sets - 10 reps - 10 sec hold  Access Code: [email or web address] URL: [email or web address] Date: 05/18/2023 Prepared by: Verta Ellen  Exercises - Seated Scapular Retraction  - 2 x daily - 7 x weekly - 2 sets - 10 reps - 3-5 seconds hold - Seated Elbow Flexion and Extension AROM  - 2 x daily - 7 x weekly - 2 sets - 10 reps - Seated Shoulder Flexion Towel Slide at Table  - 2 x daily - 7 x weekly - 2 sets - 10 reps - Isometric Shoulder External Rotation  - 1 x daily - 7 x weekly - 3 sets - 5 reps - 5 sec hold - Isometric Shoulder Internal Rotation  - 1 x daily - 7 x weekly - 3 sets - 5 reps - 5 sec hold - Isometric Shoulder Flexion  - 1 x daily - 7 x weekly - 3 sets - 5 reps - 5 sec hold - Isometric Shoulder Abduction  - 1 x daily - 7 x weekly - 3 sets - 5 reps - 5 sec hold - Seated Shoulder External Rotation AAROM with Cane and Hand in Neutral  - 1 x daily - 7 x weekly - 2 sets - 10 reps - Prone Single Arm Shoulder Horizontal Abduction with Scapular Retraction and Palm Down  - 1 x daily - 7 x weekly - 2 sets - 10 reps - 3 sec hold - Seated Shoulder Flexion AAROM with Pulley Behind  - 1 x daily - 7 x weekly - 2 sets - 10 reps - 3 sec hold - Supine Shoulder Flexion AAROM with Dowel  - 1 x daily - 7 x weekly - 2 sets - 10 reps - 3 sec hold - Supine Shoulder Protraction with Dowel  - 1 x daily - 7 x weekly - 2 sets - 10 reps - 3 sec hold - Standing Shoulder Abduction ROM with Dowel  - 1 x daily - 7 x weekly - 2 sets - 10 reps - 3 sec hold - Standing Bilateral Low  Shoulder Row with Anchored Resistance  - 1 x daily - 7 x weekly - 2 sets - 10 reps - 5 sec hold - Scapular Retraction with Resistance Advanced  - 1 x daily - 7 x weekly - 2 sets - 10 reps - 5 sec hold - Shoulder External Rotation with Anchored Resistance  - 1 x daily - 7 x weekly -  2 sets - 10 reps - Shoulder Internal Rotation with Resistance  - 1 x daily - 7 x weekly - 2 sets - 10 reps - Supine Shoulder Flexion Extension Full Range AROM  - 1 x daily - 7 x weekly - 2 sets - 10 reps  ASSESSMENT:  CLINICAL IMPRESSION:  Pt continues to report high pain levels in R shoulder. AROM was measured today with her showing good ROM in supine but difficulty with raising her R arm in upright positions w/o assistance. Progressed scap stability and RTC strengthening within tolerance. Cuing required to keep elbow in with ER and to keep 90 deg at elbow. Instruction given throughout session to avoid pain with exercise vs muscle burning associated with muscle fatigue.  OBJECTIVE IMPAIRMENTS: decreased activity tolerance, decreased endurance, decreased knowledge of use of DME, decreased mobility, decreased ROM, decreased strength, increased edema, increased fascial restrictions, impaired perceived functional ability, increased muscle spasms, impaired flexibility, impaired sensation, impaired UE functional use, improper body mechanics, postural dysfunction, and pain.   ACTIVITY LIMITATIONS: carrying, lifting, bending, sitting, standing, sleeping, bed mobility, bathing, toileting, dressing, reach over head, hygiene/grooming, locomotion level, and caring for others  PARTICIPATION LIMITATIONS: meal prep, cleaning, laundry, driving, shopping, community activity, occupation, and yard work  PERSONAL FACTORS: Past/current experiences and 3+ comorbidities: Radiculopathy, Chronic back pain, Depression, OA of AC joint, L shoulder surgery - SAD/DCR   are also affecting patient's functional outcome.   REHAB POTENTIAL: Good  CLINICAL DECISION MAKING: Stable/uncomplicated  EVALUATION COMPLEXITY: Low   GOALS: Goals reviewed with patient? Yes  SHORT TERM GOALS: Target date: 01/28/2023, extended to 03/30/2023   Pt will be independent with original HEP. Baseline: Goal status: MET  03/02/23  2.  Pt will  verbalize a decrease in pain by 25% to increase her QOL and increase her tolerance for exercise and ROM. Baseline:  Goal status: IN PROGRESS  03/02/23 - patient reports consistent increased levels of pain 04/15/23; rates 7 1/2 /10 which is higher pain level  LONG TERM GOALS: Target date: 03/04/2023, extended to 06/10/2023   Pt will demonstrate independence with ongoing HEP. Baseline:  Goal status: IN PROGRESS  03/02/23 - HEP updated today 04/15/23 updated again  2.  Pt will report an improvement in score by 10 points on the QuickDASH to demonstrate an increase in function and decrease in pain.  Baseline: TBD  Goal status: IN PROGRESS 04/15/23: 65.9%  3.  Pt will increase UE strength to grossly >/= 4/5 to address deficits seen above and improve overall function Baseline:  Goal status: IN PROGRESS   6/6/24R shoulder strength grossly 3 to 3+/5   4.  Pt will increase R shoulder ROM to [facility] to address deficits seen above and improve overall function Baseline:  Goal status: IN PROGRESS  05/18/23  5.  Patient to demonstrate improved upright posture with posterior shoulder girdle engaged to promote improved glenohumeral joint mobility Baseline:  Goal status: IN PROGRESS  03/02/23 - decreased scapular activation with increased shoulder hiking evident during shoulder elevation 04/15/23: poor mechanics, shrugging evident R shoulder  6.  Pt will verbalize a decrease in  pain of 50% to increase her QOL and increase her involvement in her community Baseline:  Goal status: IN PROGRESS  03/02/23 - patient reports consistent increased levels of pain 04/15/23:  pain same rating as last visit 6 weeks ago   PLAN:  PT FREQUENCY: 2x/week  PT DURATION: 6 weeks  PLANNED INTERVENTIONS: Therapeutic exercises, Therapeutic activity, Neuromuscular re-education, Patient/Family education, Self Care, Joint mobilization, DME instructions, Dry Needling, Electrical stimulation, Cryotherapy, Moist heat, Taping, Vasopneumatic device,  Ultrasound, Manual therapy, and Re-evaluation  PLAN FOR NEXT SESSION: 17 weeks post-op as of 04/22/23  (surgery 12/14/22) - progress with ROM, Strength to tolerance, watching scapular mechanics.    Darleene Cleaver, PTA 05/18/2023, 11:37 AM

## 2023-05-25 ENCOUNTER — Ambulatory Visit: Payer: Medicare HMO

## 2023-05-25 NOTE — Therapy (Deleted)
OUTPATIENT PHYSICAL THERAPY TREATMENT     Patient Name: Robin Schmidt MRN: [id number] DOB:11/25/1965, 57 y.o., female Today's Date: 05/25/2023  END OF SESSION:      Past Medical History:  Diagnosis Date   Anxiety    Back pain, chronic    Depression    Past Surgical History:  Procedure Laterality Date   ANTERIOR CERVICAL DECOMP/DISCECTOMY FUSION N/A 11/22/2013   Procedure: ANTERIOR CERVICAL DECOMPRESSION/DISCECTOMY FUSION 1 LEVEL;  Surgeon: Emilee Hero, MD;  Location: [facility] OR;  Service: Orthopedics;  Laterality: N/A;  Anterior cervical decompression fusion, cervical 5-6 with instrumentation and allograft   back fusion     BACK SURGERY     KNEE ARTHROSCOPY     LAPAROSCOPIC GASTRIC SLEEVE RESECTION     TUBAL LIGATION     Patient Active Problem List   Diagnosis Date Noted   Radiculopathy 11/22/2013   Menometrorrhagia 06/08/2012    Class: Chronic    PCP: Dahlia Bailiff, MD  REFERRING PROVIDER: Beverely Low, MD  REFERRING DIAG: [phone] (ICD-10-CM) - Complete rotator cuff tear or rupture of right shoulder, not specified as traumatic   THERAPY DIAG:  No diagnosis found.  RATIONALE FOR EVALUATION AND TREATMENT: Rehabilitation  ONSET DATE: 12/14/22 - R Shoulder Scope/ Mini Open Rotator Cuff Repair    NEXT MD VISIT: 03/05/23   SUBJECTIVE:                                                                                                                                                                                      SUBJECTIVE STATEMENT:  Pt reports challenges with full ROM of her R shoulder, it hurts to lay on it as well.  PAIN:  Are you having pain? Yes: NPRS scale:  8/10 Pain location: R upper/lateral shoulder  Pain description: dull aching, tightness Aggravating factors: movement  Relieving factors: medication, warm shower, ice   PERTINENT HISTORY: Radiculopathy, Chronic back pain, Depression, OA of AC joint, L shoulder surgery - SAD/DCR  08/06/15  PRECAUTIONS: Shoulder 02/09/23: new orders received to continue and progress with behind the back, overhead, gentle PRE's with close elbow.  WEIGHT BEARING RESTRICTIONS: No  FALLS:  Has patient fallen in last 6 months? Yes. Number of falls 1 August fell in the tub due to LOB   LIVING ENVIRONMENT: Lives with: lives with their family Lives in: House/apartment Stairs: Yes: Internal: 14 steps; on right going up and External: 1 steps; none Has following equipment at home: Single point cane-does not have right now but will get it soon  OCCUPATION: Currently not working   PLOF: Independent with basic ADLs  LEISURE ACTIVITIES: walking and exercising before back and shoulder  issues   PATIENT GOALS: Get ROM back, decrease overall pain    OBJECTIVE:   DIAGNOSTIC FINDINGS:  11/19/22 R Shoulder MRI: MRI scan reviewed in detail with patient showing a high-grade partial-thickness versus full-thickness supraspinatus rotator cuff tear. No muscular atrophy. There is some degenerative change in the superior labrum with questionable tear. Biceps located in the biceps groove. Subscap okay. Articular cartilage looks normal. AC joint with arthritis and slight outlet impingement.   PATIENT SURVEYS :  QuickDASH: TBD at next visit   COGNITION: Overall cognitive status: Within functional limits for tasks assessed     SENSATION: N/T down the R arm sometimes, depends on movement, could also be due to wrist injury from 2 years ago  POSTURE: Rounded shoulders, forward head   UPPER EXTREMITY ROM: L WNL   Active ROM Left 03/02/23 Right 03/02/23 R  04/15/23 Right supine 05/18/23  Shoulder flexion 150 82 96 131  Shoulder extension 55 32 To L5   Shoulder abduction 145 63 84 125- pain  Shoulder adduction      Shoulder internal rotation 74 46    Shoulder external rotation 72 39 To T2 40  Elbow flexion      Elbow extension      Wrist flexion      Wrist extension      Wrist ulnar deviation       Wrist radial deviation      Wrist pronation      Wrist supination       Passive ROM Right eval Right 03/02/23 R 04/15/23  Shoulder flexion 20*  120 118  Shoulder extension     Shoulder abduction  76 112  Shoulder adduction     Shoulder internal rotation 39* 48 25  Shoulder external rotation 18* 45 35  Elbow flexion 70*    Elbow extension 8*    Wrist flexion     Wrist extension     Wrist ulnar deviation     Wrist radial deviation     Wrist pronation 77*    Wrist supination 60*    (Blank rows = not tested)  UPPER EXTREMITY MMT: could not test R due to protocol  MMT Left eval Right R 04/15/23  Shoulder flexion 4 2+ 3  Shoulder extension 5 3- 3  Shoulder abduction 4+ 2 3-  Shoulder adduction     Shoulder internal rotation 5 3 3+  Shoulder external rotation 4+ 2+ 3-  Middle trapezius     Lower trapezius     Elbow flexion 5    Elbow extension 5    Wrist flexion     Wrist extension     Wrist ulnar deviation     Wrist radial deviation     Wrist pronation 5    Wrist supination 5    Grip strength (lbs) 40 lbs     (Blank rows = not tested)  JOINT MOBILITY TESTING:  NT due to protocol    TODAY'S TREATMENT:  DATE:  05/18/23 THERAPEUTIC EXERCISE: to improve flexibility, strength and mobility.  Verbal and tactile cues throughout for technique. UBE: L1.0 x 6 min (3' each fwd and back) Measured R shoulder AROM in supine Supine R shoulder flexion with 2# DB 2x10 Supine R serratus punch with 2# DB x 20 Wall wash R shld flexion, then scaption x 10 Standing rows RTB x 10  Standing shoulder extension RTB x 10  Standing R shoulder IR/ER with RTB x 10 each   04/22/23 THERAPEUTIC EXERCISE: to improve flexibility, strength and mobility.  Verbal and tactile cues throughout for technique. UBE: L1.0 x 6 min (3' each fwd and back) Prone R scap  retraction + shoulder row x 10 Prone R scap retraction + shoulder extension x 10 Prone R scap retraction + shoulder horiz ABD x 10 S/L R scap retraction + shoulder ER 2 x 10 S/L R scap retraction + shoulder ABD 2 x 10 Hooklying R shoulder protraction at 90 flexion 2 x 10 - VC & TC cues for proper movement pattern Hooklying R shoulder CW/CCW circles at 90 flexion 2# x 10 each direction Hooklying B shoulder flexion with 1# bar x 10 - pt reporting painful pinching sensation in right shoulder at end ROM, only mildly reduced with PT providing MWM for scapular motion  MANUAL THERAPY: To promote normalized muscle tension, improved flexibility, improved joint mobility, increased ROM, and reduced pain. STM/DTM to R lateral deltoid MWM for R scapular retraction & depression with supine B shoulder flexion   MODALITIES: Game Ready vasopneumatic compression post session to R shoulder x 10 min, low compression, 34 deg to reduce post-exercise pain and swelling/edema   04/15/23: Reassessed today for updated plan of care:  Manual: Supine for inferior glides, R humeral head, 60 sec bouts, 3reps to improve capsular mobility Supine AAROM/ Passive stretching R shoulder all planes  Instructed in therex as described in medbridge app below: Shoulder pulley for 3 min at end of session with marked improvement in R shoulder elevation as compared to initial part of this session.   03/02/23 - Recert THERAPEUTIC EXERCISE: to improve flexibility, strength and mobility.  Verbal and tactile cues throughout for technique. Pulleys: Flexion and scaption x 3 min each S/L R scapular retraction and depression 10 x 3" S/L R scapular retraction + PT assisted AAROM shoulder ER 10 x 3" S/L R scapular retraction + PT assisted AAROM shoulder flexion to 90 10 x 3" - pt having difficulty maintaining scapular engagement Supine wand AAROM R shoulder flexion with PT providing scapular stabilization against ribs x 10 Supine wand  AAROM R shoulder protraction x 10 Supine wand AAROM R shoulder scaption/abduction with PT providing scapular stabilization against ribs x 10 Supine wand AAROM R shoulder scaption/abduction with VC & TC for scapular stabilization against ribs x 10, avoiding shoulder hike Standing scapular retraction + YTB shoulder rows 10 x 5" Standing scapular retraction + YTB shoulder extension to neutral 10 x 5"  MANUAL THERAPY: Supine R shoulder - To promote normalized muscle tension, improved flexibility, improved joint mobility, increased ROM, and reduced pain. Gentle GH oscillations to reduce muscle guarding and pain Grade II-III R glenohumeral distraction, inferior and AP glides STM/DTM, XFM and manual TPR to anterior and lateral deltoid  THERAPEUTIC ACTIVITIES: R shoulder ROM assessment Goal assessment    PATIENT EDUCATION: Education details: progress with PT, ongoing PT POC, HEP update, and postural awareness  Person educated: Patient Education method: Explanation, Demonstration, Tactile cues, Verbal cues, and Handouts Education comprehension:  verbalized understanding, returned demonstration, and needs further education  HOME EXERCISE PROGRAM: Access Code: [email or web address] URL: [email or web address] Date: 04/15/2023 Prepared by: Linton Rump Samyia Motter  Exercises - Supine Shoulder Flexion Extension AAROM with Dowel  - 2 x daily - 7 x weekly - 2 sets - 10 reps - Supine Shoulder External Rotation in 45 Degrees Abduction AAROM with Dowel  - 2 x daily - 7 x weekly - 2 sets - 10 reps - Standing Shoulder External Rotation with Resistance  - 1 x daily - 7 x weekly - 1 sets - 10 reps - 10 sec hold  Access Code: [email or web address] URL: [email or web address] Date: 05/18/2023 Prepared by: Verta Ellen  Exercises - Seated Scapular Retraction  - 2 x daily - 7 x weekly - 2 sets - 10 reps - 3-5 seconds hold - Seated Elbow Flexion and Extension AROM  - 2 x daily - 7 x weekly - 2 sets - 10 reps -  Seated Shoulder Flexion Towel Slide at Table  - 2 x daily - 7 x weekly - 2 sets - 10 reps - Isometric Shoulder External Rotation  - 1 x daily - 7 x weekly - 3 sets - 5 reps - 5 sec hold - Isometric Shoulder Internal Rotation  - 1 x daily - 7 x weekly - 3 sets - 5 reps - 5 sec hold - Isometric Shoulder Flexion  - 1 x daily - 7 x weekly - 3 sets - 5 reps - 5 sec hold - Isometric Shoulder Abduction  - 1 x daily - 7 x weekly - 3 sets - 5 reps - 5 sec hold - Seated Shoulder External Rotation AAROM with Cane and Hand in Neutral  - 1 x daily - 7 x weekly - 2 sets - 10 reps - Prone Single Arm Shoulder Horizontal Abduction with Scapular Retraction and Palm Down  - 1 x daily - 7 x weekly - 2 sets - 10 reps - 3 sec hold - Seated Shoulder Flexion AAROM with Pulley Behind  - 1 x daily - 7 x weekly - 2 sets - 10 reps - 3 sec hold - Supine Shoulder Flexion AAROM with Dowel  - 1 x daily - 7 x weekly - 2 sets - 10 reps - 3 sec hold - Supine Shoulder Protraction with Dowel  - 1 x daily - 7 x weekly - 2 sets - 10 reps - 3 sec hold - Standing Shoulder Abduction ROM with Dowel  - 1 x daily - 7 x weekly - 2 sets - 10 reps - 3 sec hold - Standing Bilateral Low Shoulder Row with Anchored Resistance  - 1 x daily - 7 x weekly - 2 sets - 10 reps - 5 sec hold - Scapular Retraction with Resistance Advanced  - 1 x daily - 7 x weekly - 2 sets - 10 reps - 5 sec hold - Shoulder External Rotation with Anchored Resistance  - 1 x daily - 7 x weekly - 2 sets - 10 reps - Shoulder Internal Rotation with Resistance  - 1 x daily - 7 x weekly - 2 sets - 10 reps - Supine Shoulder Flexion Extension Full Range AROM  - 1 x daily - 7 x weekly - 2 sets - 10 reps  ASSESSMENT:  CLINICAL IMPRESSION:  Pt continues to report high pain levels in R shoulder. AROM was measured today with her showing good ROM in supine but difficulty with raising her R arm in upright positions w/o assistance. Progressed  scap stability and RTC strengthening within  tolerance. Cuing required to keep elbow in with ER and to keep 90 deg at elbow. Instruction given throughout session to avoid pain with exercise vs muscle burning associated with muscle fatigue.  OBJECTIVE IMPAIRMENTS: decreased activity tolerance, decreased endurance, decreased knowledge of use of DME, decreased mobility, decreased ROM, decreased strength, increased edema, increased fascial restrictions, impaired perceived functional ability, increased muscle spasms, impaired flexibility, impaired sensation, impaired UE functional use, improper body mechanics, postural dysfunction, and pain.   ACTIVITY LIMITATIONS: carrying, lifting, bending, sitting, standing, sleeping, bed mobility, bathing, toileting, dressing, reach over head, hygiene/grooming, locomotion level, and caring for others  PARTICIPATION LIMITATIONS: meal prep, cleaning, laundry, driving, shopping, community activity, occupation, and yard work  PERSONAL FACTORS: Past/current experiences and 3+ comorbidities: Radiculopathy, Chronic back pain, Depression, OA of AC joint, L shoulder surgery - SAD/DCR   are also affecting patient's functional outcome.   REHAB POTENTIAL: Good  CLINICAL DECISION MAKING: Stable/uncomplicated  EVALUATION COMPLEXITY: Low   GOALS: Goals reviewed with patient? Yes  SHORT TERM GOALS: Target date: 01/28/2023, extended to 03/30/2023   Pt will be independent with original HEP. Baseline: Goal status: MET  03/02/23  2.  Pt will verbalize a decrease in pain by 25% to increase her QOL and increase her tolerance for exercise and ROM. Baseline:  Goal status: IN PROGRESS  03/02/23 - patient reports consistent increased levels of pain 04/15/23; rates 7 1/2 /10 which is higher pain level  LONG TERM GOALS: Target date: 03/04/2023, extended to 06/10/2023   Pt will demonstrate independence with ongoing HEP. Baseline:  Goal status: IN PROGRESS  03/02/23 - HEP updated today 04/15/23 updated again  2.  Pt will report an  improvement in score by 10 points on the QuickDASH to demonstrate an increase in function and decrease in pain.  Baseline: TBD  Goal status: IN PROGRESS 04/15/23: 65.9%  3.  Pt will increase UE strength to grossly >/= 4/5 to address deficits seen above and improve overall function Baseline:  Goal status: IN PROGRESS   6/6/24R shoulder strength grossly 3 to 3+/5   4.  Pt will increase R shoulder ROM to [facility] to address deficits seen above and improve overall function Baseline:  Goal status: IN PROGRESS  05/18/23  5.  Patient to demonstrate improved upright posture with posterior shoulder girdle engaged to promote improved glenohumeral joint mobility Baseline:  Goal status: IN PROGRESS  03/02/23 - decreased scapular activation with increased shoulder hiking evident during shoulder elevation 04/15/23: poor mechanics, shrugging evident R shoulder  6.  Pt will verbalize a decrease in pain of 50% to increase her QOL and increase her involvement in her community Baseline:  Goal status: IN PROGRESS  03/02/23 - patient reports consistent increased levels of pain 04/15/23:  pain same rating as last visit 6 weeks ago   PLAN:  PT FREQUENCY: 2x/week  PT DURATION: 6 weeks  PLANNED INTERVENTIONS: Therapeutic exercises, Therapeutic activity, Neuromuscular re-education, Patient/Family education, Self Care, Joint mobilization, DME instructions, Dry Needling, Electrical stimulation, Cryotherapy, Moist heat, Taping, Vasopneumatic device, Ultrasound, Manual therapy, and Re-evaluation  PLAN FOR NEXT SESSION: 17 weeks post-op as of 04/22/23  (surgery 12/14/22) - progress with ROM, Strength to tolerance, watching scapular mechanics.    Everard Interrante L Rozena Fierro, PT 05/25/2023, 10:25 AM

## 2023-06-01 ENCOUNTER — Ambulatory Visit: Payer: Medicare HMO | Admitting: Physical Therapy

## 2023-06-01 ENCOUNTER — Encounter: Payer: Self-pay | Admitting: Physical Therapy

## 2023-06-01 DIAGNOSIS — R293 Abnormal posture: Secondary | ICD-10-CM

## 2023-06-01 DIAGNOSIS — M62838 Other muscle spasm: Secondary | ICD-10-CM

## 2023-06-01 DIAGNOSIS — M6281 Muscle weakness (generalized): Secondary | ICD-10-CM

## 2023-06-01 DIAGNOSIS — M25611 Stiffness of right shoulder, not elsewhere classified: Secondary | ICD-10-CM

## 2023-06-01 DIAGNOSIS — M25511 Pain in right shoulder: Secondary | ICD-10-CM | POA: Diagnosis not present

## 2023-06-01 NOTE — Therapy (Signed)
OUTPATIENT PHYSICAL THERAPY TREATMENT     Patient Name: Robin Schmidt MRN: [id number] DOB:1966-10-17, 57 y.o., female Today's Date: 06/01/2023  END OF SESSION:  PT End of Session - 06/01/23 1031     Visit Number 9    Date for PT Re-Evaluation 06/10/23    Authorization Type [employer]    Authorization Time Period 04/15/23 - 06/10/23    Authorization - Visit Number 4    Authorization - Number of Visits 10    Progress Note Due on Visit 16   Recert/PN on visit #6 - 04/15/23   PT Start Time 1031   Pt arrived 15 min late   PT Stop Time 1110    PT Time Calculation (min) 39 min    Activity Tolerance Patient tolerated treatment well    Behavior During Therapy [facility] for tasks assessed/performed                Past Medical History:  Diagnosis Date   Anxiety    Back pain, chronic    Depression    Past Surgical History:  Procedure Laterality Date   ANTERIOR CERVICAL DECOMP/DISCECTOMY FUSION N/A 11/22/2013   Procedure: ANTERIOR CERVICAL DECOMPRESSION/DISCECTOMY FUSION 1 LEVEL;  Surgeon: Emilee Hero, MD;  Location: [facility] OR;  Service: Orthopedics;  Laterality: N/A;  Anterior cervical decompression fusion, cervical 5-6 with instrumentation and allograft   back fusion     BACK SURGERY     KNEE ARTHROSCOPY     LAPAROSCOPIC GASTRIC SLEEVE RESECTION     TUBAL LIGATION     Patient Active Problem List   Diagnosis Date Noted   Radiculopathy 11/22/2013   Menometrorrhagia 06/08/2012    Class: Chronic    PCP: Dahlia Bailiff, MD  REFERRING PROVIDER: Beverely Low, MD  REFERRING DIAG: [phone] (ICD-10-CM) - Complete rotator cuff tear or rupture of right shoulder, not specified as traumatic   THERAPY DIAG:  Acute pain of right shoulder  Stiffness of right shoulder, not elsewhere classified  Muscle weakness (generalized)  Abnormal posture  Other muscle spasm  RATIONALE FOR EVALUATION AND TREATMENT: Rehabilitation  ONSET DATE:  12/14/22 - R Shoulder Scope/ Mini Open  Rotator Cuff Repair    NEXT MD VISIT:  06/08/23 with Dr. Ranell Patrick; 06/02/23 with pain management   SUBJECTIVE:                                                                                                                                                                                      SUBJECTIVE STATEMENT:  Pt reports her ROM seems to be improving but still having a sharp pain in the anterolateral R shoulder. She will meet with her pain  management MD tomorrow, has an MRI scheduled for 06/06/23 and will f/u with Dr. Ranell Patrick on 06/08/23.  PAIN:  Are you having pain? Yes: NPRS scale:  8/10 Pain location: R upper/lateral shoulder  Pain description: sharp Aggravating factors: movement  Relieving factors: medication, warm shower, ice   PERTINENT HISTORY: Radiculopathy, Chronic back pain, Depression, OA of AC joint, L shoulder surgery - SAD/DCR 08/06/15  PRECAUTIONS: Shoulder 02/09/23: new orders received to continue and progress with behind the back, overhead, gentle PRE's with close elbow.  WEIGHT BEARING RESTRICTIONS: No  FALLS:  Has patient fallen in last 6 months? Yes. Number of falls 1 August fell in the tub due to LOB   LIVING ENVIRONMENT: Lives with: lives with their family Lives in: House/apartment Stairs: Yes: Internal: 14 steps; on right going up and External: 1 steps; none Has following equipment at home: Single point cane-does not have right now but will get it soon  OCCUPATION: Currently not working   PLOF: Independent with basic ADLs  LEISURE ACTIVITIES: walking and exercising before back and shoulder issues   PATIENT GOALS: Get ROM back, decrease overall pain    OBJECTIVE:   DIAGNOSTIC FINDINGS:  06/06/23 - R shoulder MRI scheduled  11/19/22 - R Shoulder MRI: MRI scan reviewed in detail with patient showing a high-grade partial-thickness versus full-thickness supraspinatus rotator cuff tear. No muscular atrophy. There is some degenerative change in the superior  labrum with questionable tear. Biceps located in the biceps groove. Subscap okay. Articular cartilage looks normal. AC joint with arthritis and slight outlet impingement.   PATIENT SURVEYS :  QuickDASH: 65.9% (04/15/23)  COGNITION: Overall cognitive status: Within functional limits for tasks assessed     SENSATION: N/T down the R arm sometimes, depends on movement, could also be due to wrist injury from 2 years ago  POSTURE: Rounded shoulders, forward head   UPPER EXTREMITY ROM: L WNL   Active ROM Left 03/02/23 Right 03/02/23 R  04/15/23 Right supine 05/18/23  Shoulder flexion 150 82 96 131  Shoulder extension 55 32    Shoulder abduction 145 63 84 125- pain  Shoulder adduction      Shoulder internal rotation 74 46 To L5   Shoulder external rotation 72 39 To T2 40  Elbow flexion      Elbow extension      Wrist flexion      Wrist extension      Wrist ulnar deviation      Wrist radial deviation      Wrist pronation      Wrist supination       Passive ROM Right eval Right 03/02/23 R 04/15/23  Shoulder flexion 20*  120 118  Shoulder extension     Shoulder abduction  76 112  Shoulder adduction     Shoulder internal rotation 39* 48 25  Shoulder external rotation 18* 45 35  Elbow flexion 70*    Elbow extension 8*    Wrist flexion     Wrist extension     Wrist ulnar deviation     Wrist radial deviation     Wrist pronation 77*    Wrist supination 60*    (Blank rows = not tested)  UPPER EXTREMITY MMT: could not test R due to protocol  MMT Left eval Right R 04/15/23  Shoulder flexion 4 2+ 3  Shoulder extension 5 3- 3  Shoulder abduction 4+ 2 3-  Shoulder adduction     Shoulder internal rotation 5 3 3+  Shoulder  external rotation 4+ 2+ 3-  Middle trapezius     Lower trapezius     Elbow flexion 5    Elbow extension 5    Wrist flexion     Wrist extension     Wrist ulnar deviation     Wrist radial deviation     Wrist pronation 5    Wrist supination 5    Grip strength  (lbs) 40 lbs     (Blank rows = not tested)  JOINT MOBILITY TESTING:  NT due to protocol    TODAY'S TREATMENT:                                                                                                                                         DATE:   06/01/23 THERAPEUTIC EXERCISE: to improve flexibility, strength and mobility.  Verbal and tactile cues throughout for technique. UBE: L2.0 x 4 min (2' each fwd and back) Standing scapular retraction + GTB shoulder rows 10 x 5", cues for scapular activation/engagement with increased hold time Standing scapular retraction + GTB shoulder extension to neutral 10 x 5", cues for scapular activation/engagement with increased hold time Standing R shoulder IR with GTB and towel roll under elbow x 10, cues for scapular stabilization avoiding forward rounding of shoulder Standing R shoulder ER with RTB and towel roll under elbow x 10, cues for scapular engagement Standing B scapular retraction + GTB shoulder ER 10 x 5" Pulleys: Flexion and scaption x 3 min each, cues for proper hand alignment and direction of pull - reviewed as patient reports she has pulleys on order to be delivered soon at home Standing GTB triceps extension pull-down 10 x 5", cues to keep upper arm stationary with scapular muscles engaged while focusing on straightening elbows   05/18/23 THERAPEUTIC EXERCISE: to improve flexibility, strength and mobility.  Verbal and tactile cues throughout for technique. UBE: L1.0 x 6 min (3' each fwd and back) Measured R shoulder AROM in supine Supine R shoulder flexion with 2# DB 2x10 Supine R serratus punch with 2# DB x 20 Wall wash R shld flexion, then scaption x 10 Standing rows RTB x 10  Standing shoulder extension RTB x 10  Standing R shoulder IR/ER with RTB x 10 each    04/22/23 THERAPEUTIC EXERCISE: to improve flexibility, strength and mobility.  Verbal and tactile cues throughout for technique. UBE: L1.0 x 6 min (3' each fwd and  back) Prone R scap retraction + shoulder row x 10 Prone R scap retraction + shoulder extension x 10 Prone R scap retraction + shoulder horiz ABD x 10 S/L R scap retraction + shoulder ER 2 x 10 S/L R scap retraction + shoulder ABD 2 x 10 Hooklying R shoulder protraction at 90 flexion 2 x 10 - VC & TC cues for proper movement pattern Hooklying R shoulder CW/CCW circles at 90 flexion 2# x 10 each  direction Hooklying B shoulder flexion with 1# bar x 10 - pt reporting painful pinching sensation in right shoulder at end ROM, only mildly reduced with PT providing MWM for scapular motion  MANUAL THERAPY: To promote normalized muscle tension, improved flexibility, improved joint mobility, increased ROM, and reduced pain. STM/DTM to R lateral deltoid MWM for R scapular retraction & depression with supine B shoulder flexion   MODALITIES: Game Ready vasopneumatic compression post session to R shoulder x 10 min, low compression, 34 deg to reduce post-exercise pain and swelling/edema    PATIENT EDUCATION: Education details: HEP review, HEP update, and postural awareness  Person educated: Patient Education method: Explanation, Demonstration, Tactile cues, Verbal cues, and Handouts Education comprehension: verbalized understanding, returned demonstration, and needs further education  HOME EXERCISE PROGRAM: Access Code: [email or web address] URL: [email or web address] Date: 06/01/2023 Prepared by: Glenetta Hew  Exercises - Seated Scapular Retraction  - 2 x daily - 7 x weekly - 2 sets - 10 reps - 3-5 seconds hold - Seated Elbow Flexion and Extension AROM  - 2 x daily - 7 x weekly - 2 sets - 10 reps - Seated Shoulder Flexion Towel Slide at Table  - 2 x daily - 7 x weekly - 2 sets - 10 reps - Isometric Shoulder External Rotation  - 1 x daily - 7 x weekly - 3 sets - 5 reps - 5 sec hold - Isometric Shoulder Internal Rotation  - 1 x daily - 7 x weekly - 3 sets - 5 reps - 5 sec hold - Isometric  Shoulder Flexion  - 1 x daily - 7 x weekly - 3 sets - 5 reps - 5 sec hold - Isometric Shoulder Abduction  - 1 x daily - 7 x weekly - 3 sets - 5 reps - 5 sec hold - Seated Shoulder Flexion AAROM with Pulley Behind (Mirrored)  - 1 x daily - 7 x weekly - 2 sets - 10 reps - 3 sec hold - Seated Shoulder Abduction AAROM with Pulley Behind (Mirrored)  - 1 x daily - 7 x weekly - 2 sets - 10 reps - 3 sec hold - Seated Shoulder External Rotation AAROM with Cane and Hand in Neutral  - 1 x daily - 7 x weekly - 2 sets - 10 reps - Supine Shoulder Flexion Extension AAROM with Dowel  - 1 x daily - 7 x weekly - 2 sets - 10 reps - 3 sec hold - Supine Shoulder External Rotation in 45 Degrees Abduction AAROM with Dowel  - 1 x daily - 7 x weekly - 2 sets - 10 reps - 3 sec hold - Supine Shoulder Protraction with Dowel  - 1 x daily - 7 x weekly - 2 sets - 10 reps - 3 sec hold - Standing Shoulder Abduction ROM with Dowel  - 1 x daily - 7 x weekly - 2 sets - 10 reps - 3 sec hold - Standing Bilateral Low Shoulder Row with Anchored Resistance  - 1 x daily - 7 x weekly - 2 sets - 10 reps - 5 sec hold - Scapular Retraction with Resistance Advanced  - 1 x daily - 7 x weekly - 2 sets - 10 reps - 5 sec hold - Shoulder External Rotation with Anchored Resistance  - 1 x daily - 7 x weekly - 2 sets - 10 reps - Shoulder Internal Rotation with Resistance  - 1 x daily - 7 x weekly - 2 sets - 10 reps - Standing  Shoulder External Rotation with Resistance  - 1 x daily - 7 x weekly - 2 sets - 10 reps - 3 sec hold - Prone Scapular Retraction and Row (Mirrored)  - 1 x daily - 7 x weekly - 2 sets - 10 reps - 3 sec hold - Prone Shoulder Extension - Single Arm  - 1 x daily - 7 x weekly - 2 sets - 10 reps - 3 sec hold - Prone Single Arm Shoulder Horizontal Abduction with Scapular Retraction and Palm Down (Mirrored)  - 1 x daily - 7 x weekly - 2 sets - 10 reps - 3 sec hold - Sidelying Shoulder External Rotation (Mirrored)  - 1 x daily - 7 x weekly  - 2 sets - 10 reps - 3 sec hold - Sidelying Shoulder Abduction Palm Forward  - 1 x daily - 7 x weekly - 2 sets - 10 reps - 3 sec hold - Sidelying Shoulder Flexion 15 Degrees (Mirrored)  - 1 x daily - 7 x weekly - 2 sets - 10 reps - 3 sec hold - Supine Shoulder Flexion Extension Full Range AROM  - 1 x daily - 7 x weekly - 2 sets - 10 reps - Standing Tricep Extensions with Resistance  - 1 x daily - 7 x weekly - 2 sets - 10 reps - 3 sec hold   ASSESSMENT:  CLINICAL IMPRESSION:  Katalaya returning to PT after another 2-week absence, with attendance to PT sessions being very sporadic having completed only 9 visits in 5 months since initial eval on 01/07/2023.  She continues to report high pain levels in R shoulder but denies increased pain during exercises other than muscle burning associated with fatigue.  She continues to require extensive cueing for scapular engagement, avoiding accessory motions with shoulder shrug, as well as proper alignment to avoid close-packed positions of shoulder during motion.  Attempted to consolidate HEP however patient reporting she still wants to continue with some of the more basic exercises, although she did feel like she was ready to progress Thera-Band resistance.  GTB utilized with scapular strengthening/stabilization with good tolerance, although more fatigue noted with R shoulder IR and ER therefore encouraged continued use of RTB to ensure proper muscle engagement with decreased likelihood of substitution.  Mauriah will benefit from continued skilled PT to address ongoing postural stability, R shoulder ROM and strength deficits to improve mobility and activity tolerance with decreased pain interference.   OBJECTIVE IMPAIRMENTS: decreased activity tolerance, decreased endurance, decreased knowledge of use of DME, decreased mobility, decreased ROM, decreased strength, increased edema, increased fascial restrictions, impaired perceived functional ability, increased muscle  spasms, impaired flexibility, impaired sensation, impaired UE functional use, improper body mechanics, postural dysfunction, and pain.   ACTIVITY LIMITATIONS: carrying, lifting, bending, sitting, standing, sleeping, bed mobility, bathing, toileting, dressing, reach over head, hygiene/grooming, locomotion level, and caring for others  PARTICIPATION LIMITATIONS: meal prep, cleaning, laundry, driving, shopping, community activity, occupation, and yard work  PERSONAL FACTORS: Past/current experiences and 3+ comorbidities: Radiculopathy, Chronic back pain, Depression, OA of AC joint, L shoulder surgery - SAD/DCR   are also affecting patient's functional outcome.   REHAB POTENTIAL: Good  CLINICAL DECISION MAKING: Stable/uncomplicated  EVALUATION COMPLEXITY: Low   GOALS: Goals reviewed with patient? Yes  SHORT TERM GOALS: Target date: 01/28/2023, extended to 03/30/2023   Pt will be independent with original HEP. Baseline: Goal status: MET  03/02/23  2.  Pt will verbalize a decrease in pain by 25% to increase her  QOL and increase her tolerance for exercise and ROM. Baseline:  Goal status: IN PROGRESS  03/02/23 - patient reports consistent increased levels of pain; 04/15/23 - rates 7.5/10 which is higher pain level; 06/01/23 - pain 8/10 in recent visits  LONG TERM GOALS: Target date: 03/04/2023, extended to 06/10/2023   Pt will demonstrate independence with ongoing HEP. Baseline:  Goal status: IN PROGRESS  06/01/23 - HEP reviewed & updated today   2.  Pt will report an improvement in score by 10 points on the QuickDASH to demonstrate an increase in function and decrease in pain.  Baseline: 65.9% (04/15/23)  Goal status: IN PROGRESS  04/15/23 - 65.9%  3.  Pt will increase UE strength to grossly >/= 4/5 to address deficits seen above and improve overall function Baseline:  Goal status: IN PROGRESS  04/15/23 - R shoulder strength grossly 3 to 3+/5   4.  Pt will increase R shoulder ROM to [facility] to address  deficits seen above and improve overall function Baseline:  Goal status: IN PROGRESS  05/18/23  5.  Patient to demonstrate improved upright posture with posterior shoulder girdle engaged to promote improved glenohumeral joint mobility Baseline:  Goal status: IN PROGRESS  03/02/23 - decreased scapular activation with increased shoulder hiking evident during shoulder elevation;  04/15/23 - poor mechanics, shrugging evident R shoulder; 06/01/23 - continued poor mechanics with decreased awareness of alignment/movement patterns and evidence of substitution especially with shoulder shrug  6.  Pt will verbalize a decrease in pain of 50% to increase her QOL and increase her involvement in her community Baseline:  Goal status: IN PROGRESS  03/02/23 - patient reports consistent increased levels of pain 04/15/23 - pain same rating as last visit 6 weeks ago; 06/01/23 - pain 8/10 in recent visits   PLAN:  PT FREQUENCY: 2x/week  PT DURATION: 6 weeks  PLANNED INTERVENTIONS: Therapeutic exercises, Therapeutic activity, Neuromuscular re-education, Patient/Family education, Self Care, Joint mobilization, DME instructions, Dry Needling, Electrical stimulation, Cryotherapy, Moist heat, Taping, Vasopneumatic device, Ultrasound, Manual therapy, and Re-evaluation  PLAN FOR NEXT SESSION: MD PN for appt on 06/08/23; 23 weeks post-op as of 06/03/23 (surgery 12/14/22) - progress with ROM, strengthening to tolerance, watching scapular mechanics.    Marry Guan, PT 06/01/2023, 11:50 AM

## 2023-06-03 ENCOUNTER — Encounter: Payer: Self-pay | Admitting: Physical Therapy

## 2023-06-03 ENCOUNTER — Ambulatory Visit: Payer: Medicare HMO | Admitting: Physical Therapy

## 2023-06-03 DIAGNOSIS — M6281 Muscle weakness (generalized): Secondary | ICD-10-CM

## 2023-06-03 DIAGNOSIS — R293 Abnormal posture: Secondary | ICD-10-CM

## 2023-06-03 DIAGNOSIS — M25511 Pain in right shoulder: Secondary | ICD-10-CM | POA: Diagnosis not present

## 2023-06-03 DIAGNOSIS — M25611 Stiffness of right shoulder, not elsewhere classified: Secondary | ICD-10-CM

## 2023-06-03 DIAGNOSIS — M62838 Other muscle spasm: Secondary | ICD-10-CM

## 2023-06-03 NOTE — Therapy (Signed)
OUTPATIENT PHYSICAL THERAPY TREATMENT    Progress Note  Reporting Period 04/15/2023 to 06/03/2023   See note below for Objective Data and Assessment of Progress/Goals.     Patient Name: Robin Schmidt MRN: [id number] DOB:February 21, 1966, 57 y.o., female Today's Date: 06/03/2023  END OF SESSION:  PT End of Session - 06/03/23 1021     Visit Number 10    Date for PT Re-Evaluation 06/10/23    Authorization Type [employer]    Authorization Time Period 04/15/23 - 06/10/23    Authorization - Visit Number 5    Authorization - Number of Visits 10    Progress Note Due on Visit 16   Recert/PN on visit #6 - 04/15/23   PT Start Time 1021    PT Stop Time 1113    PT Time Calculation (min) 52 min    Activity Tolerance Patient tolerated treatment well    Behavior During Therapy [facility] for tasks assessed/performed                Past Medical History:  Diagnosis Date   Anxiety    Back pain, chronic    Depression    Past Surgical History:  Procedure Laterality Date   ANTERIOR CERVICAL DECOMP/DISCECTOMY FUSION N/A 11/22/2013   Procedure: ANTERIOR CERVICAL DECOMPRESSION/DISCECTOMY FUSION 1 LEVEL;  Surgeon: Emilee Hero, MD;  Location: [facility] OR;  Service: Orthopedics;  Laterality: N/A;  Anterior cervical decompression fusion, cervical 5-6 with instrumentation and allograft   back fusion     BACK SURGERY     KNEE ARTHROSCOPY     LAPAROSCOPIC GASTRIC SLEEVE RESECTION     TUBAL LIGATION     Patient Active Problem List   Diagnosis Date Noted   Radiculopathy 11/22/2013   Menometrorrhagia 06/08/2012    Class: Chronic    PCP: Dahlia Bailiff, MD  REFERRING PROVIDER: Beverely Low, MD  REFERRING DIAG: [phone] (ICD-10-CM) - Complete rotator cuff tear or rupture of right shoulder, not specified as traumatic   THERAPY DIAG:  Acute pain of right shoulder  Stiffness of right shoulder, not elsewhere classified  Muscle weakness (generalized)  Abnormal posture  Other muscle  spasm  RATIONALE FOR EVALUATION AND TREATMENT: Rehabilitation  ONSET DATE:  12/14/22 - R Shoulder Scope/ Mini Open Rotator Cuff Repair    NEXT MD VISIT:  06/08/23 with Dr. Ranell Patrick   SUBJECTIVE:                                                                                                                                                                                      SUBJECTIVE STATEMENT:  Pt reports reports she owke up with pain this morning. She typically  has pain all the time, but worse with activity - tries to "work through it" and pain usually stays stable but sometimes spikes.  She met her pain management for her back yesterday and has a lumbar MRI scheduled for 06/06/23.  She will f/u with Dr. Ranell Patrick on 06/08/23 or her R shoulder.  PAIN:  Are you having pain? Yes: NPRS scale: 7.8-8/10 Pain location: R upper/lateral shoulder  Pain description: sharp Aggravating factors: movement, sleeping on R side  Relieving factors: medication, warm shower, ice   PERTINENT HISTORY: Radiculopathy, Chronic back pain, Depression, OA of AC joint, L shoulder surgery - SAD/DCR 08/06/15  PRECAUTIONS: Shoulder 02/09/23: new orders received to continue and progress with behind the back, overhead, gentle PRE's with close elbow.  WEIGHT BEARING RESTRICTIONS: No  FALLS:  Has patient fallen in last 6 months? Yes. Number of falls 1 August fell in the tub due to LOB   LIVING ENVIRONMENT: Lives with: lives with their family Lives in: House/apartment Stairs: Yes: Internal: 14 steps; on right going up and External: 1 steps; none Has following equipment at home: Single point cane-does not have right now but will get it soon  OCCUPATION: Currently not working   PLOF: Independent with basic ADLs  LEISURE ACTIVITIES: walking and exercising before back and shoulder issues   PATIENT GOALS: Get ROM back, decrease overall pain    OBJECTIVE:   DIAGNOSTIC FINDINGS:  06/06/23 - R shoulder MRI  scheduled  11/19/22 - R Shoulder MRI: MRI scan reviewed in detail with patient showing a high-grade partial-thickness versus full-thickness supraspinatus rotator cuff tear. No muscular atrophy. There is some degenerative change in the superior labrum with questionable tear. Biceps located in the biceps groove. Subscap okay. Articular cartilage looks normal. AC joint with arthritis and slight outlet impingement.   PATIENT SURVEYS :  QuickDASH: 65.9% (04/15/23)  COGNITION: Overall cognitive status: Within functional limits for tasks assessed     SENSATION: N/T down the R arm sometimes, depends on movement, could also be due to wrist injury from 2 years ago  POSTURE: Rounded shoulders, forward head   UPPER EXTREMITY ROM: L WNL   Active ROM Left 03/02/23 Right 03/02/23 Right 04/15/23 Right  05/18/23 supine Right 06/03/23 seated  Shoulder flexion 150 82 96 131 115  Shoulder extension 55 32   40  Shoulder abduction 145 63 84 125- pain 106  Shoulder adduction       Shoulder internal rotation 74 46 To L5  FIR R lateral buttock  Shoulder external rotation 72 39 To T2 40 55  FER C7  Elbow flexion       Elbow extension       Wrist flexion       Wrist extension       Wrist ulnar deviation       Wrist radial deviation       Wrist pronation       Wrist supination        Passive ROM Right eval Right 03/02/23 Right 04/15/23 Right 06/03/23  Shoulder flexion 20*  120 118 135  Shoulder extension      Shoulder abduction  76 112 125  Shoulder adduction      Shoulder internal rotation 39* 48 25 55  Shoulder external rotation 18* 45 35 56  Elbow flexion 70*     Elbow extension 8*     Wrist flexion      Wrist extension      Wrist ulnar deviation  Wrist radial deviation      Wrist pronation 77*     Wrist supination 60*     (Blank rows = not tested)  UPPER EXTREMITY MMT: could not test R due to protocol  MMT Left eval Right 03/02/23 Right 04/15/23 Left 06/03/23 Right 06/03/23  Shoulder flexion 4 2+ 3  4 4-  Shoulder extension 5 3- 3 5 4+  Shoulder abduction 4+ 2 3- 5 4-  Shoulder adduction       Shoulder internal rotation 5 3 3+ 5 4  Shoulder external rotation 4+ 2+ 3- 4+ 4-  Middle trapezius       Lower trapezius       Elbow flexion 5   5 4+  Elbow extension 5   5 4-  Wrist flexion       Wrist extension       Wrist ulnar deviation       Wrist radial deviation       Wrist pronation 5      Wrist supination 5      Grip strength (lbs) 40 lbs       (Blank rows = not tested)  JOINT MOBILITY TESTING:  NT due to protocol    TODAY'S TREATMENT:                                                                                                                                         DATE:   06/03/23 THERAPEUTIC EXERCISE: to improve flexibility, strength and mobility.  Verbal and tactile cues throughout for technique. UBE: L2.0 x 8 min (4' each fwd and back) Seated flexion red Pball rollout for R shoulder flexion stretch 10 x 5" Standing R shoulder flexion orange Pball roll-up on wall with pause for stretch at top of motion 10 x 5"  THERAPEUTIC ACTIVITIES: Shoulder ROM & MMT QuickDASH = 54.5 / 100 = 54.5 %  MODALITIES: Moist heat to R shoulder x 10 min   06/01/23 THERAPEUTIC EXERCISE: to improve flexibility, strength and mobility.  Verbal and tactile cues throughout for technique. UBE: L2.0 x 4 min (2' each fwd and back) Standing scapular retraction + GTB shoulder rows 10 x 5", cues for scapular activation/engagement with increased hold time Standing scapular retraction + GTB shoulder extension to neutral 10 x 5", cues for scapular activation/engagement with increased hold time Standing R shoulder IR with GTB and towel roll under elbow x 10, cues for scapular stabilization avoiding forward rounding of shoulder Standing R shoulder ER with RTB and towel roll under elbow x 10, cues for scapular engagement Standing B scapular retraction + GTB shoulder ER 10 x 5" Pulleys: Flexion and  scaption x 3 min each, cues for proper hand alignment and direction of pull - reviewed as patient reports she has pulleys on order to be delivered soon at home Standing GTB triceps extension pull-down 10 x 5", cues to keep  upper arm stationary with scapular muscles engaged while focusing on straightening elbows   05/18/23 THERAPEUTIC EXERCISE: to improve flexibility, strength and mobility.  Verbal and tactile cues throughout for technique. UBE: L1.0 x 6 min (3' each fwd and back) Measured R shoulder AROM in supine Supine R shoulder flexion with 2# DB 2x10 Supine R serratus punch with 2# DB x 20 Wall wash R shld flexion, then scaption x 10 Standing rows RTB x 10  Standing shoulder extension RTB x 10  Standing R shoulder IR/ER with RTB x 10 each    PATIENT EDUCATION: Education details: progress with PT, ongoing PT POC, and postural awareness  Person educated: Patient Education method: Explanation, Demonstration, Tactile cues, Verbal cues, and Handouts Education comprehension: verbalized understanding, returned demonstration, and needs further education  HOME EXERCISE PROGRAM: Access Code: [email or web address] URL: [email or web address] Date: 06/01/2023 Prepared by: Glenetta Hew  Exercises - Seated Scapular Retraction  - 2 x daily - 7 x weekly - 2 sets - 10 reps - 3-5 seconds hold - Seated Elbow Flexion and Extension AROM  - 2 x daily - 7 x weekly - 2 sets - 10 reps - Seated Shoulder Flexion Towel Slide at Table  - 2 x daily - 7 x weekly - 2 sets - 10 reps - Isometric Shoulder External Rotation  - 1 x daily - 7 x weekly - 3 sets - 5 reps - 5 sec hold - Isometric Shoulder Internal Rotation  - 1 x daily - 7 x weekly - 3 sets - 5 reps - 5 sec hold - Isometric Shoulder Flexion  - 1 x daily - 7 x weekly - 3 sets - 5 reps - 5 sec hold - Isometric Shoulder Abduction  - 1 x daily - 7 x weekly - 3 sets - 5 reps - 5 sec hold - Seated Shoulder Flexion AAROM with Pulley Behind (Mirrored)  - 1 x  daily - 7 x weekly - 2 sets - 10 reps - 3 sec hold - Seated Shoulder Abduction AAROM with Pulley Behind (Mirrored)  - 1 x daily - 7 x weekly - 2 sets - 10 reps - 3 sec hold - Seated Shoulder External Rotation AAROM with Cane and Hand in Neutral  - 1 x daily - 7 x weekly - 2 sets - 10 reps - Supine Shoulder Flexion Extension AAROM with Dowel  - 1 x daily - 7 x weekly - 2 sets - 10 reps - 3 sec hold - Supine Shoulder External Rotation in 45 Degrees Abduction AAROM with Dowel  - 1 x daily - 7 x weekly - 2 sets - 10 reps - 3 sec hold - Supine Shoulder Protraction with Dowel  - 1 x daily - 7 x weekly - 2 sets - 10 reps - 3 sec hold - Standing Shoulder Abduction ROM with Dowel  - 1 x daily - 7 x weekly - 2 sets - 10 reps - 3 sec hold - Standing Bilateral Low Shoulder Row with Anchored Resistance  - 1 x daily - 7 x weekly - 2 sets - 10 reps - 5 sec hold - Scapular Retraction with Resistance Advanced  - 1 x daily - 7 x weekly - 2 sets - 10 reps - 5 sec hold - Shoulder External Rotation with Anchored Resistance  - 1 x daily - 7 x weekly - 2 sets - 10 reps - Shoulder Internal Rotation with Resistance  - 1 x daily - 7 x weekly - 2 sets -  10 reps - Standing Shoulder External Rotation with Resistance  - 1 x daily - 7 x weekly - 2 sets - 10 reps - 3 sec hold - Prone Scapular Retraction and Row (Mirrored)  - 1 x daily - 7 x weekly - 2 sets - 10 reps - 3 sec hold - Prone Shoulder Extension - Single Arm  - 1 x daily - 7 x weekly - 2 sets - 10 reps - 3 sec hold - Prone Single Arm Shoulder Horizontal Abduction with Scapular Retraction and Palm Down (Mirrored)  - 1 x daily - 7 x weekly - 2 sets - 10 reps - 3 sec hold - Sidelying Shoulder External Rotation (Mirrored)  - 1 x daily - 7 x weekly - 2 sets - 10 reps - 3 sec hold - Sidelying Shoulder Abduction Palm Forward  - 1 x daily - 7 x weekly - 2 sets - 10 reps - 3 sec hold - Sidelying Shoulder Flexion 15 Degrees (Mirrored)  - 1 x daily - 7 x weekly - 2 sets - 10 reps -  3 sec hold - Supine Shoulder Flexion Extension Full Range AROM  - 1 x daily - 7 x weekly - 2 sets - 10 reps - Standing Tricep Extensions with Resistance  - 1 x daily - 7 x weekly - 2 sets - 10 reps - 3 sec hold   ASSESSMENT:  CLINICAL IMPRESSION:  Tahesha continues to report constant moderate to high pain levels in R shoulder but denies increased pain during exercises other than muscle burning associated with fatigue.  Her R shoulder ROM is slowly improving, but she continues to require extensive cueing for scapular engagement, avoiding accessory motions with shoulder shrug, as well as proper alignment to avoid close-packed positions of shoulder during motion.  Her R shoulder strength is improving but remains limited with increased pain noted during resisted MMT.  Taut bands and TPs identified t/o R shoulder complex, which may benefit from DN.  Xara is slowly progressing toward her therapy goals and has met her QuickDASH goal, although progress has been limited by inconsistent attendance to PT (having only competed 10 visits in in 5 months since initial eval on 01/07/2023. She will benefit from continued skilled PT to address above deficits to improve mobility and activity tolerance with decreased pain interference.   OBJECTIVE IMPAIRMENTS: decreased activity tolerance, decreased endurance, decreased knowledge of use of DME, decreased mobility, decreased ROM, decreased strength, increased edema, increased fascial restrictions, impaired perceived functional ability, increased muscle spasms, impaired flexibility, impaired sensation, impaired UE functional use, improper body mechanics, postural dysfunction, and pain.   ACTIVITY LIMITATIONS: carrying, lifting, bending, sitting, standing, sleeping, bed mobility, bathing, toileting, dressing, reach over head, hygiene/grooming, locomotion level, and caring for others  PARTICIPATION LIMITATIONS: meal prep, cleaning, laundry, driving, shopping, community  activity, occupation, and yard work  PERSONAL FACTORS: Past/current experiences and 3+ comorbidities: Radiculopathy, Chronic back pain, Depression, OA of AC joint, L shoulder surgery - SAD/DCR   are also affecting patient's functional outcome.   REHAB POTENTIAL: Good  CLINICAL DECISION MAKING: Stable/uncomplicated  EVALUATION COMPLEXITY: Low   GOALS: Goals reviewed with patient? Yes  SHORT TERM GOALS: Target date: 01/28/2023, extended to 03/30/2023   Pt will be independent with original HEP. Baseline: Goal status: MET  03/02/23  2.  Pt will verbalize a decrease in pain by 25% to increase her QOL and increase her tolerance for exercise and ROM. Baseline:  Goal status: IN PROGRESS  03/02/23 - patient  reports consistent increased levels of pain; 04/15/23 - rates 7.5/10 which is higher pain level; 06/01/23 - pain 8/10 in recent visits; 06/03/23 - pt reports 20% improvement in R shoulder pain  LONG TERM GOALS: Target date: 03/04/2023, extended to 06/10/2023   Pt will demonstrate independence with ongoing HEP. Baseline:  Goal status: IN PROGRESS  06/01/23 - HEP reviewed & updated today   2.  Pt will report an improvement in score by 10 points on the QuickDASH to demonstrate an increase in function and decrease in pain.  Baseline: 65.9% (04/15/23)  Goal status: MET  04/15/23 - 65.9%; 06/03/23 - 54.5 %  3.  Pt will increase UE strength to grossly >/= 4/5 to address deficits seen above and improve overall function Baseline:  Goal status: IN PROGRESS  04/15/23 - R shoulder strength grossly 3 to 3+/5; 06/03/23 - R shoulder strength grossly 4-/5 to 4+/5 for available ROM   4.  Pt will increase R shoulder ROM to [facility] to address deficits seen above and improve overall function Baseline:  Goal status: IN PROGRESS  06/03/23 - refer to above UE ROM tables  5.  Patient to demonstrate improved upright posture with posterior shoulder girdle engaged to promote improved glenohumeral joint mobility Baseline:  Goal  status: IN PROGRESS  03/02/23 - decreased scapular activation with increased shoulder hiking evident during shoulder elevation;  04/15/23 - poor mechanics, shrugging evident R shoulder; 06/01/23 - continued poor mechanics with decreased awareness of alignment/movement patterns and evidence of substitution especially with shoulder shrug  6.  Pt will verbalize a decrease in pain of 50% to increase her QOL and increase her involvement in her community Baseline:  Goal status: IN PROGRESS  03/02/23 - patient reports consistent increased levels of pain 04/15/23 - pain same rating as last visit 6 weeks ago; 06/01/23 - pain 8/10 in recent visits; 06/03/23 - pt reports 20% improvement in R shoulder pain   PLAN:  PT FREQUENCY: 2x/week  PT DURATION: 6 weeks  PLANNED INTERVENTIONS: Therapeutic exercises, Therapeutic activity, Neuromuscular re-education, Patient/Family education, Self Care, Joint mobilization, DME instructions, Dry Needling, Electrical stimulation, Cryotherapy, Moist heat, Taping, Vasopneumatic device, Ultrasound, Manual therapy, and Re-evaluation  PLAN FOR NEXT SESSION: Recert pending MD plan as of 06/08/23 post-surgery f/u visit; 24 weeks post-op as of 06/10/23 (surgery 12/14/22) - progress with ROM, strengthening to tolerance, watching scapular mechanics; MT +/- possible DN to address abnormal muscle tension/tightness in R shoulder complex   Marry Guan, PT 06/03/2023, 12:59 PM

## 2023-06-10 ENCOUNTER — Other Ambulatory Visit: Payer: Self-pay

## 2023-06-10 ENCOUNTER — Ambulatory Visit: Payer: Medicare HMO | Attending: Orthopedic Surgery

## 2023-06-10 DIAGNOSIS — R293 Abnormal posture: Secondary | ICD-10-CM | POA: Insufficient documentation

## 2023-06-10 DIAGNOSIS — M25511 Pain in right shoulder: Secondary | ICD-10-CM | POA: Insufficient documentation

## 2023-06-10 DIAGNOSIS — M62838 Other muscle spasm: Secondary | ICD-10-CM | POA: Diagnosis present

## 2023-06-10 DIAGNOSIS — M25611 Stiffness of right shoulder, not elsewhere classified: Secondary | ICD-10-CM | POA: Diagnosis present

## 2023-06-10 DIAGNOSIS — M6281 Muscle weakness (generalized): Secondary | ICD-10-CM | POA: Insufficient documentation

## 2023-06-10 NOTE — Therapy (Signed)
OUTPATIENT PHYSICAL THERAPY RECERTIFICATION    Patient Name: Robin Schmidt MRN: [id number] DOB:Feb 21, 1966, 57 y.o., female Today's Date: 06/10/2023 Progress Note Reporting Period 01/07/23 to 06/10/23  See note below for Objective Data and Assessment of Progress/Goals.     END OF SESSION:  PT End of Session - 06/10/23 1029     Visit Number 11    Authorization Type [employer]    Progress Note Due on Visit 16    PT Start Time 1025    PT Stop Time 1110    PT Time Calculation (min) 45 min    Activity Tolerance Patient tolerated treatment well    Behavior During Therapy WFL for tasks assessed/performed                 Past Medical History:  Diagnosis Date   Anxiety    Back pain, chronic    Depression    Past Surgical History:  Procedure Laterality Date   ANTERIOR CERVICAL DECOMP/DISCECTOMY FUSION N/A 11/22/2013   Procedure: ANTERIOR CERVICAL DECOMPRESSION/DISCECTOMY FUSION 1 LEVEL;  Surgeon: Emilee Hero, MD;  Location: [facility] OR;  Service: Orthopedics;  Laterality: N/A;  Anterior cervical decompression fusion, cervical 5-6 with instrumentation and allograft   back fusion     BACK SURGERY     KNEE ARTHROSCOPY     LAPAROSCOPIC GASTRIC SLEEVE RESECTION     TUBAL LIGATION     Patient Active Problem List   Diagnosis Date Noted   Radiculopathy 11/22/2013   Menometrorrhagia 06/08/2012    Class: Chronic    PCP: Dahlia Bailiff, MD  REFERRING PROVIDER: Beverely Low, MD  REFERRING DIAG: [phone] (ICD-10-CM) - Complete rotator cuff tear or rupture of right shoulder, not specified as traumatic   THERAPY DIAG:  Acute pain of right shoulder  Stiffness of right shoulder, not elsewhere classified  Abnormal posture  Muscle weakness (generalized)  RATIONALE FOR EVALUATION AND TREATMENT: Rehabilitation  ONSET DATE:  12/14/22 - R Shoulder Scope/ Mini Open Rotator Cuff Repair    NEXT MD VISIT:  06/08/23 with Dr. Ranell Patrick   SUBJECTIVE:                                                                                                                                                                                       SUBJECTIVE STATEMENT:  Pt reports saw her surgeon and he approved one more month of Pt as well as dry needling for her. She states still painful / sore muscles R shoulder, wants the needling,soreness is top and ant shoulder GH jt line PAIN:  Are you having pain? Yes: NPRS scale: 7.8-8/10 Pain location: R upper/lateral shoulder  Pain description: sharp  Aggravating factors: movement, sleeping on R side  Relieving factors: medication, warm shower, ice   PERTINENT HISTORY: Radiculopathy, Chronic back pain, Depression, OA of AC joint, L shoulder surgery - SAD/DCR 08/06/15  PRECAUTIONS: Shoulder 02/09/23: new orders received to continue and progress with behind the back, overhead, gentle PRE's with close elbow.  WEIGHT BEARING RESTRICTIONS: No  FALLS:  Has patient fallen in last 6 months? Yes. Number of falls 1 August fell in the tub due to LOB   LIVING ENVIRONMENT: Lives with: lives with their family Lives in: House/apartment Stairs: Yes: Internal: 14 steps; on right going up and External: 1 steps; none Has following equipment at home: Single point cane-does not have right now but will get it soon  OCCUPATION: Currently not working   PLOF: Independent with basic ADLs  LEISURE ACTIVITIES: walking and exercising before back and shoulder issues   PATIENT GOALS: Get ROM back, decrease overall pain    OBJECTIVE:   DIAGNOSTIC FINDINGS:  06/06/23 - R shoulder MRI scheduled  11/19/22 - R Shoulder MRI: MRI scan reviewed in detail with patient showing a high-grade partial-thickness versus full-thickness supraspinatus rotator cuff tear. No muscular atrophy. There is some degenerative change in the superior labrum with questionable tear. Biceps located in the biceps groove. Subscap okay. Articular cartilage looks normal. AC joint with arthritis  and slight outlet impingement.   PATIENT SURVEYS :  QuickDASH: 65.9% (04/15/23)  COGNITION: Overall cognitive status: Within functional limits for tasks assessed     SENSATION: N/T down the R arm sometimes, depends on movement, could also be due to wrist injury from 2 years ago  POSTURE: Rounded shoulders, forward head   UPPER EXTREMITY ROM: L WNL   Active ROM Left 03/02/23 Right 03/02/23 Right 04/15/23 Right  05/18/23 supine Right 06/03/23 seated  Shoulder flexion 150 82 96 131 115  Shoulder extension 55 32   40  Shoulder abduction 145 63 84 125- pain 106  Shoulder adduction       Shoulder internal rotation 74 46 To L5  FIR R lateral buttock  Shoulder external rotation 72 39 To T2 40 55  FER C7  Elbow flexion       Elbow extension       Wrist flexion       Wrist extension       Wrist ulnar deviation       Wrist radial deviation       Wrist pronation       Wrist supination        Passive ROM Right eval Right 03/02/23 Right 04/15/23 Right 06/03/23  Shoulder flexion 20*  120 118 135  Shoulder extension      Shoulder abduction  76 112 125  Shoulder adduction      Shoulder internal rotation 39* 48 25 55  Shoulder external rotation 18* 45 35 56  Elbow flexion 70*     Elbow extension 8*     Wrist flexion      Wrist extension      Wrist ulnar deviation      Wrist radial deviation      Wrist pronation 77*     Wrist supination 60*     (Blank rows = not tested)  UPPER EXTREMITY MMT: could not test R due to protocol  MMT Left eval Right 03/02/23 Right 04/15/23 Left 06/03/23 Right 06/03/23  Shoulder flexion 4 2+ 3 4 4-  Shoulder extension 5 3- 3 5 4+  Shoulder abduction 4+ 2 3- 5  4-  Shoulder adduction       Shoulder internal rotation 5 3 3+ 5 4  Shoulder external rotation 4+ 2+ 3- 4+ 4-  Middle trapezius       Lower trapezius       Elbow flexion 5   5 4+  Elbow extension 5   5 4-  Wrist flexion       Wrist extension       Wrist ulnar deviation       Wrist radial deviation        Wrist pronation 5      Wrist supination 5      Grip strength (lbs) 40 lbs       (Blank rows = not tested)  JOINT MOBILITY TESTING:  NT due to protocol    TODAY'S TREATMENT:                                                                                                                                         DATE:  06/10/23:  UBE 3 min f and back Shoulder pulleys 4 min scaption After manual: Prone horizontal shoulder abd , one set 30 reps, tactile and verbal cues to engage middle traps and periscapular mm Prone R shoulder extension, cues to avoid overextending, added 3 # wt, tactile cues to avoid shrugging R shoulder, performed to fatigue, about 30 reps L side lying for R shoulder ER, no wt, to fatigue, about 30 reps Supine for scapular punches, cues verbal and tactile to engage R serratus ant and lift scapula forward, 1# wt to fatigue  Manual:  Trigger Point Dry-Needling  Treatment instructions: Expect mild to moderate muscle soreness. S/S of pneumothorax if dry needled over a lung field, and to seek immediate medical attention should they occur. Patient verbalized understanding of these instructions and education. Patient Consent Given: Yes Education handout provided: No Muscles treated: r infraspinatus, R teres minor, R upper traps, 4 sites total  Treatment response/outcome: Twitch Response Elicited and Palpable Increase in Muscle Length  Inferior glides R humeral head, gr 3 with R shoulder at 90 degrees abd, 3 bouts 60 sec each with improved tolerance R shoulder flexion     06/03/23 THERAPEUTIC EXERCISE: to improve flexibility, strength and mobility.  Verbal and tactile cues throughout for technique. UBE: L2.0 x 8 min (4' each fwd and back) Seated flexion red Pball rollout for R shoulder flexion stretch 10 x 5" Standing R shoulder flexion orange Pball roll-up on wall with pause for stretch at top of motion 10 x 5"  THERAPEUTIC ACTIVITIES: Shoulder ROM & MMT QuickDASH = 54.5  / 100 = 54.5 %  MODALITIES: Moist heat to R shoulder x 10 min   06/01/23 THERAPEUTIC EXERCISE: to improve flexibility, strength and mobility.  Verbal and tactile cues throughout for technique. UBE: L2.0 x 4 min (2' each fwd and back) Standing scapular retraction + GTB shoulder  rows 10 x 5", cues for scapular activation/engagement with increased hold time Standing scapular retraction + GTB shoulder extension to neutral 10 x 5", cues for scapular activation/engagement with increased hold time Standing R shoulder IR with GTB and towel roll under elbow x 10, cues for scapular stabilization avoiding forward rounding of shoulder Standing R shoulder ER with RTB and towel roll under elbow x 10, cues for scapular engagement Standing B scapular retraction + GTB shoulder ER 10 x 5" Pulleys: Flexion and scaption x 3 min each, cues for proper hand alignment and direction of pull - reviewed as patient reports she has pulleys on order to be delivered soon at home Standing GTB triceps extension pull-down 10 x 5", cues to keep upper arm stationary with scapular muscles engaged while focusing on straightening elbows   05/18/23 THERAPEUTIC EXERCISE: to improve flexibility, strength and mobility.  Verbal and tactile cues throughout for technique. UBE: L1.0 x 6 min (3' each fwd and back) Measured R shoulder AROM in supine Supine R shoulder flexion with 2# DB 2x10 Supine R serratus punch with 2# DB x 20 Wall wash R shld flexion, then scaption x 10 Standing rows RTB x 10  Standing shoulder extension RTB x 10  Standing R shoulder IR/ER with RTB x 10 each    PATIENT EDUCATION: Education details: progress with PT, ongoing PT POC, and postural awareness  Person educated: Patient Education method: Explanation, Demonstration, Tactile cues, Verbal cues, and Handouts Education comprehension: verbalized understanding, returned demonstration, and needs further education  HOME EXERCISE PROGRAM: Access Code:  [email or web address] URL: [email or web address] Date: 06/01/2023 Prepared by: Glenetta Hew  Exercises - Seated Scapular Retraction  - 2 x daily - 7 x weekly - 2 sets - 10 reps - 3-5 seconds hold - Seated Elbow Flexion and Extension AROM  - 2 x daily - 7 x weekly - 2 sets - 10 reps - Seated Shoulder Flexion Towel Slide at Table  - 2 x daily - 7 x weekly - 2 sets - 10 reps - Isometric Shoulder External Rotation  - 1 x daily - 7 x weekly - 3 sets - 5 reps - 5 sec hold - Isometric Shoulder Internal Rotation  - 1 x daily - 7 x weekly - 3 sets - 5 reps - 5 sec hold - Isometric Shoulder Flexion  - 1 x daily - 7 x weekly - 3 sets - 5 reps - 5 sec hold - Isometric Shoulder Abduction  - 1 x daily - 7 x weekly - 3 sets - 5 reps - 5 sec hold - Seated Shoulder Flexion AAROM with Pulley Behind (Mirrored)  - 1 x daily - 7 x weekly - 2 sets - 10 reps - 3 sec hold - Seated Shoulder Abduction AAROM with Pulley Behind (Mirrored)  - 1 x daily - 7 x weekly - 2 sets - 10 reps - 3 sec hold - Seated Shoulder External Rotation AAROM with Cane and Hand in Neutral  - 1 x daily - 7 x weekly - 2 sets - 10 reps - Supine Shoulder Flexion Extension AAROM with Dowel  - 1 x daily - 7 x weekly - 2 sets - 10 reps - 3 sec hold - Supine Shoulder External Rotation in 45 Degrees Abduction AAROM with Dowel  - 1 x daily - 7 x weekly - 2 sets - 10 reps - 3 sec hold - Supine Shoulder Protraction with Dowel  - 1 x daily - 7 x weekly - 2 sets -  10 reps - 3 sec hold - Standing Shoulder Abduction ROM with Dowel  - 1 x daily - 7 x weekly - 2 sets - 10 reps - 3 sec hold - Standing Bilateral Low Shoulder Row with Anchored Resistance  - 1 x daily - 7 x weekly - 2 sets - 10 reps - 5 sec hold - Scapular Retraction with Resistance Advanced  - 1 x daily - 7 x weekly - 2 sets - 10 reps - 5 sec hold - Shoulder External Rotation with Anchored Resistance  - 1 x daily - 7 x weekly - 2 sets - 10 reps - Shoulder Internal Rotation with Resistance  - 1 x  daily - 7 x weekly - 2 sets - 10 reps - Standing Shoulder External Rotation with Resistance  - 1 x daily - 7 x weekly - 2 sets - 10 reps - 3 sec hold - Prone Scapular Retraction and Row (Mirrored)  - 1 x daily - 7 x weekly - 2 sets - 10 reps - 3 sec hold - Prone Shoulder Extension - Single Arm  - 1 x daily - 7 x weekly - 2 sets - 10 reps - 3 sec hold - Prone Single Arm Shoulder Horizontal Abduction with Scapular Retraction and Palm Down (Mirrored)  - 1 x daily - 7 x weekly - 2 sets - 10 reps - 3 sec hold - Sidelying Shoulder External Rotation (Mirrored)  - 1 x daily - 7 x weekly - 2 sets - 10 reps - 3 sec hold - Sidelying Shoulder Abduction Palm Forward  - 1 x daily - 7 x weekly - 2 sets - 10 reps - 3 sec hold - Sidelying Shoulder Flexion 15 Degrees (Mirrored)  - 1 x daily - 7 x weekly - 2 sets - 10 reps - 3 sec hold - Supine Shoulder Flexion Extension Full Range AROM  - 1 x daily - 7 x weekly - 2 sets - 10 reps - Standing Tricep Extensions with Resistance  - 1 x daily - 7 x weekly - 2 sets - 10 reps - 3 sec hold   ASSESSMENT:  CLINICAL IMPRESSION:  Angelyse continues to report consistent pain R ant/sup shoulder jt, extended her certification for one more month through sept 5.  Really focused today on quality of movement and motor control R shoulder. Advised to rest for one to 2 days between ex to allow muscle to heal and rebuild.  She had improved tolerance and AROM R shoulder after DN and manual.  Did not reassess her ROM, strength , outcomes today as these measurements were assessed 5 days ago at last visit and she had not changed much clinically.  She will benefit from continued skilled PT to address above deficits to improve mobility and activity tolerance with decreased pain interference.   OBJECTIVE IMPAIRMENTS: decreased activity tolerance, decreased endurance, decreased knowledge of use of DME, decreased mobility, decreased ROM, decreased strength, increased edema, increased fascial  restrictions, impaired perceived functional ability, increased muscle spasms, impaired flexibility, impaired sensation, impaired UE functional use, improper body mechanics, postural dysfunction, and pain.   ACTIVITY LIMITATIONS: carrying, lifting, bending, sitting, standing, sleeping, bed mobility, bathing, toileting, dressing, reach over head, hygiene/grooming, locomotion level, and caring for others  PARTICIPATION LIMITATIONS: meal prep, cleaning, laundry, driving, shopping, community activity, occupation, and yard work  PERSONAL FACTORS: Past/current experiences and 3+ comorbidities: Radiculopathy, Chronic back pain, Depression, OA of AC joint, L shoulder surgery - SAD/DCR   are also affecting  patient's functional outcome.   REHAB POTENTIAL: Good  CLINICAL DECISION MAKING: Stable/uncomplicated  EVALUATION COMPLEXITY: Low   GOALS: Goals reviewed with patient? Yes  SHORT TERM GOALS: Target date: 01/28/2023, extended to 03/30/2023   Pt will be independent with original HEP. Baseline: Goal status: MET  03/02/23  2.  Pt will verbalize a decrease in pain by 25% to increase her QOL and increase her tolerance for exercise and ROM. Baseline:  Goal status: IN PROGRESS  03/02/23 - patient reports consistent increased levels of pain; 04/15/23 - rates 7.5/10 which is higher pain level; 06/01/23 - pain 8/10 in recent visits; 06/03/23 - pt reports 20% improvement in R shoulder pain  LONG TERM GOALS: Target date: extended to 07/15/23   Pt will demonstrate independence with ongoing HEP. Baseline:  Goal status: IN PROGRESS  06/01/23 - HEP reviewed & updated today   2.  Pt will report an improvement in score by 10 points on the QuickDASH to demonstrate an increase in function and decrease in pain.  Baseline: 65.9% (04/15/23)  Goal status: MET  04/15/23 - 65.9%; 06/03/23 - 54.5 %  3.  Pt will increase UE strength to grossly >/= 4/5 to address deficits seen above and improve overall function Baseline:  Goal  status: IN PROGRESS  04/15/23 - R shoulder strength grossly 3 to 3+/5; 06/03/23 - R shoulder strength grossly 4-/5 to 4+/5 for available ROM   4.  Pt will increase R shoulder ROM to [facility] to address deficits seen above and improve overall function Baseline:  Goal status: IN PROGRESS  06/03/23 - refer to above UE ROM tables  5.  Patient to demonstrate improved upright posture with posterior shoulder girdle engaged to promote improved glenohumeral joint mobility Baseline:  Goal status: IN PROGRESS  03/02/23 - decreased scapular activation with increased shoulder hiking evident during shoulder elevation;  04/15/23 - poor mechanics, shrugging evident R shoulder; 06/01/23 - continued poor mechanics with decreased awareness of alignment/movement patterns and evidence of substitution especially with shoulder shrug  6.  Pt will verbalize a decrease in pain of 50% to increase her QOL and increase her involvement in her community Baseline:  Goal status: IN PROGRESS  03/02/23 - patient reports consistent increased levels of pain 04/15/23 - pain same rating as last visit 6 weeks ago; 06/01/23 - pain 8/10 in recent visits; 06/03/23 - pt reports 20% improvement in R shoulder pain   PLAN:  PT FREQUENCY: 2x/week  PT DURATION: 6 weeks  PLANNED INTERVENTIONS: Therapeutic exercises, Therapeutic activity, Neuromuscular re-education, Patient/Family education, Self Care, Joint mobilization, DME instructions, Dry Needling, Electrical stimulation, Cryotherapy, Moist heat, Taping, Vasopneumatic device, Ultrasound, Manual therapy, and Re-evaluation  PLAN FOR NEXT SESSION: continue manual capsular stretching, focused ex to address scapular mechanics and depression of R GH head, rhythmic stabilization, motor control training R shoulder. Edilia Ghuman L Rafel Garde, PTDPT [facility] 06/10/2023, 11:23 AM

## 2023-06-11 ENCOUNTER — Ambulatory Visit: Payer: Medicare HMO | Admitting: Physician Assistant

## 2023-06-15 ENCOUNTER — Encounter: Payer: Self-pay | Admitting: Physician Assistant

## 2023-06-15 ENCOUNTER — Ambulatory Visit: Payer: Medicare HMO | Admitting: Physician Assistant

## 2023-06-15 VITALS — BP 124/84 | HR 83 | Temp 98.9°F | Resp 20 | Wt 183.0 lb

## 2023-06-15 DIAGNOSIS — R635 Abnormal weight gain: Secondary | ICD-10-CM | POA: Diagnosis not present

## 2023-06-15 DIAGNOSIS — Z1231 Encounter for screening mammogram for malignant neoplasm of breast: Secondary | ICD-10-CM

## 2023-06-15 DIAGNOSIS — R5383 Other fatigue: Secondary | ICD-10-CM

## 2023-06-15 DIAGNOSIS — Z903 Acquired absence of stomach [part of]: Secondary | ICD-10-CM

## 2023-06-15 DIAGNOSIS — R339 Retention of urine, unspecified: Secondary | ICD-10-CM | POA: Diagnosis not present

## 2023-06-15 DIAGNOSIS — Z1211 Encounter for screening for malignant neoplasm of colon: Secondary | ICD-10-CM

## 2023-06-15 LAB — CBC WITH DIFFERENTIAL/PLATELET
Basophils Absolute: 0 10*3/uL (ref 0.0–0.1)
Basophils Relative: 1 % (ref 0.0–3.0)
Eosinophils Absolute: 0 10*3/uL (ref 0.0–0.7)
Eosinophils Relative: 0.7 % (ref 0.0–5.0)
HCT: 42.4 % (ref 36.0–46.0)
Hemoglobin: 13.3 g/dL (ref 12.0–15.0)
Lymphocytes Relative: 27.7 % (ref 12.0–46.0)
Lymphs Abs: 1.2 10*3/uL (ref 0.7–4.0)
MCHC: 31.4 g/dL (ref 30.0–36.0)
MCV: 85.5 fl (ref 78.0–100.0)
Monocytes Absolute: 0.4 10*3/uL (ref 0.1–1.0)
Monocytes Relative: 8.3 % (ref 3.0–12.0)
Neutro Abs: 2.8 10*3/uL (ref 1.4–7.7)
Neutrophils Relative %: 62.3 % (ref 43.0–77.0)
Platelets: 236 10*3/uL (ref 150.0–400.0)
RBC: 4.96 Mil/uL (ref 3.87–5.11)
RDW: 14.6 % (ref 11.5–15.5)
WBC: 4.5 10*3/uL (ref 4.0–10.5)

## 2023-06-15 LAB — IBC + FERRITIN
Ferritin: 10.6 ng/mL (ref 10.0–291.0)
Iron: 112 ug/dL (ref 42–145)
Saturation Ratios: 24.1 % (ref 20.0–50.0)
TIBC: 464.8 ug/dL — ABNORMAL HIGH (ref 250.0–450.0)
Transferrin: 332 mg/dL (ref 212.0–360.0)

## 2023-06-15 LAB — COMPREHENSIVE METABOLIC PANEL
ALT: 10 U/L (ref 0–35)
AST: 13 U/L (ref 0–37)
Albumin: 4.3 g/dL (ref 3.5–5.2)
Alkaline Phosphatase: 86 U/L (ref 39–117)
BUN: 19 mg/dL (ref 6–23)
CO2: 27 mEq/L (ref 19–32)
Calcium: 9.8 mg/dL (ref 8.4–10.5)
Chloride: 105 mEq/L (ref 96–112)
Creatinine, Ser: 0.85 mg/dL (ref 0.40–1.20)
GFR: 75.56 mL/min (ref >60.00)
Glucose, Bld: 84 mg/dL (ref 70–99)
Potassium: 4.3 mEq/L (ref 3.5–5.1)
Sodium: 142 mEq/L (ref 135–145)
Total Bilirubin: 1.7 mg/dL — ABNORMAL HIGH (ref 0.2–1.2)
Total Protein: 7.6 g/dL (ref 6.0–8.3)

## 2023-06-15 LAB — VITAMIN D 25 HYDROXY (VIT D DEFICIENCY, FRACTURES): VITD: 29.29 ng/mL — ABNORMAL LOW (ref 30.00–100.00)

## 2023-06-15 LAB — HEMOGLOBIN A1C: Hgb A1c MFr Bld: 5.4 % (ref 4.6–6.5)

## 2023-06-15 LAB — VITAMIN B12: Vitamin B-12: 161 pg/mL — ABNORMAL LOW (ref 211–911)

## 2023-06-15 NOTE — Progress Notes (Signed)
New patient visit   Patient: Robin Schmidt   DOB: [birth year]   57 y.o. Female  MRN: [id number] Visit Date: 06/15/2023  Today's healthcare provider: Alfredia Ferguson, PA-C   Chief Complaint  Patient presents with   Urinary Frequency    Feels like she is not emptying her bladder    Weight Loss    Would like to work on losing weight    Subjective    Robin Schmidt is a 57 y.o. female who presents today as a new patient to establish care.  HPI Discussed the use of AI scribe software for clinical note transcription with the patient, who gave verbal consent to proceed.  History of Present Illness   The patient, with a past medical history of back surgeries, rotator cuff surgeries, and a gastric sleeve surgery, presents with bladder issues, fatigue, and weight gain. The bladder issues have been present for about a week, characterized by a feeling of incomplete emptying after urination. The patient denies pain during urination but reports a sensation of pressure. There is no reported change in urine color or presence of blood. The patient also has chronic back pain due to previous surgeries but reports no new or different pain.  The patient also reports fatigue and sleepiness, feeling a lack of energy for the past couple of months. She expresses a desire for intravenous fluids, feeling that she might be dehydrated. The patient admits to not drinking enough water, consuming about two to three traditional bottles a day.  The patient has been trying to lose weight after gaining a significant amount following a rotator cuff surgery in February. She expresses concern about the weight gain exacerbating her back pain. The patient has had a gastric sleeve surgery in the past and lost about 80 pounds following the procedure. She reports occasional acid reflux symptoms, especially when lying down after eating.  The patient also reports irregular menstrual cycles, occurring every few months. The  bleeding is not heavy and lasts for a couple of days.       Past Medical History:  Diagnosis Date   Anxiety    Back pain, chronic    Depression    Past Surgical History:  Procedure Laterality Date   ANTERIOR CERVICAL DECOMP/DISCECTOMY FUSION N/A 11/22/2013   Procedure: ANTERIOR CERVICAL DECOMPRESSION/DISCECTOMY FUSION 1 LEVEL;  Surgeon: Emilee Hero, MD;  Location: [facility] OR;  Service: Orthopedics;  Laterality: N/A;  Anterior cervical decompression fusion, cervical 5-6 with instrumentation and allograft   back fusion     lumbar   BACK SURGERY     KNEE ARTHROSCOPY     LAPAROSCOPIC GASTRIC SLEEVE RESECTION     2015   ROTATOR CUFF REPAIR Right 12/2022   TUBAL LIGATION     No family status information on file.   History reviewed. No pertinent family history. Social History   Socioeconomic History   Marital status: Single    Spouse name: Not on file   Number of children: Not on file   Years of education: Not on file   Highest education level: Not on file  Occupational History   Not on file  Tobacco Use   Smoking status: Never   Smokeless tobacco: Never  Substance and Sexual Activity   Alcohol use: No   Drug use: No   Sexual activity: Not on file  Other Topics Concern   Not on file  Social History Narrative   Not on file   Social Determinants of Health  Financial Resource Strain: Not on file  Food Insecurity: Not on file  Transportation Needs: Not on file  Physical Activity: Not on file  Stress: Not on file  Social Connections: Unknown (03/22/2022)   [facility]   Social Network    Social Network: Not on file   Outpatient Medications Prior to Visit  Medication Sig   celecoxib (CELEBREX) 200 MG capsule TAKE 1 CAPSULE BY MOUTH EVERY DAY AS NEEDED   methocarbamol (ROBAXIN) 500 MG tablet Take 1 tablet every 6-8 hours by oral route as needed for spasm.   ascorbic acid (VITAMIN C) 1000 MG tablet Take by mouth.   calcium citrate-vitamin D  (CITRACAL+D) 315-200 MG-UNIT tablet Take by mouth.   cyclobenzaprine (FLEXERIL) 10 MG tablet Take 10 mg by mouth 3 (three) times daily as needed for muscle spasms.   DULoxetine (CYMBALTA) 30 MG capsule Take 30 mg by mouth daily.   gabapentin (NEURONTIN) 300 MG capsule Take 300 mg by mouth 2 (two) times daily.     ibuprofen (ADVIL,MOTRIN) 600 MG tablet Take 1 tablet (600 mg total) by mouth every 6 (six) hours as needed.   Multiple Vitamins-Minerals (MULTIVITAMIN WITH MINERALS) tablet Take by mouth.   Omega-3 1000 MG CAPS Take by mouth.   [DISCONTINUED] celecoxib (CELEBREX) 100 MG capsule See admin instructions.   [DISCONTINUED] GABAPENTIN PO 100 mg Two (2) times a day.   No facility-administered medications prior to visit.   No Known Allergies  Immunization History  Administered Date(s) Administered   Tdap 05/05/2014, 07/23/2017    Health Maintenance  Topic Date Due   Medicare Annual Wellness (AWV)  Never done   HIV Screening  Never done   Hepatitis C Screening  Never done   Colonoscopy  Never done   PAP SMEAR-Modifier  05/24/2014   Zoster Vaccines- Shingrix (1 of 2) Never done   MAMMOGRAM  05/14/2018   COVID-19 Vaccine (1 - 2023-24 season) Never done   INFLUENZA VACCINE  06/10/2023   DTaP/Tdap/Td (3 - Td or Tdap) 07/24/2027   HPV VACCINES  Aged Out    Patient Care Team: Alfredia Ferguson, PA-C as PCP - General (Physician Assistant)  Review of Systems  Constitutional:  Negative for fatigue and fever.  Respiratory:  Negative for cough and shortness of breath.   Cardiovascular:  Negative for chest pain and leg swelling.  Gastrointestinal:  Negative for abdominal pain.  Genitourinary:  Positive for frequency.  Neurological:  Negative for dizziness and headaches.      Objective    BP 124/84 (BP Location: Left Arm, Patient Position: Sitting, Cuff Size: Normal)   Pulse 83   Temp 98.9 F (37.2 C) (Oral)   Resp 20   Wt 183 lb (83 kg)   LMP 03/12/2016   SpO2 99%   BMI  35.74 kg/m   Physical Exam Constitutional:      General: She is awake.     Appearance: She is well-developed.  HENT:     Head: Normocephalic.  Eyes:     Conjunctiva/sclera: Conjunctivae normal.  Cardiovascular:     Rate and Rhythm: Normal rate and regular rhythm.     Heart sounds: Normal heart sounds.  Pulmonary:     Effort: Pulmonary effort is normal.     Breath sounds: Normal breath sounds.  Abdominal:     Palpations: Abdomen is soft.     Tenderness: There is no abdominal tenderness.  Skin:    General: Skin is warm.  Neurological:  Mental Status: She is alert and oriented to person, place, and time.  Psychiatric:        Attention and Perception: Attention normal.        Mood and Affect: Mood normal.        Speech: Speech normal.        Behavior: Behavior is cooperative.     Depression Screen    06/15/2023   10:28 AM  PHQ 2/9 Scores  PHQ - 2 Score 0   No results found for any visits on 06/15/23.  Assessment & Plan     1. Incomplete emptying of bladder Pt unable to urinate in office.  Ordered culture if pt is able to return and give urine sample  2. Other fatigue - CBC w/Diff - Comp Met (CMET) - HgB A1c - Vitamin B12 - Vitamin D (25 hydroxy) - T4 AND TSH - IBC + Ferritin  3. Weight gain Reports weight gain since rotator cuff surgery in February. Chronic back pain has increased due to weight gain. -Discussed importance of balanced diet and regular exercise. -Consider referral to dietitian if weight gain continues. -pt has history of phentermine use but reports it was expensive. Advised her insurance does not cover weight loss meds  4. H/O gastric sleeve - Vitamin B12 - Vitamin D (25 hydroxy)  5. Breast cancer screening by mammogram - MM 3D SCREENING MAMMOGRAM BILATERAL BREAST; Future  6. Colon cancer screening - Cologuard   General Health Maintenance: -Schedule Pap smear/physical within the next year. -Order mammogram. -Discussed colon cancer  screening options colonoscopy vs Cologuard. -Consider shingles vaccine, advised pt get from pharmacy. -Encourage increased water intake to at least 64 ounces per day.       Return in about 6 months (around 12/16/2023) for CPE w/ pap.     I, Alfredia Ferguson, PA-C have reviewed all documentation for this visit. The documentation on  06/15/23   for the exam, diagnosis, procedures, and orders are all accurate and complete.    Alfredia Ferguson, PA-C  [facility] [phone] (phone) [phone] (fax)  [facility]

## 2023-06-16 ENCOUNTER — Other Ambulatory Visit: Payer: Self-pay

## 2023-06-16 DIAGNOSIS — Z903 Acquired absence of stomach [part of]: Secondary | ICD-10-CM

## 2023-06-16 DIAGNOSIS — R5383 Other fatigue: Secondary | ICD-10-CM

## 2023-06-16 DIAGNOSIS — E538 Deficiency of other specified B group vitamins: Secondary | ICD-10-CM

## 2023-06-17 ENCOUNTER — Encounter: Payer: Self-pay | Admitting: Physical Therapy

## 2023-06-17 ENCOUNTER — Ambulatory Visit: Payer: Medicare HMO

## 2023-06-17 ENCOUNTER — Ambulatory Visit: Payer: Medicare HMO | Admitting: Physical Therapy

## 2023-06-17 DIAGNOSIS — E538 Deficiency of other specified B group vitamins: Secondary | ICD-10-CM | POA: Diagnosis not present

## 2023-06-17 DIAGNOSIS — M6281 Muscle weakness (generalized): Secondary | ICD-10-CM

## 2023-06-17 DIAGNOSIS — R293 Abnormal posture: Secondary | ICD-10-CM

## 2023-06-17 DIAGNOSIS — M62838 Other muscle spasm: Secondary | ICD-10-CM

## 2023-06-17 DIAGNOSIS — M25511 Pain in right shoulder: Secondary | ICD-10-CM

## 2023-06-17 DIAGNOSIS — M25611 Stiffness of right shoulder, not elsewhere classified: Secondary | ICD-10-CM

## 2023-06-17 MED ORDER — CYANOCOBALAMIN 1000 MCG/ML IJ SOLN
1000.0000 ug | Freq: Once | INTRAMUSCULAR | Status: AC
Start: 2023-06-17 — End: 2023-06-17
  Administered 2023-06-17: 1000 ug via INTRAMUSCULAR

## 2023-06-17 NOTE — Progress Notes (Signed)
Pt here for 1/4 weekly B12 injection per PCP  B12 given IM L deltoid, and pt tolerated injection well.  Next B12 injection scheduled for 06/24/2023

## 2023-06-17 NOTE — Therapy (Signed)
OUTPATIENT PHYSICAL THERAPY TREATMENT    Patient Name: Robin Schmidt MRN: [id number] DOB:11-Oct-1966, 57 y.o., female Today's Date: 06/17/2023    END OF SESSION:  PT End of Session - 06/17/23 1350     Visit Number 12    Date for PT Re-Evaluation 07/15/23    Authorization Type [employer]    Authorization Time Period 06/14/23 - 07/15/23    Authorization - Visit Number 1    Authorization - Number of Visits 8    Progress Note Due on Visit 16    PT Start Time 1350    PT Stop Time 1439    PT Time Calculation (min) 49 min    Activity Tolerance Patient tolerated treatment well    Behavior During Therapy [facility] for tasks assessed/performed                 Past Medical History:  Diagnosis Date   Anxiety    Back pain, chronic    Depression    Past Surgical History:  Procedure Laterality Date   ANTERIOR CERVICAL DECOMP/DISCECTOMY FUSION N/A 11/22/2013   Procedure: ANTERIOR CERVICAL DECOMPRESSION/DISCECTOMY FUSION 1 LEVEL;  Surgeon: Emilee Hero, MD;  Location: [facility] OR;  Service: Orthopedics;  Laterality: N/A;  Anterior cervical decompression fusion, cervical 5-6 with instrumentation and allograft   back fusion     lumbar   BACK SURGERY     KNEE ARTHROSCOPY     LAPAROSCOPIC GASTRIC SLEEVE RESECTION     2015   ROTATOR CUFF REPAIR Right 12/2022   TUBAL LIGATION     Patient Active Problem List   Diagnosis Date Noted   Full thickness rotator cuff tear 11/19/2022   Tinnitus, right 07/08/2017   Carpal tunnel syndrome 07/17/2014   Obstructive sleep apnea 07/17/2014   Radiculopathy 11/22/2013   Menometrorrhagia 06/08/2012    Class: Chronic   Chronic lower back pain 04/01/2007    PCP: Alfredia Ferguson, PA-C   REFERRING PROVIDER: Beverely Low, MD  REFERRING DIAG: [phone] (ICD-10-CM) - Complete rotator cuff tear or rupture of right shoulder, not specified as traumatic   THERAPY DIAG:  Acute pain of right shoulder  Stiffness of right shoulder, not elsewhere  classified  Abnormal posture  Muscle weakness (generalized)  Other muscle spasm  RATIONALE FOR EVALUATION AND TREATMENT: Rehabilitation  ONSET DATE:  12/14/22 - R Shoulder Scope/ Mini Open Rotator Cuff Repair    NEXT MD VISIT:  06/08/23 with Dr. Ranell Patrick   SUBJECTIVE:  SUBJECTIVE STATEMENT: Pt reports the DN really helped - the pain in the back of the arm has improved the most, but pain still heightened with activity.  PAIN:  Are you having pain? Yes: NPRS scale: 7.5/10 Pain location: R upper/lateral shoulder  Pain description: sharp Aggravating factors: movement, sleeping on R side  Relieving factors: medication, warm shower, ice   PERTINENT HISTORY: Radiculopathy, Chronic back pain, Depression, OA of AC joint, L shoulder surgery - SAD/DCR 08/06/15  PRECAUTIONS: Shoulder 02/09/23: new orders received to continue and progress with behind the back, overhead, gentle PRE's with close elbow.  WEIGHT BEARING RESTRICTIONS: No  FALLS:  Has patient fallen in last 6 months? Yes. Number of falls 1 August fell in the tub due to LOB   LIVING ENVIRONMENT: Lives with: lives with their family Lives in: House/apartment Stairs: Yes: Internal: 14 steps; on right going up and External: 1 steps; none Has following equipment at home: Single point cane-does not have right now but will get it soon  OCCUPATION: Currently not working   PLOF: Independent with basic ADLs  LEISURE ACTIVITIES: walking and exercising before back and shoulder issues   PATIENT GOALS: Get ROM back, decrease overall pain    OBJECTIVE:   DIAGNOSTIC FINDINGS:  06/06/23 - R shoulder MRI scheduled  11/19/22 - R Shoulder MRI: MRI scan reviewed in detail with patient showing a high-grade partial-thickness versus full-thickness supraspinatus  rotator cuff tear. No muscular atrophy. There is some degenerative change in the superior labrum with questionable tear. Biceps located in the biceps groove. Subscap okay. Articular cartilage looks normal. AC joint with arthritis and slight outlet impingement.   PATIENT SURVEYS :  QuickDASH: 65.9% (04/15/23)  COGNITION: Overall cognitive status: Within functional limits for tasks assessed     SENSATION: N/T down the R arm sometimes, depends on movement, could also be due to wrist injury from 2 years ago  POSTURE: Rounded shoulders, forward head   UPPER EXTREMITY ROM: L WNL   Active ROM Left 03/02/23 Right 03/02/23 Right 04/15/23 Right  05/18/23 supine Right 06/03/23 seated  Shoulder flexion 150 82 96 131 115  Shoulder extension 55 32   40  Shoulder abduction 145 63 84 125- pain 106  Shoulder adduction       Shoulder internal rotation 74 46 To L5  FIR R lateral buttock  Shoulder external rotation 72 39 To T2 40 55  FER C7  Elbow flexion       Elbow extension       Wrist flexion       Wrist extension       Wrist ulnar deviation       Wrist radial deviation       Wrist pronation       Wrist supination        Passive ROM Right eval Right 03/02/23 Right 04/15/23 Right 06/03/23  Shoulder flexion 20*  120 118 135  Shoulder extension      Shoulder abduction  76 112 125  Shoulder adduction      Shoulder internal rotation 39* 48 25 55  Shoulder external rotation 18* 45 35 56  Elbow flexion 70*     Elbow extension 8*     Wrist flexion      Wrist extension      Wrist ulnar deviation      Wrist radial deviation      Wrist pronation 77*     Wrist supination 60*     (Blank rows = not  tested)  UPPER EXTREMITY MMT: could not test R due to protocol  MMT Left eval Right 03/02/23 Right 04/15/23 Left 06/03/23 Right 06/03/23  Shoulder flexion 4 2+ 3 4 4-  Shoulder extension 5 3- 3 5 4+  Shoulder abduction 4+ 2 3- 5 4-  Shoulder adduction       Shoulder internal rotation 5 3 3+ 5 4  Shoulder  external rotation 4+ 2+ 3- 4+ 4-  Middle trapezius       Lower trapezius       Elbow flexion 5   5 4+  Elbow extension 5   5 4-  Wrist flexion       Wrist extension       Wrist ulnar deviation       Wrist radial deviation       Wrist pronation 5      Wrist supination 5      Grip strength (lbs) 40 lbs       (Blank rows = not tested)  JOINT MOBILITY TESTING:  NT due to protocol    TODAY'S TREATMENT:                                                                                                                                         DATE:   06/17/23 THERAPEUTIC EXERCISE: to improve flexibility, strength and mobility.  Verbal and tactile cues throughout for technique. UBE: L2.5 x 6 min (3' each fwd and back)  MANUAL THERAPY: To promote normalized muscle tension, improved flexibility, improved joint mobility, increased ROM, and reduced pain. Skilled palpation and monitoring of soft tissue during DN Trigger Point Dry-Needling  Treatment instructions: Expect mild to moderate muscle soreness. S/S of pneumothorax if dry needled over a lung field, and to seek immediate medical attention should they occur. Patient verbalized understanding of these instructions and education. Patient Consent Given: Yes Education handout provided: Yes Muscles treated: R UT, pec major/minor, anterolateral deltoids, infraspinatus and subscapularis Electrical stimulation performed: No Parameters: N/A Treatment response/outcome: Twitch Response Elicited and Palpable Increase in Muscle Length STM/DTM, manual TPR and pin & stretch to muscles addressed with DN R subscapular release Inferior glides R humeral head, grade III with R shoulder at 90 degrees abd, 3 bouts 60 sec each with improved tolerance R shoulder flexion K-Tape for R deltoid inhibition & facilitation of scapular retraction    06/10/23:  UBE 3 min f and back Shoulder pulleys 4 min scaption After manual: Prone horizontal shoulder abd , one set 30  reps, tactile and verbal cues to engage middle traps and periscapular mm Prone R shoulder extension, cues to avoid overextending, added 3 # wt, tactile cues to avoid shrugging R shoulder, performed to fatigue, about 30 reps L side lying for R shoulder ER, no wt, to fatigue, about 30 reps Supine for scapular punches, cues verbal and tactile to engage R serratus ant and lift scapula  forward, 1# wt to fatigue  Manual:  Trigger Point Dry-Needling  Treatment instructions: Expect mild to moderate muscle soreness. S/S of pneumothorax if dry needled over a lung field, and to seek immediate medical attention should they occur. Patient verbalized understanding of these instructions and education. Patient Consent Given: Yes Education handout provided: No Muscles treated: r infraspinatus, R teres minor, R upper traps, 4 sites total Treatment response/outcome: Twitch Response Elicited and Palpable Increase in Muscle Length Inferior glides R humeral head, gr 3 with R shoulder at 90 degrees abd, 3 bouts 60 sec each with improved tolerance R shoulder flexion     06/03/23 THERAPEUTIC EXERCISE: to improve flexibility, strength and mobility.  Verbal and tactile cues throughout for technique. UBE: L2.0 x 8 min (4' each fwd and back) Seated flexion red Pball rollout for R shoulder flexion stretch 10 x 5" Standing R shoulder flexion orange Pball roll-up on wall with pause for stretch at top of motion 10 x 5"  THERAPEUTIC ACTIVITIES: Shoulder ROM & MMT QuickDASH = 54.5 / 100 = 54.5 %  MODALITIES: Moist heat to R shoulder x 10 min   PATIENT EDUCATION: Education details: postural awareness, role of DN, DN rational, procedure, outcomes, potential side effects, and recommended post-treatment exercises/activity, and Ktape wearing and removal instructions  Person educated: Patient Education method: Explanation and Handouts Education comprehension: verbalized understanding  HOME EXERCISE PROGRAM: Access Code:  [email or web address] URL: [email or web address] Date: 06/01/2023 Prepared by: Glenetta Hew  Exercises - Seated Scapular Retraction  - 2 x daily - 7 x weekly - 2 sets - 10 reps - 3-5 seconds hold - Seated Elbow Flexion and Extension AROM  - 2 x daily - 7 x weekly - 2 sets - 10 reps - Seated Shoulder Flexion Towel Slide at Table  - 2 x daily - 7 x weekly - 2 sets - 10 reps - Isometric Shoulder External Rotation  - 1 x daily - 7 x weekly - 3 sets - 5 reps - 5 sec hold - Isometric Shoulder Internal Rotation  - 1 x daily - 7 x weekly - 3 sets - 5 reps - 5 sec hold - Isometric Shoulder Flexion  - 1 x daily - 7 x weekly - 3 sets - 5 reps - 5 sec hold - Isometric Shoulder Abduction  - 1 x daily - 7 x weekly - 3 sets - 5 reps - 5 sec hold - Seated Shoulder Flexion AAROM with Pulley Behind (Mirrored)  - 1 x daily - 7 x weekly - 2 sets - 10 reps - 3 sec hold - Seated Shoulder Abduction AAROM with Pulley Behind (Mirrored)  - 1 x daily - 7 x weekly - 2 sets - 10 reps - 3 sec hold - Seated Shoulder External Rotation AAROM with Cane and Hand in Neutral  - 1 x daily - 7 x weekly - 2 sets - 10 reps - Supine Shoulder Flexion Extension AAROM with Dowel  - 1 x daily - 7 x weekly - 2 sets - 10 reps - 3 sec hold - Supine Shoulder External Rotation in 45 Degrees Abduction AAROM with Dowel  - 1 x daily - 7 x weekly - 2 sets - 10 reps - 3 sec hold - Supine Shoulder Protraction with Dowel  - 1 x daily - 7 x weekly - 2 sets - 10 reps - 3 sec hold - Standing Shoulder Abduction ROM with Dowel  - 1 x daily - 7 x weekly - 2 sets -  10 reps - 3 sec hold - Standing Bilateral Low Shoulder Row with Anchored Resistance  - 1 x daily - 7 x weekly - 2 sets - 10 reps - 5 sec hold - Scapular Retraction with Resistance Advanced  - 1 x daily - 7 x weekly - 2 sets - 10 reps - 5 sec hold - Shoulder External Rotation with Anchored Resistance  - 1 x daily - 7 x weekly - 2 sets - 10 reps - Shoulder Internal Rotation with Resistance  - 1 x  daily - 7 x weekly - 2 sets - 10 reps - Standing Shoulder External Rotation with Resistance  - 1 x daily - 7 x weekly - 2 sets - 10 reps - 3 sec hold - Prone Scapular Retraction and Row (Mirrored)  - 1 x daily - 7 x weekly - 2 sets - 10 reps - 3 sec hold - Prone Shoulder Extension - Single Arm  - 1 x daily - 7 x weekly - 2 sets - 10 reps - 3 sec hold - Prone Single Arm Shoulder Horizontal Abduction with Scapular Retraction and Palm Down (Mirrored)  - 1 x daily - 7 x weekly - 2 sets - 10 reps - 3 sec hold - Sidelying Shoulder External Rotation (Mirrored)  - 1 x daily - 7 x weekly - 2 sets - 10 reps - 3 sec hold - Sidelying Shoulder Abduction Palm Forward  - 1 x daily - 7 x weekly - 2 sets - 10 reps - 3 sec hold - Sidelying Shoulder Flexion 15 Degrees (Mirrored)  - 1 x daily - 7 x weekly - 2 sets - 10 reps - 3 sec hold - Supine Shoulder Flexion Extension Full Range AROM  - 1 x daily - 7 x weekly - 2 sets - 10 reps - Standing Tricep Extensions with Resistance  - 1 x daily - 7 x weekly - 2 sets - 10 reps - 3 sec hold   ASSESSMENT:  CLINICAL IMPRESSION:  Leshay notes benefit from initial session of DN, stating R shoulder pain now improved by 60% (STG #2 and LTG #6 now met) with improving functional movement and use of R UE since last visit and requesting to do more DN today.  Continued taut bands and TPs identified throughout R shoulder complex and addressed with MT incorporating DN, with palpable reduction in muscle tension and pain noted following manual therapy.  Attempted initial trial of kinesiotaping to further normalize muscle tension with deltoid inhibition and facilitation of scapular muscles, reminding patient to continue to be aware of posture and alignment when performing exercises at home as well as allowing for appropriate rest periods/days between exercise performance to allow for muscle recovery.  She will benefit from continued skilled PT to address ongoing pain, postural  alignment/glenohumeral dynamics, ROM and strength deficits to improve mobility and activity tolerance with decreased pain interference.   OBJECTIVE IMPAIRMENTS: decreased activity tolerance, decreased endurance, decreased knowledge of use of DME, decreased mobility, decreased ROM, decreased strength, increased edema, increased fascial restrictions, impaired perceived functional ability, increased muscle spasms, impaired flexibility, impaired sensation, impaired UE functional use, improper body mechanics, postural dysfunction, and pain.   ACTIVITY LIMITATIONS: carrying, lifting, bending, sitting, standing, sleeping, bed mobility, bathing, toileting, dressing, reach over head, hygiene/grooming, locomotion level, and caring for others  PARTICIPATION LIMITATIONS: meal prep, cleaning, laundry, driving, shopping, community activity, occupation, and yard work  PERSONAL FACTORS: Past/current experiences and 3+ comorbidities: Radiculopathy, Chronic back pain, Depression, OA of  AC joint, L shoulder surgery - SAD/DCR   are also affecting patient's functional outcome.   REHAB POTENTIAL: Good  CLINICAL DECISION MAKING: Stable/uncomplicated  EVALUATION COMPLEXITY: Low   GOALS: Goals reviewed with patient? Yes  SHORT TERM GOALS: Target date: 01/28/2023, extended to 03/30/2023   Pt will be independent with original HEP. Baseline: Goal status: MET  03/02/23  2.  Pt will verbalize a decrease in pain by 25% to increase her QOL and increase her tolerance for exercise and ROM. Baseline:  Goal status: MET  03/02/23 - patient reports consistent increased levels of pain; 04/15/23 - rates 7.5/10 which is higher pain level; 06/01/23 - pain 8/10 in recent visits; 06/03/23 - pt reports 20% improvement in R shoulder pain; 06/17/23 - 60% improvement  LONG TERM GOALS: Target date: extended to 07/15/23   Pt will demonstrate independence with ongoing HEP. Baseline:  Goal status: IN PROGRESS  06/01/23 - HEP reviewed & updated  today   2.  Pt will report an improvement in score by 10 points on the QuickDASH to demonstrate an increase in function and decrease in pain.  Baseline: 65.9% (04/15/23)  Goal status: MET  04/15/23 - 65.9%; 06/03/23 - 54.5 %  3.  Pt will increase UE strength to grossly >/= 4/5 to address deficits seen above and improve overall function Baseline:  Goal status: IN PROGRESS  04/15/23 - R shoulder strength grossly 3 to 3+/5; 06/03/23 - R shoulder strength grossly 4-/5 to 4+/5 for available ROM   4.  Pt will increase R shoulder ROM to [facility] to address deficits seen above and improve overall function Baseline:  Goal status: IN PROGRESS  06/03/23 - refer to above UE ROM tables  5.  Patient to demonstrate improved upright posture with posterior shoulder girdle engaged to promote improved glenohumeral joint mobility Baseline:  Goal status: IN PROGRESS  03/02/23 - decreased scapular activation with increased shoulder hiking evident during shoulder elevation;  04/15/23 - poor mechanics, shrugging evident R shoulder; 06/01/23 - continued poor mechanics with decreased awareness of alignment/movement patterns and evidence of substitution especially with shoulder shrug  6.  Pt will verbalize a decrease in pain of 50% to increase her QOL and increase her involvement in her community Baseline:  Goal status: MET  03/02/23 - patient reports consistent increased levels of pain 04/15/23 - pain same rating as last visit 6 weeks ago; 06/01/23 - pain 8/10 in recent visits; 06/03/23 - pt reports 20% improvement in R shoulder pain; 06/17/23 - 60% improvement   PLAN:  PT FREQUENCY: 2x/week  PT DURATION: 6 weeks  PLANNED INTERVENTIONS: Therapeutic exercises, Therapeutic activity, Neuromuscular re-education, Patient/Family education, Self Care, Joint mobilization, DME instructions, Dry Needling, Electrical stimulation, Cryotherapy, Moist heat, Taping, Vasopneumatic device, Ultrasound, Manual therapy, and Re-evaluation  PLAN FOR NEXT  SESSION: continue manual capsular stretching, focused ex to address scapular mechanics and depression of R GH head, rhythmic stabilization, motor control training R shoulder.   Marry Guan, PT 06/17/2023, 5:38 PM

## 2023-06-23 NOTE — Progress Notes (Unsigned)
Robin Schmidt is a 57 y.o. female presents to the office today for 2/4 Weekly B12 injection, per physician's orders. Original order: 06/15/2023: "Needs to set up weekly vit b 12 injections x 4 weeks, and then once a month. She should have her vit b12 level checked around a week after the 4th injection." Cyanocobalamin 1000 mg/ml IM was administered left deltoid today. Patient tolerated injection.  Patient due for follow up labs/provider appt: No.  Patient next injection due: 1 week for 3/4 weekly B12 injection, appt made Yes for 07/01/23. Windell Moulding CMA

## 2023-06-24 ENCOUNTER — Encounter: Payer: Self-pay | Admitting: Physical Therapy

## 2023-06-24 ENCOUNTER — Ambulatory Visit (INDEPENDENT_AMBULATORY_CARE_PROVIDER_SITE_OTHER): Payer: Medicare HMO

## 2023-06-24 ENCOUNTER — Ambulatory Visit: Payer: Medicare HMO | Admitting: Physical Therapy

## 2023-06-24 ENCOUNTER — Telehealth (HOSPITAL_BASED_OUTPATIENT_CLINIC_OR_DEPARTMENT_OTHER): Payer: Self-pay

## 2023-06-24 DIAGNOSIS — M25511 Pain in right shoulder: Secondary | ICD-10-CM | POA: Diagnosis not present

## 2023-06-24 DIAGNOSIS — M25611 Stiffness of right shoulder, not elsewhere classified: Secondary | ICD-10-CM

## 2023-06-24 DIAGNOSIS — E538 Deficiency of other specified B group vitamins: Secondary | ICD-10-CM

## 2023-06-24 DIAGNOSIS — R293 Abnormal posture: Secondary | ICD-10-CM

## 2023-06-24 DIAGNOSIS — M6281 Muscle weakness (generalized): Secondary | ICD-10-CM

## 2023-06-24 DIAGNOSIS — M62838 Other muscle spasm: Secondary | ICD-10-CM

## 2023-06-24 MED ORDER — CYANOCOBALAMIN 1000 MCG/ML IJ SOLN
1000.0000 ug | Freq: Once | INTRAMUSCULAR | Status: AC
Start: 2023-06-24 — End: 2023-06-24
  Administered 2023-06-24: 1000 ug via INTRAMUSCULAR

## 2023-06-24 NOTE — Therapy (Addendum)
OUTPATIENT PHYSICAL THERAPY TREATMENT    Patient Name: Robin Schmidt MRN: [id number] DOB:08/20/1966, 57 y.o., female Today's Date: 06/24/2023    END OF SESSION:  PT End of Session - 06/24/23 1015     Visit Number 13    Date for PT Re-Evaluation 07/15/23    Authorization Type [employer]    Authorization Time Period 06/14/23 - 07/15/23    Authorization - Visit Number 2    Authorization - Number of Visits 8    Progress Note Due on Visit 16    PT Start Time 1015    PT Stop Time 1109    PT Time Calculation (min) 54 min    Activity Tolerance Patient tolerated treatment well    Behavior During Therapy [facility] for tasks assessed/performed                  Past Medical History:  Diagnosis Date   Anxiety    Back pain, chronic    Depression    Past Surgical History:  Procedure Laterality Date   ANTERIOR CERVICAL DECOMP/DISCECTOMY FUSION N/A 11/22/2013   Procedure: ANTERIOR CERVICAL DECOMPRESSION/DISCECTOMY FUSION 1 LEVEL;  Surgeon: Emilee Hero, MD;  Location: [facility] OR;  Service: Orthopedics;  Laterality: N/A;  Anterior cervical decompression fusion, cervical 5-6 with instrumentation and allograft   back fusion     lumbar   BACK SURGERY     KNEE ARTHROSCOPY     LAPAROSCOPIC GASTRIC SLEEVE RESECTION     2015   ROTATOR CUFF REPAIR Right 12/2022   TUBAL LIGATION     Patient Active Problem List   Diagnosis Date Noted   Full thickness rotator cuff tear 11/19/2022   Tinnitus, right 07/08/2017   Carpal tunnel syndrome 07/17/2014   Obstructive sleep apnea 07/17/2014   Radiculopathy 11/22/2013   Menometrorrhagia 06/08/2012    Class: Chronic   Chronic lower back pain 04/01/2007    PCP: Alfredia Ferguson, PA-C   REFERRING PROVIDER: Beverely Low, MD  REFERRING DIAG: [phone] (ICD-10-CM) - Complete rotator cuff tear or rupture of right shoulder, not specified as traumatic   THERAPY DIAG:  Acute pain of right shoulder  Stiffness of right shoulder, not elsewhere  classified  Abnormal posture  Muscle weakness (generalized)  Other muscle spasm  RATIONALE FOR EVALUATION AND TREATMENT: Rehabilitation  ONSET DATE:  12/14/22 - R Shoulder Scope/ Mini Open Rotator Cuff Repair    NEXT MD VISIT:  ~07/20/23 with Dr. Ranell Patrick (6 wks from 06/08/23 appt)   SUBJECTIVE:  SUBJECTIVE STATEMENT: Pt reports her pain "is not as high as it was" (although pain rating unchanged). Still feels a little tight in the top of the shoulder, but not as much as it had been. DN helped.  PAIN:  Are you having pain? Yes: NPRS scale: 7.5/10 Pain location: R upper/lateral shoulder  Pain description: sharp Aggravating factors: movement, sleeping on R side  Relieving factors: medication, warm shower, ice   PERTINENT HISTORY: Radiculopathy, Chronic back pain, Depression, OA of AC joint, L shoulder surgery - SAD/DCR 08/06/15  PRECAUTIONS: Shoulder 02/09/23: new orders received to continue and progress with behind the back, overhead, gentle PRE's with close elbow.  WEIGHT BEARING RESTRICTIONS: No  FALLS:  Has patient fallen in last 6 months? Yes. Number of falls 1 August fell in the tub due to LOB   LIVING ENVIRONMENT: Lives with: lives with their family Lives in: House/apartment Stairs: Yes: Internal: 14 steps; on right going up and External: 1 steps; none Has following equipment at home: Single point cane-does not have right now but will get it soon  OCCUPATION: Currently not working   PLOF: Independent with basic ADLs  LEISURE ACTIVITIES: walking and exercising before back and shoulder issues   PATIENT GOALS: Get ROM back, decrease overall pain    OBJECTIVE:   DIAGNOSTIC FINDINGS:  06/06/23 - R shoulder MRI scheduled  11/19/22 - R Shoulder MRI: MRI scan reviewed in detail with patient  showing a high-grade partial-thickness versus full-thickness supraspinatus rotator cuff tear. No muscular atrophy. There is some degenerative change in the superior labrum with questionable tear. Biceps located in the biceps groove. Subscap okay. Articular cartilage looks normal. AC joint with arthritis and slight outlet impingement.   PATIENT SURVEYS :  QuickDASH: 65.9% (04/15/23)  COGNITION: Overall cognitive status: Within functional limits for tasks assessed     SENSATION: N/T down the R arm sometimes, depends on movement, could also be due to wrist injury from 2 years ago  POSTURE: Rounded shoulders, forward head   UPPER EXTREMITY ROM: L WNL   Active ROM Left 03/02/23 Right 03/02/23 Right 04/15/23 Right  05/18/23 supine Right 06/03/23 seated Right 06/23/23 seated  Shoulder flexion 150 82 96 131 115 121  Shoulder extension 55 32   40 46  Shoulder abduction 145 63 84 125- pain 106 108  Shoulder adduction        Shoulder internal rotation 74 46 To L5  FIR R lateral buttock FIR R QL level with L3  Shoulder external rotation 72 39 To T2 40 FER C7 55 FER T1  Elbow flexion        Elbow extension        Wrist flexion        Wrist extension        Wrist ulnar deviation        Wrist radial deviation        Wrist pronation        Wrist supination         Passive ROM Right eval Right 03/02/23 Right 04/15/23 Right 06/03/23  Shoulder flexion 20*  120 118 135  Shoulder extension      Shoulder abduction  76 112 125  Shoulder adduction      Shoulder internal rotation 39* 48 25 55  Shoulder external rotation 18* 45 35 56  Elbow flexion 70*     Elbow extension 8*     Wrist flexion      Wrist extension      Wrist  ulnar deviation      Wrist radial deviation      Wrist pronation 77*     Wrist supination 60*     (Blank rows = not tested)  UPPER EXTREMITY MMT: could not test R due to protocol  MMT Left eval Right 03/02/23 Right 04/15/23 Left 06/03/23 Right 06/03/23  Shoulder flexion 4 2+ 3 4 4-   Shoulder extension 5 3- 3 5 4+  Shoulder abduction 4+ 2 3- 5 4-  Shoulder adduction       Shoulder internal rotation 5 3 3+ 5 4  Shoulder external rotation 4+ 2+ 3- 4+ 4-  Middle trapezius       Lower trapezius       Elbow flexion 5   5 4+  Elbow extension 5   5 4-  Wrist flexion       Wrist extension       Wrist ulnar deviation       Wrist radial deviation       Wrist pronation 5      Wrist supination 5      Grip strength (lbs) 40 lbs       (Blank rows = not tested)  JOINT MOBILITY TESTING:  NT due to protocol    TODAY'S TREATMENT:                                                                                                                                         DATE:   06/24/23 THERAPEUTIC EXERCISE: to improve flexibility, strength and mobility.  Verbal and tactile cues throughout for technique. UBE: L3.0 x 6 min (3' each fwd and back) R shoulder IR & ER towel stretches x 10 each Serratus wall slides with arms in pillowcase pressing out against edges of pillowcase 2 x 10 - PT initially facilitating scapular retraction & depression  Serratus alt wall clocks with looped YTB at wrists x 10 bil Seated R shoulder flexion and scaption AAROM for concentric motion with PT facilitating scapular retraction and depression and pt focusing on control during eccentric lowering 2 x 5 each  THERAPEUTIC ACTIVITIES: R shoulder ROM assessment  MODALITIES:  Cold pack to R shoulder x 10 to reduce post-exercise soreness and inflammation   06/17/23 THERAPEUTIC EXERCISE: to improve flexibility, strength and mobility.  Verbal and tactile cues throughout for technique. UBE: L2.5 x 6 min (3' each fwd and back)  MANUAL THERAPY: To promote normalized muscle tension, improved flexibility, improved joint mobility, increased ROM, and reduced pain. Skilled palpation and monitoring of soft tissue during DN Trigger Point Dry-Needling  Treatment instructions: Expect mild to moderate muscle soreness.  S/S of pneumothorax if dry needled over a lung field, and to seek immediate medical attention should they occur. Patient verbalized understanding of these instructions and education. Patient Consent Given: Yes Education handout provided: Yes Muscles treated: R UT, pec major/minor, anterolateral deltoids, infraspinatus and  subscapularis Electrical stimulation performed: No Parameters: N/A Treatment response/outcome: Twitch Response Elicited and Palpable Increase in Muscle Length STM/DTM, manual TPR and pin & stretch to muscles addressed with DN R subscapular release Inferior glides R humeral head, grade III with R shoulder at 90 degrees abd, 3 bouts 60 sec each with improved tolerance R shoulder flexion K-Tape for R deltoid inhibition & facilitation of scapular retraction   06/10/23:  UBE 3 min f and back Shoulder pulleys 4 min scaption After manual: Prone horizontal shoulder abd , one set 30 reps, tactile and verbal cues to engage middle traps and periscapular mm Prone R shoulder extension, cues to avoid overextending, added 3 # wt, tactile cues to avoid shrugging R shoulder, performed to fatigue, about 30 reps L side lying for R shoulder ER, no wt, to fatigue, about 30 reps Supine for scapular punches, cues verbal and tactile to engage R serratus ant and lift scapula forward, 1# wt to fatigue  Manual:  Trigger Point Dry-Needling  Treatment instructions: Expect mild to moderate muscle soreness. S/S of pneumothorax if dry needled over a lung field, and to seek immediate medical attention should they occur. Patient verbalized understanding of these instructions and education. Patient Consent Given: Yes Education handout provided: No Muscles treated: r infraspinatus, R teres minor, R upper traps, 4 sites total Treatment response/outcome: Twitch Response Elicited and Palpable Increase in Muscle Length Inferior glides R humeral head, gr 3 with R shoulder at 90 degrees abd, 3 bouts 60 sec each  with improved tolerance R shoulder flexion   PATIENT EDUCATION: Education details: postural awareness, role of DN, DN rational, procedure, outcomes, potential side effects, and recommended post-treatment exercises/activity, and Ktape wearing and removal instructions  Person educated: Patient Education method: Explanation and Handouts Education comprehension: verbalized understanding  HOME EXERCISE PROGRAM: Access Code: [email or web address] URL: [email or web address] Date: 06/01/2023 Prepared by: Glenetta Hew  Exercises - Seated Scapular Retraction  - 2 x daily - 7 x weekly - 2 sets - 10 reps - 3-5 seconds hold - Seated Elbow Flexion and Extension AROM  - 2 x daily - 7 x weekly - 2 sets - 10 reps - Seated Shoulder Flexion Towel Slide at Table  - 2 x daily - 7 x weekly - 2 sets - 10 reps - Isometric Shoulder External Rotation  - 1 x daily - 7 x weekly - 3 sets - 5 reps - 5 sec hold - Isometric Shoulder Internal Rotation  - 1 x daily - 7 x weekly - 3 sets - 5 reps - 5 sec hold - Isometric Shoulder Flexion  - 1 x daily - 7 x weekly - 3 sets - 5 reps - 5 sec hold - Isometric Shoulder Abduction  - 1 x daily - 7 x weekly - 3 sets - 5 reps - 5 sec hold - Seated Shoulder Flexion AAROM with Pulley Behind (Mirrored)  - 1 x daily - 7 x weekly - 2 sets - 10 reps - 3 sec hold - Seated Shoulder Abduction AAROM with Pulley Behind (Mirrored)  - 1 x daily - 7 x weekly - 2 sets - 10 reps - 3 sec hold - Seated Shoulder External Rotation AAROM with Cane and Hand in Neutral  - 1 x daily - 7 x weekly - 2 sets - 10 reps - Supine Shoulder Flexion Extension AAROM with Dowel  - 1 x daily - 7 x weekly - 2 sets - 10 reps - 3 sec hold - Supine Shoulder External Rotation  in 45 Degrees Abduction AAROM with Dowel  - 1 x daily - 7 x weekly - 2 sets - 10 reps - 3 sec hold - Supine Shoulder Protraction with Dowel  - 1 x daily - 7 x weekly - 2 sets - 10 reps - 3 sec hold - Standing Shoulder Abduction ROM with Dowel  - 1 x  daily - 7 x weekly - 2 sets - 10 reps - 3 sec hold - Standing Bilateral Low Shoulder Row with Anchored Resistance  - 1 x daily - 7 x weekly - 2 sets - 10 reps - 5 sec hold - Scapular Retraction with Resistance Advanced  - 1 x daily - 7 x weekly - 2 sets - 10 reps - 5 sec hold - Shoulder External Rotation with Anchored Resistance  - 1 x daily - 7 x weekly - 2 sets - 10 reps - Shoulder Internal Rotation with Resistance  - 1 x daily - 7 x weekly - 2 sets - 10 reps - Standing Shoulder External Rotation with Resistance  - 1 x daily - 7 x weekly - 2 sets - 10 reps - 3 sec hold - Prone Scapular Retraction and Row (Mirrored)  - 1 x daily - 7 x weekly - 2 sets - 10 reps - 3 sec hold - Prone Shoulder Extension - Single Arm  - 1 x daily - 7 x weekly - 2 sets - 10 reps - 3 sec hold - Prone Single Arm Shoulder Horizontal Abduction with Scapular Retraction and Palm Down (Mirrored)  - 1 x daily - 7 x weekly - 2 sets - 10 reps - 3 sec hold - Sidelying Shoulder External Rotation (Mirrored)  - 1 x daily - 7 x weekly - 2 sets - 10 reps - 3 sec hold - Sidelying Shoulder Abduction Palm Forward  - 1 x daily - 7 x weekly - 2 sets - 10 reps - 3 sec hold - Sidelying Shoulder Flexion 15 Degrees (Mirrored)  - 1 x daily - 7 x weekly - 2 sets - 10 reps - 3 sec hold - Supine Shoulder Flexion Extension Full Range AROM  - 1 x daily - 7 x weekly - 2 sets - 10 reps - Standing Tricep Extensions with Resistance  - 1 x daily - 7 x weekly - 2 sets - 10 reps - 3 sec hold   ASSESSMENT:  CLINICAL IMPRESSION:  Robin Schmidt continues to note benefit from DN, with perceived decrease in pain reported although pain rating unchanged. ROM reassessed with slight gains noted in most motions but still limited in all planes with continued overuse of accessory motions and shoulder shrug for overhead movements. Provided instruction in use of towel for FER & FIR stretches, cautioning pt to avoid forcing painful motion. Strengthening focusing on serratus  muscle activation to reduce accessory motion/shoulder shrug but some facilitation of PT still necessary to reduce shoulder shrug. Promoted carryover into AROM with AAROM concentric raise while PT providing scapular facilitation and pt focusing on AROM eccentric lowering. Robin Schmidt will benefit from continued skilled PT to address ongoing pain, postural alignment/glenohumeral dynamics, ROM and strength deficits to improve mobility and activity tolerance with decreased pain interference.   OBJECTIVE IMPAIRMENTS: decreased activity tolerance, decreased endurance, decreased knowledge of use of DME, decreased mobility, decreased ROM, decreased strength, increased edema, increased fascial restrictions, impaired perceived functional ability, increased muscle spasms, impaired flexibility, impaired sensation, impaired UE functional use, improper body mechanics, postural dysfunction, and pain.   ACTIVITY  LIMITATIONS: carrying, lifting, bending, sitting, standing, sleeping, bed mobility, bathing, toileting, dressing, reach over head, hygiene/grooming, locomotion level, and caring for others  PARTICIPATION LIMITATIONS: meal prep, cleaning, laundry, driving, shopping, community activity, occupation, and yard work  PERSONAL FACTORS: Past/current experiences and 3+ comorbidities: Radiculopathy, Chronic back pain, Depression, OA of AC joint, L shoulder surgery - SAD/DCR   are also affecting patient's functional outcome.   REHAB POTENTIAL: Good  CLINICAL DECISION MAKING: Stable/uncomplicated  EVALUATION COMPLEXITY: Low   GOALS: Goals reviewed with patient? Yes  SHORT TERM GOALS: Target date: 01/28/2023, extended to 03/30/2023   Pt will be independent with original HEP. Baseline: Goal status: MET  03/02/23  2.  Pt will verbalize a decrease in pain by 25% to increase her QOL and increase her tolerance for exercise and ROM. Baseline:  Goal status: MET  03/02/23 - patient reports consistent increased levels of  pain; 04/15/23 - rates 7.5/10 which is higher pain level; 06/01/23 - pain 8/10 in recent visits; 06/03/23 - pt reports 20% improvement in R shoulder pain; 06/17/23 - 60% improvement  LONG TERM GOALS: Target date: extended to 07/15/23   Pt will demonstrate independence with ongoing HEP. Baseline:  Goal status: IN PROGRESS  06/01/23 - HEP reviewed & updated today   2.  Pt will report an improvement in score by 10 points on the QuickDASH to demonstrate an increase in function and decrease in pain.  Baseline: 65.9% (04/15/23)  Goal status: MET  04/15/23 - 65.9%; 06/03/23 - 54.5 %  3.  Pt will increase UE strength to grossly >/= 4/5 to address deficits seen above and improve overall function Baseline:  Goal status: IN PROGRESS  04/15/23 - R shoulder strength grossly 3 to 3+/5; 06/03/23 - R shoulder strength grossly 4-/5 to 4+/5 for available ROM   4.  Pt will increase R shoulder ROM to [facility] to address deficits seen above and improve overall function Baseline:  Goal status: IN PROGRESS  06/24/23 - refer to above UE ROM tables  5.  Patient to demonstrate improved upright posture with posterior shoulder girdle engaged to promote improved glenohumeral joint mobility Baseline:  Goal status: IN PROGRESS  03/02/23 - decreased scapular activation with increased shoulder hiking evident during shoulder elevation;  04/15/23 - poor mechanics, shrugging evident R shoulder; 06/01/23 & 06/24/23 - continued poor mechanics with decreased awareness of alignment/movement patterns and evidence of substitution especially with shoulder shrug  6.  Pt will verbalize a decrease in pain of 50% to increase her QOL and increase her involvement in her community Baseline:  Goal status: MET  03/02/23 - patient reports consistent increased levels of pain 04/15/23 - pain same rating as last visit 6 weeks ago; 06/01/23 - pain 8/10 in recent visits; 06/03/23 - pt reports 20% improvement in R shoulder pain; 06/17/23 - 60% improvement   PLAN:  PT FREQUENCY:  2x/week  PT DURATION: 6 weeks  PLANNED INTERVENTIONS: Therapeutic exercises, Therapeutic activity, Neuromuscular re-education, Patient/Family education, Self Care, Joint mobilization, DME instructions, Dry Needling, Electrical stimulation, Cryotherapy, Moist heat, Taping, Vasopneumatic device, Ultrasound, Manual therapy, and Re-evaluation  PLAN FOR NEXT SESSION: continue manual capsular stretching, focused ex to address scapular mechanics and depression of R GH head, rhythmic stabilization, motor control training R shoulder.   Marry Guan, PT 06/24/2023, 11:08 AM

## 2023-06-29 ENCOUNTER — Encounter: Payer: Medicare HMO | Admitting: Physical Therapy

## 2023-07-01 ENCOUNTER — Encounter: Payer: Self-pay | Admitting: Physical Therapy

## 2023-07-01 ENCOUNTER — Ambulatory Visit: Payer: Medicare HMO | Admitting: Physical Therapy

## 2023-07-01 ENCOUNTER — Ambulatory Visit (INDEPENDENT_AMBULATORY_CARE_PROVIDER_SITE_OTHER): Payer: Medicare HMO

## 2023-07-01 DIAGNOSIS — E538 Deficiency of other specified B group vitamins: Secondary | ICD-10-CM

## 2023-07-01 DIAGNOSIS — M62838 Other muscle spasm: Secondary | ICD-10-CM

## 2023-07-01 DIAGNOSIS — R293 Abnormal posture: Secondary | ICD-10-CM

## 2023-07-01 DIAGNOSIS — M25611 Stiffness of right shoulder, not elsewhere classified: Secondary | ICD-10-CM

## 2023-07-01 DIAGNOSIS — M25511 Pain in right shoulder: Secondary | ICD-10-CM

## 2023-07-01 DIAGNOSIS — M6281 Muscle weakness (generalized): Secondary | ICD-10-CM

## 2023-07-01 MED ORDER — CYANOCOBALAMIN 1000 MCG/ML IJ SOLN
1000.0000 ug | Freq: Once | INTRAMUSCULAR | Status: DC
Start: 2023-07-01 — End: 2023-07-01

## 2023-07-01 NOTE — Therapy (Addendum)
OUTPATIENT PHYSICAL THERAPY TREATMENT    Patient Name: Robin Schmidt MRN: [id number] DOB:[birth year], 57 y.o., female Today's Date: 07/01/2023    END OF SESSION:  PT End of Session - 07/01/23 1013     Visit Number 14    Date for PT Re-Evaluation 07/15/23    Authorization Type [employer]    Authorization Time Period 06/14/23 - 07/15/23    Authorization - Visit Number 3    Authorization - Number of Visits 8    Progress Note Due on Visit 16    PT Start Time 1013    PT Stop Time 1111    PT Time Calculation (min) 58 min    Activity Tolerance Patient tolerated treatment well    Behavior During Therapy WFL for tasks assessed/performed                   Past Medical History:  Diagnosis Date   Anxiety    Back pain, chronic    Depression    Past Surgical History:  Procedure Laterality Date   ANTERIOR CERVICAL DECOMP/DISCECTOMY FUSION N/A 11/22/2013   Procedure: ANTERIOR CERVICAL DECOMPRESSION/DISCECTOMY FUSION 1 LEVEL;  Surgeon: Emilee Hero, MD;  Location: [facility] OR;  Service: Orthopedics;  Laterality: N/A;  Anterior cervical decompression fusion, cervical 5-6 with instrumentation and allograft   back fusion     lumbar   BACK SURGERY     KNEE ARTHROSCOPY     LAPAROSCOPIC GASTRIC SLEEVE RESECTION     2015   ROTATOR CUFF REPAIR Right 12/2022   TUBAL LIGATION     Patient Active Problem List   Diagnosis Date Noted   Full thickness rotator cuff tear 11/19/2022   Tinnitus, right 07/08/2017   Carpal tunnel syndrome 07/17/2014   Obstructive sleep apnea 07/17/2014   Radiculopathy 11/22/2013   Menometrorrhagia 06/08/2012    Class: Chronic   Chronic lower back pain 04/01/2007    PCP: Alfredia Ferguson, PA-C   REFERRING PROVIDER: Beverely Low, MD  REFERRING DIAG: [phone] (ICD-10-CM) - Complete rotator cuff tear or rupture of right shoulder, not specified as traumatic   THERAPY DIAG:  Acute pain of right shoulder  Stiffness of right shoulder, not elsewhere  classified  Abnormal posture  Muscle weakness (generalized)  Other muscle spasm  RATIONALE FOR EVALUATION AND TREATMENT: Rehabilitation  ONSET DATE:  12/14/22 - R Shoulder Scope/ Mini Open Rotator Cuff Repair    NEXT MD VISIT:  ~07/20/23 with Dr. Ranell Patrick (6 wks from 06/08/23 appt)   SUBJECTIVE:  SUBJECTIVE STATEMENT: Pt reports she is just waking up and has not done a lot yet today.  Pt requesting DN today.  PAIN:  Are you having pain? Yes: NPRS scale: 7.5/10 Pain location: R upper/lateral shoulder  Pain description: sharp Aggravating factors: movement, sleeping on R side  Relieving factors: medication, warm shower, ice   PERTINENT HISTORY: Radiculopathy, Chronic back pain, Depression, OA of AC joint, L shoulder surgery - SAD/DCR 08/06/15  PRECAUTIONS: Shoulder 02/09/23: new orders received to continue and progress with behind the back, overhead, gentle PRE's with close elbow.  WEIGHT BEARING RESTRICTIONS: No  FALLS:  Has patient fallen in last 6 months? Yes. Number of falls 1 August fell in the tub due to LOB   LIVING ENVIRONMENT: Lives with: lives with their family Lives in: House/apartment Stairs: Yes: Internal: 14 steps; on right going up and External: 1 steps; none Has following equipment at home: Single point cane-does not have right now but will get it soon  OCCUPATION: Currently not working   PLOF: Independent with basic ADLs  LEISURE ACTIVITIES: walking and exercising before back and shoulder issues   PATIENT GOALS: Get ROM back, decrease overall pain    OBJECTIVE:   DIAGNOSTIC FINDINGS:  06/06/23 - R shoulder MRI scheduled  11/19/22 - R Shoulder MRI: MRI scan reviewed in detail with patient showing a high-grade partial-thickness versus full-thickness supraspinatus rotator cuff  tear. No muscular atrophy. There is some degenerative change in the superior labrum with questionable tear. Biceps located in the biceps groove. Subscap okay. Articular cartilage looks normal. AC joint with arthritis and slight outlet impingement.   PATIENT SURVEYS :  QuickDASH: 65.9% (04/15/23)  COGNITION: Overall cognitive status: Within functional limits for tasks assessed     SENSATION: N/T down the R arm sometimes, depends on movement, could also be due to wrist injury from 2 years ago  POSTURE: Rounded shoulders, forward head   UPPER EXTREMITY ROM: L WNL   Active ROM Left 03/02/23 Right 03/02/23 Right 04/15/23 Right  05/18/23 supine Right 06/03/23 seated Right 06/24/23 seated  Shoulder flexion 150 82 96 131 115 121  Shoulder extension 55 32   40 46  Shoulder abduction 145 63 84 125- pain 106 108  Shoulder adduction        Shoulder internal rotation 74 46 To L5  FIR R lateral buttock FIR R QL level with L3  Shoulder external rotation 72 39 To T2 40 FER C7 55 FER T1  Elbow flexion        Elbow extension        Wrist flexion        Wrist extension        Wrist ulnar deviation        Wrist radial deviation        Wrist pronation        Wrist supination         Passive ROM Right eval Right 03/02/23 Right 04/15/23 Right 06/03/23  Shoulder flexion 20*  120 118 135  Shoulder extension      Shoulder abduction  76 112 125  Shoulder adduction      Shoulder internal rotation 39* 48 25 55  Shoulder external rotation 18* 45 35 56  Elbow flexion 70*     Elbow extension 8*     Wrist flexion      Wrist extension      Wrist ulnar deviation      Wrist radial deviation      Wrist  pronation 77*     Wrist supination 60*     (Blank rows = not tested)  UPPER EXTREMITY MMT: could not test R due to protocol  MMT Left eval Right 03/02/23 Right 04/15/23 Left 06/03/23 Right 06/03/23  Shoulder flexion 4 2+ 3 4 4-  Shoulder extension 5 3- 3 5 4+  Shoulder abduction 4+ 2 3- 5 4-  Shoulder adduction        Shoulder internal rotation 5 3 3+ 5 4  Shoulder external rotation 4+ 2+ 3- 4+ 4-  Middle trapezius       Lower trapezius       Elbow flexion 5   5 4+  Elbow extension 5   5 4-  Wrist flexion       Wrist extension       Wrist ulnar deviation       Wrist radial deviation       Wrist pronation 5      Wrist supination 5      Grip strength (lbs) 40 lbs       (Blank rows = not tested)  JOINT MOBILITY TESTING:  NT due to protocol    TODAY'S TREATMENT:                                                                                                                                         DATE:   07/01/23 THERAPEUTIC EXERCISE: to improve flexibility, strength and mobility.  Verbal and tactile cues throughout for technique. UBE: L3.0 x 6 min (3' each fwd and back) Serratus wall slides with arms in pillowcase pressing out against edges of pillowcase 2 x 10 - PT initially facilitating scapular retraction & depression - cues to keep pillowcase taut keeping shoulder ER and horiz adducted Lower trap setting with Y slide up wall + slight lift off at top of motion 2 x 10 - 1 set each before and after DN - less substitution with more symmetrical motion after DN  MANUAL THERAPY: To promote normalized muscle tension, improved flexibility, improved joint mobility, increased ROM, and reduced pain. Skilled palpation and monitoring of soft tissue during DN Trigger Point Dry-Needling  Treatment instructions: Expect mild to moderate muscle soreness. S/S of pneumothorax if dry needled over a lung field, and to seek immediate medical attention should they occur. Patient verbalized understanding of these instructions and education. Patient Consent Given: Yes Education handout provided: Previously provided Muscles treated: R lats, teres group, infraspinatus, ant/mid/post deltoid, UT and LS Electrical stimulation performed: No Parameters: N/A Treatment response/outcome: Twitch Response Elicited and Palpable  Increase in Muscle Length STM/DTM, manual TPR and pin & stretch to muscles addressed with DN R shoulder PROM/gentle stretching   MODALITIES:  Moist heat pack to R shoulder x 10 to reduce post-exercise/MT soreness and inflammation   06/24/23 THERAPEUTIC EXERCISE: to improve flexibility, strength and mobility.  Verbal and tactile cues throughout for  technique. UBE: L3.0 x 6 min (3' each fwd and back) R shoulder IR & ER towel stretches x 10 each Serratus wall slides with arms in pillowcase pressing out against edges of pillowcase 2 x 10 - PT initially facilitating scapular retraction & depression  Serratus alt wall clocks with looped YTB at wrists x 10 bil Seated R shoulder flexion and scaption AAROM for concentric motion with PT facilitating scapular retraction and depression and pt focusing on control during eccentric lowering 2 x 5 each  THERAPEUTIC ACTIVITIES: R shoulder ROM assessment  MODALITIES:  Cold pack to R shoulder x 10 to reduce post-exercise soreness and inflammation   06/17/23 THERAPEUTIC EXERCISE: to improve flexibility, strength and mobility.  Verbal and tactile cues throughout for technique. UBE: L2.5 x 6 min (3' each fwd and back)  MANUAL THERAPY: To promote normalized muscle tension, improved flexibility, improved joint mobility, increased ROM, and reduced pain. Skilled palpation and monitoring of soft tissue during DN Trigger Point Dry-Needling  Treatment instructions: Expect mild to moderate muscle soreness. S/S of pneumothorax if dry needled over a lung field, and to seek immediate medical attention should they occur. Patient verbalized understanding of these instructions and education. Patient Consent Given: Yes Education handout provided: Yes Muscles treated: R UT, pec major/minor, anterolateral deltoids, infraspinatus and subscapularis Electrical stimulation performed: No Parameters: N/A Treatment response/outcome: Twitch Response Elicited and Palpable Increase  in Muscle Length STM/DTM, manual TPR and pin & stretch to muscles addressed with DN R subscapular release Inferior glides R humeral head, grade III with R shoulder at 90 degrees abd, 3 bouts 60 sec each with improved tolerance R shoulder flexion K-Tape for R deltoid inhibition & facilitation of scapular retraction   PATIENT EDUCATION: Education details: postural awareness, role of DN, DN rational, procedure, outcomes, potential side effects, and recommended post-treatment exercises/activity, and Ktape wearing and removal instructions  Person educated: Patient Education method: Explanation and Handouts Education comprehension: verbalized understanding  HOME EXERCISE PROGRAM: Access Code: [email or web address] URL: [email or web address] Date: 07/01/2023 Prepared by: Glenetta Hew  Exercises - Seated Scapular Retraction  - 2 x daily - 7 x weekly - 2 sets - 10 reps - 3-5 seconds hold - Seated Elbow Flexion and Extension AROM  - 2 x daily - 7 x weekly - 2 sets - 10 reps - Seated Shoulder Flexion Towel Slide at Table  - 2 x daily - 7 x weekly - 2 sets - 10 reps - Isometric Shoulder External Rotation  - 1 x daily - 7 x weekly - 3 sets - 5 reps - 5 sec hold - Isometric Shoulder Internal Rotation  - 1 x daily - 7 x weekly - 3 sets - 5 reps - 5 sec hold - Isometric Shoulder Flexion  - 1 x daily - 7 x weekly - 3 sets - 5 reps - 5 sec hold - Isometric Shoulder Abduction  - 1 x daily - 7 x weekly - 3 sets - 5 reps - 5 sec hold - Seated Shoulder Flexion AAROM with Pulley Behind (Mirrored)  - 1 x daily - 7 x weekly - 2 sets - 10 reps - 3 sec hold - Seated Shoulder Abduction AAROM with Pulley Behind (Mirrored)  - 1 x daily - 7 x weekly - 2 sets - 10 reps - 3 sec hold - Seated Shoulder External Rotation AAROM with Cane and Hand in Neutral  - 1 x daily - 7 x weekly - 2 sets - 10 reps - Supine Shoulder Flexion Extension  AAROM with Dowel  - 1 x daily - 7 x weekly - 2 sets - 10 reps - 3 sec hold - Supine  Shoulder External Rotation in 45 Degrees Abduction AAROM with Dowel  - 1 x daily - 7 x weekly - 2 sets - 10 reps - 3 sec hold - Supine Shoulder Protraction with Dowel  - 1 x daily - 7 x weekly - 2 sets - 10 reps - 3 sec hold - Standing Shoulder Abduction ROM with Dowel  - 1 x daily - 7 x weekly - 2 sets - 10 reps - 3 sec hold - Standing Bilateral Low Shoulder Row with Anchored Resistance  - 1 x daily - 3 x weekly - 2 sets - 10 reps - 5 sec hold - Scapular Retraction with Resistance Advanced  - 1 x daily - 3 x weekly - 2 sets - 10 reps - 5 sec hold - Shoulder External Rotation with Anchored Resistance  - 1 x daily - 3 x weekly - 2 sets - 10 reps - Shoulder Internal Rotation with Resistance  - 1 x daily - 3 x weekly - 2 sets - 10 reps - Standing Shoulder External Rotation with Resistance  - 1 x daily - 3 x weekly - 2 sets - 10 reps - 3 sec hold - Prone Scapular Retraction and Row (Mirrored)  - 1 x daily - 7 x weekly - 2 sets - 10 reps - 3 sec hold - Prone Shoulder Extension - Single Arm  - 1 x daily - 7 x weekly - 2 sets - 10 reps - 3 sec hold - Prone Single Arm Shoulder Horizontal Abduction with Scapular Retraction and Palm Down (Mirrored)  - 1 x daily - 7 x weekly - 2 sets - 10 reps - 3 sec hold - Sidelying Shoulder External Rotation (Mirrored)  - 1 x daily - 7 x weekly - 2 sets - 10 reps - 3 sec hold - Sidelying Shoulder Abduction Palm Forward  - 1 x daily - 7 x weekly - 2 sets - 10 reps - 3 sec hold - Sidelying Shoulder Flexion 15 Degrees (Mirrored)  - 1 x daily - 7 x weekly - 2 sets - 10 reps - 3 sec hold - Supine Shoulder Flexion Extension Full Range AROM  - 1 x daily - 7 x weekly - 2 sets - 10 reps - Standing Tricep Extensions with Resistance  - 1 x daily - 3 x weekly - 2 sets - 10 reps - 3 sec hold - Standing Overhead Shoulder External Rotation Stretch with Towel (Mirrored)  - 1 x daily - 7 x weekly - 10 reps - 3 sec hold - Standing Shoulder Internal Rotation Stretch with Towel  - 1 x daily -  7 x weekly - 10 reps - 3 sec hold - Serratus Activation at Wall  - 1 x daily - 3 x weekly - 2 sets - 10 reps - 3 sec hold - Wall Clock with Theraband  - 1 x daily - 3 x weekly - 2 sets - 10 reps - 3 sec hold   ASSESSMENT:  CLINICAL IMPRESSION:  Laruth continues to struggle w/o OH R shoulder ROM w/o substitution due to ongoing muscle tightness and periscapular weakness despite repeated VC and TC/facilitation. Addressed abnormal muscle tension with DN to several muscles as indicated above with good twitch responses elicited resulting in palpable reduction in muscle tension and better tolerance for PROM as well as more symmetrical  control during AROM.  Kalyne will benefit from continued skilled PT to address ongoing pain, postural alignment/glenohumeral dynamics, ROM and strength deficits to improve mobility and activity tolerance with decreased pain interference.   OBJECTIVE IMPAIRMENTS: decreased activity tolerance, decreased endurance, decreased knowledge of use of DME, decreased mobility, decreased ROM, decreased strength, increased edema, increased fascial restrictions, impaired perceived functional ability, increased muscle spasms, impaired flexibility, impaired sensation, impaired UE functional use, improper body mechanics, postural dysfunction, and pain.   ACTIVITY LIMITATIONS: carrying, lifting, bending, sitting, standing, sleeping, bed mobility, bathing, toileting, dressing, reach over head, hygiene/grooming, locomotion level, and caring for others  PARTICIPATION LIMITATIONS: meal prep, cleaning, laundry, driving, shopping, community activity, occupation, and yard work  PERSONAL FACTORS: Past/current experiences and 3+ comorbidities: Radiculopathy, Chronic back pain, Depression, OA of AC joint, L shoulder surgery - SAD/DCR   are also affecting patient's functional outcome.   REHAB POTENTIAL: Good  CLINICAL DECISION MAKING: Stable/uncomplicated  EVALUATION COMPLEXITY:  Low   GOALS: Goals reviewed with patient? Yes  SHORT TERM GOALS: Target date: 01/28/2023, extended to 03/30/2023   Pt will be independent with original HEP. Baseline: Goal status: MET  03/02/23  2.  Pt will verbalize a decrease in pain by 25% to increase her QOL and increase her tolerance for exercise and ROM. Baseline:  Goal status: MET  03/02/23 - patient reports consistent increased levels of pain; 04/15/23 - rates 7.5/10 which is higher pain level; 06/01/23 - pain 8/10 in recent visits; 06/03/23 - pt reports 20% improvement in R shoulder pain; 06/17/23 - 60% improvement  LONG TERM GOALS: Target date: extended to 07/15/23   Pt will demonstrate independence with ongoing HEP. Baseline:  Goal status: IN PROGRESS  06/24/23 - HEP reviewed & updated today   2.  Pt will report an improvement in score by 10 points on the QuickDASH to demonstrate an increase in function and decrease in pain.  Baseline: 65.9% (04/15/23)  Goal status: MET  04/15/23 - 65.9%; 06/03/23 - 54.5 %  3.  Pt will increase UE strength to grossly >/= 4/5 to address deficits seen above and improve overall function Baseline:  Goal status: IN PROGRESS  04/15/23 - R shoulder strength grossly 3 to 3+/5; 06/03/23 - R shoulder strength grossly 4-/5 to 4+/5 for available ROM   4.  Pt will increase R shoulder ROM to [facility] to address deficits seen above and improve overall function Baseline:  Goal status: IN PROGRESS  06/24/23 - refer to above UE ROM tables  5.  Patient to demonstrate improved upright posture with posterior shoulder girdle engaged to promote improved glenohumeral joint mobility Baseline:  Goal status: IN PROGRESS  03/02/23 - decreased scapular activation with increased shoulder hiking evident during shoulder elevation;  04/15/23 - poor mechanics, shrugging evident R shoulder; 06/01/23 & 06/24/23 - continued poor mechanics with decreased awareness of alignment/movement patterns and evidence of substitution especially with shoulder  shrug  6.  Pt will verbalize a decrease in pain of 50% to increase her QOL and increase her involvement in her community Baseline:  Goal status: MET  03/02/23 - patient reports consistent increased levels of pain 04/15/23 - pain same rating as last visit 6 weeks ago; 06/01/23 - pain 8/10 in recent visits; 06/03/23 - pt reports 20% improvement in R shoulder pain; 06/17/23 - 60% improvement   PLAN:  PT FREQUENCY: 2x/week  PT DURATION: 6 weeks  PLANNED INTERVENTIONS: Therapeutic exercises, Therapeutic activity, Neuromuscular re-education, Patient/Family education, Self Care, Joint mobilization, DME instructions, Dry Needling, Electrical  stimulation, Cryotherapy, Moist heat, Taping, Vasopneumatic device, Ultrasound, Manual therapy, and Re-evaluation  PLAN FOR NEXT SESSION: continue manual capsular stretching, focused ex to address scapular mechanics and depression of R GH head, rhythmic stabilization, motor control training R shoulder.   Marry Guan, PT 07/01/2023, 1:05 PM

## 2023-07-01 NOTE — Progress Notes (Signed)
Robin Schmidt is a 57 y.o. female presents to the office today for B12 injections 3 of 4 weekly, per physician's orders. Original order: 06/15/2023: "Needs to set up weekly vit b 12 injections x 4 weeks, and then once a month. She should have her vit b12 level checked around a week after the 4th injection."  Cyanocobalamin (med), 1000mg /ml (dose),  im (route) was administered left deltoid (location) today. Patient tolerated injection.  Patient next injection due: in one week for weekly B12 # 4 of 4 weekly, appt made Yes  [name]

## 2023-07-06 ENCOUNTER — Ambulatory Visit: Payer: Medicare HMO

## 2023-07-06 ENCOUNTER — Other Ambulatory Visit: Payer: Self-pay

## 2023-07-06 DIAGNOSIS — M25511 Pain in right shoulder: Secondary | ICD-10-CM

## 2023-07-06 DIAGNOSIS — R293 Abnormal posture: Secondary | ICD-10-CM

## 2023-07-06 DIAGNOSIS — M6281 Muscle weakness (generalized): Secondary | ICD-10-CM

## 2023-07-06 DIAGNOSIS — M25611 Stiffness of right shoulder, not elsewhere classified: Secondary | ICD-10-CM

## 2023-07-06 NOTE — Therapy (Signed)
OUTPATIENT PHYSICAL THERAPY TREATMENT    Patient Name: Robin Schmidt MRN: [id number] DOB:05/27/1966, 57 y.o., female Today's Date: 07/06/2023    END OF SESSION:  PT End of Session - 07/06/23 1751     Visit Number 15    Date for PT Re-Evaluation 07/15/23    Authorization Type [employer]    Authorization Time Period 06/14/23 - 07/15/23    Progress Note Due on Visit 16    PT Start Time 1030    PT Stop Time 1110    PT Time Calculation (min) 40 min    Activity Tolerance Patient tolerated treatment well    Behavior During Therapy [facility] for tasks assessed/performed                    Past Medical History:  Diagnosis Date   Anxiety    Back pain, chronic    Depression    Past Surgical History:  Procedure Laterality Date   ANTERIOR CERVICAL DECOMP/DISCECTOMY FUSION N/A 11/22/2013   Procedure: ANTERIOR CERVICAL DECOMPRESSION/DISCECTOMY FUSION 1 LEVEL;  Surgeon: Emilee Hero, MD;  Location: [facility] OR;  Service: Orthopedics;  Laterality: N/A;  Anterior cervical decompression fusion, cervical 5-6 with instrumentation and allograft   back fusion     lumbar   BACK SURGERY     KNEE ARTHROSCOPY     LAPAROSCOPIC GASTRIC SLEEVE RESECTION     2015   ROTATOR CUFF REPAIR Right 12/2022   TUBAL LIGATION     Patient Active Problem List   Diagnosis Date Noted   Full thickness rotator cuff tear 11/19/2022   Tinnitus, right 07/08/2017   Carpal tunnel syndrome 07/17/2014   Obstructive sleep apnea 07/17/2014   Radiculopathy 11/22/2013   Menometrorrhagia 06/08/2012    Class: Chronic   Chronic lower back pain 04/01/2007    PCP: Alfredia Ferguson, PA-C   REFERRING PROVIDER: Beverely Low, MD  REFERRING DIAG: [phone] (ICD-10-CM) - Complete rotator cuff tear or rupture of right shoulder, not specified as traumatic   THERAPY DIAG:  Acute pain of right shoulder  Stiffness of right shoulder, not elsewhere classified  Abnormal posture  Muscle weakness  (generalized)  RATIONALE FOR EVALUATION AND TREATMENT: Rehabilitation  ONSET DATE:  12/14/22 - R Shoulder Scope/ Mini Open Rotator Cuff Repair    NEXT MD VISIT:  ~07/20/23 with Dr. Ranell Patrick (6 wks from 06/08/23 appt)   SUBJECTIVE:                                                                                                                                                                                      SUBJECTIVE STATEMENT: Pt reports she is still really stiff R  shoulder  PAIN:  Are you having pain? Yes: NPRS scale: 7.5/10 Pain location: R upper/lateral shoulder  Pain description: sharp Aggravating factors: movement, sleeping on R side  Relieving factors: medication, warm shower, ice   PERTINENT HISTORY: Radiculopathy, Chronic back pain, Depression, OA of AC joint, L shoulder surgery - SAD/DCR 08/06/15  PRECAUTIONS: Shoulder 02/09/23: new orders received to continue and progress with behind the back, overhead, gentle PRE's with close elbow.  WEIGHT BEARING RESTRICTIONS: No  FALLS:  Has patient fallen in last 6 months? Yes. Number of falls 1 August fell in the tub due to LOB   LIVING ENVIRONMENT: Lives with: lives with their family Lives in: House/apartment Stairs: Yes: Internal: 14 steps; on right going up and External: 1 steps; none Has following equipment at home: Single point cane-does not have right now but will get it soon  OCCUPATION: Currently not working   PLOF: Independent with basic ADLs  LEISURE ACTIVITIES: walking and exercising before back and shoulder issues   PATIENT GOALS: Get ROM back, decrease overall pain    OBJECTIVE:   DIAGNOSTIC FINDINGS:  06/06/23 - R shoulder MRI scheduled  11/19/22 - R Shoulder MRI: MRI scan reviewed in detail with patient showing a high-grade partial-thickness versus full-thickness supraspinatus rotator cuff tear. No muscular atrophy. There is some degenerative change in the superior labrum with questionable tear. Biceps  located in the biceps groove. Subscap okay. Articular cartilage looks normal. AC joint with arthritis and slight outlet impingement.   PATIENT SURVEYS :  QuickDASH: 65.9% (04/15/23)  COGNITION: Overall cognitive status: Within functional limits for tasks assessed     SENSATION: N/T down the R arm sometimes, depends on movement, could also be due to wrist injury from 2 years ago  POSTURE: Rounded shoulders, forward head   UPPER EXTREMITY ROM: L WNL   Active ROM Left 03/02/23 Right 03/02/23 Right 04/15/23 Right  05/18/23 supine Right 06/03/23 seated Right 06/24/23 seated  Shoulder flexion 150 82 96 131 115 121  Shoulder extension 55 32   40 46  Shoulder abduction 145 63 84 125- pain 106 108  Shoulder adduction        Shoulder internal rotation 74 46 To L5  FIR R lateral buttock FIR R QL level with L3  Shoulder external rotation 72 39 To T2 40 FER C7 55 FER T1  Elbow flexion        Elbow extension        Wrist flexion        Wrist extension        Wrist ulnar deviation        Wrist radial deviation        Wrist pronation        Wrist supination         Passive ROM Right eval Right 03/02/23 Right 04/15/23 Right 06/03/23  Shoulder flexion 20*  120 118 135  Shoulder extension      Shoulder abduction  76 112 125  Shoulder adduction      Shoulder internal rotation 39* 48 25 55  Shoulder external rotation 18* 45 35 56  Elbow flexion 70*     Elbow extension 8*     Wrist flexion      Wrist extension      Wrist ulnar deviation      Wrist radial deviation      Wrist pronation 77*     Wrist supination 60*     (Blank rows = not tested)  UPPER EXTREMITY  MMT: could not test R due to protocol  MMT Left eval Right 03/02/23 Right 04/15/23 Left 06/03/23 Right 06/03/23  Shoulder flexion 4 2+ 3 4 4-  Shoulder extension 5 3- 3 5 4+  Shoulder abduction 4+ 2 3- 5 4-  Shoulder adduction       Shoulder internal rotation 5 3 3+ 5 4  Shoulder external rotation 4+ 2+ 3- 4+ 4-  Middle trapezius       Lower  trapezius       Elbow flexion 5   5 4+  Elbow extension 5   5 4-  Wrist flexion       Wrist extension       Wrist ulnar deviation       Wrist radial deviation       Wrist pronation 5      Wrist supination 5      Grip strength (lbs) 40 lbs       (Blank rows = not tested)  JOINT MOBILITY TESTING:  NT due to protocol    TODAY'S TREATMENT:                                                                                                                                         DATE: 07/06/23: Manual: Supine for inferior and AP glides R shoulder  to stretch inferior and posterior capsule, 3 bouts 60 sec each. AAROM stretching between the jt mobilizations. Trigger Point Dry-Needling  Treatment instructions: Expect mild to moderate muscle soreness. S/S of pneumothorax if dry needled over a lung field, and to seek immediate medical attention should they occur. Patient verbalized understanding of these instructions and education. Patient Consent Given: Yes Education handout provided: No Muscles treated: R pectorals Treatment response/outcome: Twitch Response Elicited and Palpable Increase in Muscle Length   Therex:  UBE 4 min, 2 F, 2 B Instructed in theraband depression R scapula(pt standing on band), while performing wall slides to reduce R shoulder shrugging Also utilized theraband for depression R scapula while performing the y slides on wall with lift off , with improved movement/mechanics noted. Seated rows, hands vertical to engage scapular retractors  Moist heat R shoulder post visit in sitting, not part of Rx session   07/01/23 THERAPEUTIC EXERCISE: to improve flexibility, strength and mobility.  Verbal and tactile cues throughout for technique. UBE: L3.0 x 6 min (3' each fwd and back) Serratus wall slides with arms in pillowcase pressing out against edges of pillowcase 2 x 10 - PT initially facilitating scapular retraction & depression - cues to keep pillowcase taut keeping shoulder  ER and horiz adducted Lower trap setting with Y slide up wall + slight lift off at top of motion 2 x 10 - 1 set each before and after DN - less substitution with more symmetrical motion after DN  MANUAL THERAPY: To promote normalized muscle tension, improved flexibility, improved joint mobility, increased  ROM, and reduced pain. Skilled palpation and monitoring of soft tissue during DN Trigger Point Dry-Needling  Treatment instructions: Expect mild to moderate muscle soreness. S/S of pneumothorax if dry needled over a lung field, and to seek immediate medical attention should they occur. Patient verbalized understanding of these instructions and education. Patient Consent Given: Yes Education handout provided: Previously provided Muscles treated: R lats, teres group, infraspinatus, ant/mid/post deltoid, UT and LS Electrical stimulation performed: No Parameters: N/A Treatment response/outcome: Twitch Response Elicited and Palpable Increase in Muscle Length STM/DTM, manual TPR and pin & stretch to muscles addressed with DN R shoulder PROM/gentle stretching   MODALITIES:  Moist heat pack to R shoulder x 10 to reduce post-exercise/MT soreness and inflammation   06/24/23 THERAPEUTIC EXERCISE: to improve flexibility, strength and mobility.  Verbal and tactile cues throughout for technique. UBE: L3.0 x 6 min (3' each fwd and back) R shoulder IR & ER towel stretches x 10 each Serratus wall slides with arms in pillowcase pressing out against edges of pillowcase 2 x 10 - PT initially facilitating scapular retraction & depression  Serratus alt wall clocks with looped YTB at wrists x 10 bil Seated R shoulder flexion and scaption AAROM for concentric motion with PT facilitating scapular retraction and depression and pt focusing on control during eccentric lowering 2 x 5 each  THERAPEUTIC EXERCISE: to improve flexibility, strength and mobility.  Demonstration, verbal and tactile cues throughout for  technique.  R shoulder ROM  MODALITIES:  Cold pack to R shoulder x 10 to reduce post-exercise soreness and inflammation   06/17/23 THERAPEUTIC EXERCISE: to improve flexibility, strength and mobility.  Verbal and tactile cues throughout for technique. UBE: L2.5 x 6 min (3' each fwd and back)  MANUAL THERAPY: To promote normalized muscle tension, improved flexibility, improved joint mobility, increased ROM, and reduced pain. Skilled palpation and monitoring of soft tissue during DN Trigger Point Dry-Needling  Treatment instructions: Expect mild to moderate muscle soreness. S/S of pneumothorax if dry needled over a lung field, and to seek immediate medical attention should they occur. Patient verbalized understanding of these instructions and education. Patient Consent Given: Yes Education handout provided: Yes Muscles treated: R UT, pec major/minor, anterolateral deltoids, infraspinatus and subscapularis Electrical stimulation performed: No Parameters: N/A Treatment response/outcome: Twitch Response Elicited and Palpable Increase in Muscle Length STM/DTM, manual TPR and pin & stretch to muscles addressed with DN R subscapular release Inferior glides R humeral head, grade III with R shoulder at 90 degrees abd, 3 bouts 60 sec each with improved tolerance R shoulder flexion K-Tape for R deltoid inhibition & facilitation of scapular retraction   PATIENT EDUCATION: Education details: postural awareness, role of DN, DN rational, procedure, outcomes, potential side effects, and recommended post-treatment exercises/activity, and Ktape wearing and removal instructions  Person educated: Patient Education method: Explanation and Handouts Education comprehension: verbalized understanding  HOME EXERCISE PROGRAM: Access Code: [email or web address] URL: [email or web address] Date: 07/01/2023 Prepared by: Glenetta Hew  Exercises - Seated Scapular Retraction  - 2 x daily - 7 x weekly - 2 sets -  10 reps - 3-5 seconds hold - Seated Elbow Flexion and Extension AROM  - 2 x daily - 7 x weekly - 2 sets - 10 reps - Seated Shoulder Flexion Towel Slide at Table  - 2 x daily - 7 x weekly - 2 sets - 10 reps - Isometric Shoulder External Rotation  - 1 x daily - 7 x weekly - 3 sets - 5 reps - 5 sec hold -  Isometric Shoulder Internal Rotation  - 1 x daily - 7 x weekly - 3 sets - 5 reps - 5 sec hold - Isometric Shoulder Flexion  - 1 x daily - 7 x weekly - 3 sets - 5 reps - 5 sec hold - Isometric Shoulder Abduction  - 1 x daily - 7 x weekly - 3 sets - 5 reps - 5 sec hold - Seated Shoulder Flexion AAROM with Pulley Behind (Mirrored)  - 1 x daily - 7 x weekly - 2 sets - 10 reps - 3 sec hold - Seated Shoulder Abduction AAROM with Pulley Behind (Mirrored)  - 1 x daily - 7 x weekly - 2 sets - 10 reps - 3 sec hold - Seated Shoulder External Rotation AAROM with Cane and Hand in Neutral  - 1 x daily - 7 x weekly - 2 sets - 10 reps - Supine Shoulder Flexion Extension AAROM with Dowel  - 1 x daily - 7 x weekly - 2 sets - 10 reps - 3 sec hold - Supine Shoulder External Rotation in 45 Degrees Abduction AAROM with Dowel  - 1 x daily - 7 x weekly - 2 sets - 10 reps - 3 sec hold - Supine Shoulder Protraction with Dowel  - 1 x daily - 7 x weekly - 2 sets - 10 reps - 3 sec hold - Standing Shoulder Abduction ROM with Dowel  - 1 x daily - 7 x weekly - 2 sets - 10 reps - 3 sec hold - Standing Bilateral Low Shoulder Row with Anchored Resistance  - 1 x daily - 3 x weekly - 2 sets - 10 reps - 5 sec hold - Scapular Retraction with Resistance Advanced  - 1 x daily - 3 x weekly - 2 sets - 10 reps - 5 sec hold - Shoulder External Rotation with Anchored Resistance  - 1 x daily - 3 x weekly - 2 sets - 10 reps - Shoulder Internal Rotation with Resistance  - 1 x daily - 3 x weekly - 2 sets - 10 reps - Standing Shoulder External Rotation with Resistance  - 1 x daily - 3 x weekly - 2 sets - 10 reps - 3 sec hold - Prone Scapular  Retraction and Row (Mirrored)  - 1 x daily - 7 x weekly - 2 sets - 10 reps - 3 sec hold - Prone Shoulder Extension - Single Arm  - 1 x daily - 7 x weekly - 2 sets - 10 reps - 3 sec hold - Prone Single Arm Shoulder Horizontal Abduction with Scapular Retraction and Palm Down (Mirrored)  - 1 x daily - 7 x weekly - 2 sets - 10 reps - 3 sec hold - Sidelying Shoulder External Rotation (Mirrored)  - 1 x daily - 7 x weekly - 2 sets - 10 reps - 3 sec hold - Sidelying Shoulder Abduction Palm Forward  - 1 x daily - 7 x weekly - 2 sets - 10 reps - 3 sec hold - Sidelying Shoulder Flexion 15 Degrees (Mirrored)  - 1 x daily - 7 x weekly - 2 sets - 10 reps - 3 sec hold - Supine Shoulder Flexion Extension Full Range AROM  - 1 x daily - 7 x weekly - 2 sets - 10 reps - Standing Tricep Extensions with Resistance  - 1 x daily - 3 x weekly - 2 sets - 10 reps - 3 sec hold - Standing Overhead Shoulder External Rotation Stretch  with Towel (Mirrored)  - 1 x daily - 7 x weekly - 10 reps - 3 sec hold - Standing Shoulder Internal Rotation Stretch with Towel  - 1 x daily - 7 x weekly - 10 reps - 3 sec hold - Serratus Activation at Wall  - 1 x daily - 3 x weekly - 2 sets - 10 reps - 3 sec hold - Wall Clock with Theraband  - 1 x daily - 3 x weekly - 2 sets - 10 reps - 3 sec hold   ASSESSMENT:  CLINICAL IMPRESSION:  Shianne continues to attend skilled PT to address R shoulder function following rotator cuff injury and surgical repair.  Mechanics are still off with shrugging R shoulder with elevation on R.  She responds well to manual stretching, improves mechanics after stretch. Showed her another technique for self capsular stretch on R today.  She is due for reassessment next week. Nali will benefit from continued skilled PT until that time to address ongoing pain, postural alignment/glenohumeral dynamics, ROM and strength deficits to improve mobility and activity tolerance with decreased pain interference.   OBJECTIVE  IMPAIRMENTS: decreased activity tolerance, decreased endurance, decreased knowledge of use of DME, decreased mobility, decreased ROM, decreased strength, increased edema, increased fascial restrictions, impaired perceived functional ability, increased muscle spasms, impaired flexibility, impaired sensation, impaired UE functional use, improper body mechanics, postural dysfunction, and pain.   ACTIVITY LIMITATIONS: carrying, lifting, bending, sitting, standing, sleeping, bed mobility, bathing, toileting, dressing, reach over head, hygiene/grooming, locomotion level, and caring for others  PARTICIPATION LIMITATIONS: meal prep, cleaning, laundry, driving, shopping, community activity, occupation, and yard work  PERSONAL FACTORS: Past/current experiences and 3+ comorbidities: Radiculopathy, Chronic back pain, Depression, OA of AC joint, L shoulder surgery - SAD/DCR   are also affecting patient's functional outcome.   REHAB POTENTIAL: Good  CLINICAL DECISION MAKING: Stable/uncomplicated  EVALUATION COMPLEXITY: Low   GOALS: Goals reviewed with patient? Yes  SHORT TERM GOALS: Target date: 01/28/2023, extended to 03/30/2023   Pt will be independent with original HEP. Baseline: Goal status: MET  03/02/23  2.  Pt will verbalize a decrease in pain by 25% to increase her QOL and increase her tolerance for exercise and ROM. Baseline:  Goal status: MET  03/02/23 - patient reports consistent increased levels of pain; 04/15/23 - rates 7.5/10 which is higher pain level; 06/01/23 - pain 8/10 in recent visits; 06/03/23 - pt reports 20% improvement in R shoulder pain; 06/17/23 - 60% improvement  LONG TERM GOALS: Target date: extended to 07/15/23   Pt will demonstrate independence with ongoing HEP. Baseline:  Goal status: IN PROGRESS  06/24/23 - HEP reviewed & updated today   2.  Pt will report an improvement in score by 10 points on the QuickDASH to demonstrate an increase in function and decrease in pain.   Baseline: 65.9% (04/15/23)  Goal status: MET  04/15/23 - 65.9%; 06/03/23 - 54.5 %  3.  Pt will increase UE strength to grossly >/= 4/5 to address deficits seen above and improve overall function Baseline:  Goal status: IN PROGRESS  04/15/23 - R shoulder strength grossly 3 to 3+/5; 06/03/23 - R shoulder strength grossly 4-/5 to 4+/5 for available ROM   4.  Pt will increase R shoulder ROM to [facility] to address deficits seen above and improve overall function Baseline:  Goal status: IN PROGRESS  06/24/23 - refer to above UE ROM tables  5.  Patient to demonstrate improved upright posture with posterior shoulder girdle engaged  to promote improved glenohumeral joint mobility Baseline:  Goal status: IN PROGRESS  03/02/23 - decreased scapular activation with increased shoulder hiking evident during shoulder elevation;  04/15/23 - poor mechanics, shrugging evident R shoulder; 06/01/23 & 06/24/23 - continued poor mechanics with decreased awareness of alignment/movement patterns and evidence of substitution especially with shoulder shrug  6.  Pt will verbalize a decrease in pain of 50% to increase her QOL and increase her involvement in her community Baseline:  Goal status: MET  03/02/23 - patient reports consistent increased levels of pain 04/15/23 - pain same rating as last visit 6 weeks ago; 06/01/23 - pain 8/10 in recent visits; 06/03/23 - pt reports 20% improvement in R shoulder pain; 06/17/23 - 60% improvement   PLAN:  PT FREQUENCY: 2x/week  PT DURATION: 6 weeks  PLANNED INTERVENTIONS: Therapeutic exercises, Therapeutic activity, Neuromuscular re-education, Patient/Family education, Self Care, Joint mobilization, DME instructions, Dry Needling, Electrical stimulation, Cryotherapy, Moist heat, Taping, Vasopneumatic device, Ultrasound, Manual therapy, and Re-evaluation  PLAN FOR NEXT SESSION: continue manual capsular stretching, focused ex to address scapular mechanics and depression of R GH head, rhythmic  stabilization, motor control training R shoulder.   Samira Acero L Monserrate Blaschke, PT 07/06/2023, 5:59 PM

## 2023-07-08 ENCOUNTER — Ambulatory Visit: Payer: Medicare HMO | Admitting: Physical Therapy

## 2023-07-08 ENCOUNTER — Telehealth (HOSPITAL_BASED_OUTPATIENT_CLINIC_OR_DEPARTMENT_OTHER): Payer: Self-pay

## 2023-07-08 ENCOUNTER — Ambulatory Visit (INDEPENDENT_AMBULATORY_CARE_PROVIDER_SITE_OTHER): Payer: Medicare HMO

## 2023-07-08 DIAGNOSIS — M25611 Stiffness of right shoulder, not elsewhere classified: Secondary | ICD-10-CM

## 2023-07-08 DIAGNOSIS — E538 Deficiency of other specified B group vitamins: Secondary | ICD-10-CM | POA: Diagnosis not present

## 2023-07-08 DIAGNOSIS — M25511 Pain in right shoulder: Secondary | ICD-10-CM

## 2023-07-08 DIAGNOSIS — M6281 Muscle weakness (generalized): Secondary | ICD-10-CM

## 2023-07-08 DIAGNOSIS — M62838 Other muscle spasm: Secondary | ICD-10-CM

## 2023-07-08 DIAGNOSIS — R293 Abnormal posture: Secondary | ICD-10-CM

## 2023-07-08 MED ORDER — CYANOCOBALAMIN 1000 MCG/ML IJ SOLN
1000.0000 ug | Freq: Once | INTRAMUSCULAR | Status: AC
Start: 2023-07-08 — End: 2023-07-08
  Administered 2023-07-08: 1000 ug via INTRAMUSCULAR

## 2023-07-08 NOTE — Progress Notes (Signed)
Robin Schmidt is a 57 y.o. female presents to the office today for B12 injections# 4 of 4 weekly per physician's orders. Original order: Per Alfredia Ferguson PA on lab resutls 06/16/23. Cyanocobalamin (med), 1000mg /ml (dose),  IM (route) was administered Left arm (location) today. Patient tolerated injection. She will have B12 check next week 07/15/23. Patient next injection due: in one month, appt made Yes For 08/05/23.  [name]

## 2023-07-08 NOTE — Therapy (Signed)
OUTPATIENT PHYSICAL THERAPY TREATMENT    Patient Name: Robin Schmidt MRN: [id number] DOB:09-18-1966, 57 y.o., female Today's Date: 07/08/2023    END OF SESSION:  PT End of Session - 07/08/23 1018     Visit Number 16    Date for PT Re-Evaluation 07/15/23    Authorization Type [employer]    Authorization Time Period 06/14/23 - 07/15/23    Authorization - Visit Number 5    Authorization - Number of Visits 8    Progress Note Due on Visit 16    PT Start Time 1018    PT Stop Time 1112    PT Time Calculation (min) 54 min    Activity Tolerance Patient tolerated treatment well    Behavior During Therapy [facility] for tasks assessed/performed                     Past Medical History:  Diagnosis Date   Anxiety    Back pain, chronic    Depression    Past Surgical History:  Procedure Laterality Date   ANTERIOR CERVICAL DECOMP/DISCECTOMY FUSION N/A 11/22/2013   Procedure: ANTERIOR CERVICAL DECOMPRESSION/DISCECTOMY FUSION 1 LEVEL;  Surgeon: Emilee Hero, MD;  Location: [facility] OR;  Service: Orthopedics;  Laterality: N/A;  Anterior cervical decompression fusion, cervical 5-6 with instrumentation and allograft   back fusion     lumbar   BACK SURGERY     KNEE ARTHROSCOPY     LAPAROSCOPIC GASTRIC SLEEVE RESECTION     2015   ROTATOR CUFF REPAIR Right 12/2022   TUBAL LIGATION     Patient Active Problem List   Diagnosis Date Noted   Full thickness rotator cuff tear 11/19/2022   Tinnitus, right 07/08/2017   Carpal tunnel syndrome 07/17/2014   Obstructive sleep apnea 07/17/2014   Radiculopathy 11/22/2013   Menometrorrhagia 06/08/2012    Class: Chronic   Chronic lower back pain 04/01/2007    PCP: Alfredia Ferguson, PA-C   REFERRING PROVIDER: Beverely Low, MD  REFERRING DIAG: [phone] (ICD-10-CM) - Complete rotator cuff tear or rupture of right shoulder, not specified as traumatic   THERAPY DIAG:  Acute pain of right shoulder  Stiffness of right shoulder, not  elsewhere classified  Abnormal posture  Muscle weakness (generalized)  Other muscle spasm  RATIONALE FOR EVALUATION AND TREATMENT: Rehabilitation  ONSET DATE:  12/14/22 - R Shoulder Scope/ Mini Open Rotator Cuff Repair    NEXT MD VISIT:  07/20/23 with Dr. Ranell Patrick (6 wks from 06/08/23 appt)   SUBJECTIVE:  SUBJECTIVE STATEMENT: Pt reports when she sleeps her R shoulder seems to lock up on her.  PAIN:  Are you having pain? Yes: NPRS scale: 7.5/10 Pain location: R upper/lateral shoulder  Pain description: sharp Aggravating factors: movement, sleeping on R side  Relieving factors: medication, warm shower, ice   PERTINENT HISTORY: Radiculopathy, Chronic back pain, Depression, OA of AC joint, L shoulder surgery - SAD/DCR 08/06/15  PRECAUTIONS: Shoulder 02/09/23: new orders received to continue and progress with behind the back, overhead, gentle PRE's with close elbow.  WEIGHT BEARING RESTRICTIONS: No  FALLS:  Has patient fallen in last 6 months? Yes. Number of falls 1 August fell in the tub due to LOB   LIVING ENVIRONMENT: Lives with: lives with their family Lives in: House/apartment Stairs: Yes: Internal: 14 steps; on right going up and External: 1 steps; none Has following equipment at home: Single point cane-does not have right now but will get it soon  OCCUPATION: Currently not working   PLOF: Independent with basic ADLs  LEISURE ACTIVITIES: walking and exercising before back and shoulder issues   PATIENT GOALS: Get ROM back, decrease overall pain    OBJECTIVE:   DIAGNOSTIC FINDINGS:  06/06/23 - R shoulder MRI scheduled  11/19/22 - R Shoulder MRI: MRI scan reviewed in detail with patient showing a high-grade partial-thickness versus full-thickness supraspinatus rotator cuff tear. No muscular  atrophy. There is some degenerative change in the superior labrum with questionable tear. Biceps located in the biceps groove. Subscap okay. Articular cartilage looks normal. AC joint with arthritis and slight outlet impingement.   PATIENT SURVEYS :  QuickDASH: 65.9% (04/15/23)  COGNITION: Overall cognitive status: Within functional limits for tasks assessed     SENSATION: N/T down the R arm sometimes, depends on movement, could also be due to wrist injury from 2 years ago  POSTURE: Rounded shoulders, forward head   UPPER EXTREMITY ROM: L WNL   Active ROM Left 03/02/23 Right 03/02/23 Right 04/15/23 Right  05/18/23 supine Right 06/03/23 seated Right 06/24/23 seated  Shoulder flexion 150 82 96 131 115 121  Shoulder extension 55 32   40 46  Shoulder abduction 145 63 84 125- pain 106 108  Shoulder adduction        Shoulder internal rotation 74 46 To L5  FIR R lateral buttock FIR R QL level with L3  Shoulder external rotation 72 39 To T2 40 FER C7 55 FER T1  Elbow flexion        Elbow extension        Wrist flexion        Wrist extension        Wrist ulnar deviation        Wrist radial deviation        Wrist pronation        Wrist supination         Passive ROM Right eval Right 03/02/23 Right 04/15/23 Right 06/03/23  Shoulder flexion 20*  120 118 135  Shoulder extension      Shoulder abduction  76 112 125  Shoulder adduction      Shoulder internal rotation 39* 48 25 55  Shoulder external rotation 18* 45 35 56  Elbow flexion 70*     Elbow extension 8*     Wrist flexion      Wrist extension      Wrist ulnar deviation      Wrist radial deviation      Wrist pronation 77*  Wrist supination 60*     (Blank rows = not tested)  UPPER EXTREMITY MMT: could not test R due to protocol  MMT Left eval Right 03/02/23 Right 04/15/23 Left 06/03/23 Right 06/03/23  Shoulder flexion 4 2+ 3 4 4-  Shoulder extension 5 3- 3 5 4+  Shoulder abduction 4+ 2 3- 5 4-  Shoulder adduction       Shoulder internal  rotation 5 3 3+ 5 4  Shoulder external rotation 4+ 2+ 3- 4+ 4-  Middle trapezius       Lower trapezius       Elbow flexion 5   5 4+  Elbow extension 5   5 4-  Wrist flexion       Wrist extension       Wrist ulnar deviation       Wrist radial deviation       Wrist pronation 5      Wrist supination 5      Grip strength (lbs) 40 lbs       (Blank rows = not tested)  JOINT MOBILITY TESTING:  NT due to protocol    TODAY'S TREATMENT:                                                                                                                                         DATE:   07/08/23 THERAPEUTIC EXERCISE: to improve flexibility, strength and mobility.  Verbal and tactile cues throughout for technique. UBE: L3.0 x 6 min (3' each fwd and back) Thoracic extension mobilization with hands behind head for pec stretch over FR 10 x 3-5" Hooklying on pool noodle - OH "Y" with looped RTB at wrists x 10 Snow angel stretch hooklying over pool noodle x 10 B shoulder protraction/retraction hooklying over pool noodle 3# in each hand x 10 S/L R shoulder flexion/scaption focusing on scapular engagement x 10  MANUAL THERAPY: To promote normalized muscle tension, improved flexibility, improved joint mobility, increased ROM, and reduced pain.  Supine R shoulder inferior and AP GH glides to stretch inferior and posterior capsule, 3 bouts 60 sec each. AAROM stretching between the jt mobilizations. Scapular mobs followed by scapular MWM during AROM shoulder flexion and scaption ROM   MODALITIES: (not part of treatment session) Moist heat pack to R shoulder x 10 to reduce post-exercise/MT soreness and inflammation   07/06/23: Manual: Supine for inferior and AP glides R shoulder  to stretch inferior and posterior capsule, 3 bouts 60 sec each. AAROM stretching between the jt mobilizations. Trigger Point Dry-Needling  Treatment instructions: Expect mild to moderate muscle soreness. S/S of pneumothorax if dry  needled over a lung field, and to seek immediate medical attention should they occur. Patient verbalized understanding of these instructions and education. Patient Consent Given: Yes Education handout provided: No Muscles treated: R pectorals Treatment response/outcome: Twitch Response Elicited and Palpable Increase in Muscle Length  Therex:  UBE 4 min, 2 F, 2 B Instructed in theraband depression R scapula(pt standing on band), while performing wall slides to reduce R shoulder shrugging Also utilized theraband for depression R scapula while performing the y slides on wall with lift off , with improved movement/mechanics noted. Seated rows, hands vertical to engage scapular retractors  Moist heat R shoulder post visit in sitting, not part of Rx session   07/01/23 THERAPEUTIC EXERCISE: to improve flexibility, strength and mobility.  Verbal and tactile cues throughout for technique. UBE: L3.0 x 6 min (3' each fwd and back) Serratus wall slides with arms in pillowcase pressing out against edges of pillowcase 2 x 10 - PT initially facilitating scapular retraction & depression - cues to keep pillowcase taut keeping shoulder ER and horiz adducted Lower trap setting with Y slide up wall + slight lift off at top of motion 2 x 10 - 1 set each before and after DN - less substitution with more symmetrical motion after DN  MANUAL THERAPY: To promote normalized muscle tension, improved flexibility, improved joint mobility, increased ROM, and reduced pain. Skilled palpation and monitoring of soft tissue during DN Trigger Point Dry-Needling  Treatment instructions: Expect mild to moderate muscle soreness. S/S of pneumothorax if dry needled over a lung field, and to seek immediate medical attention should they occur. Patient verbalized understanding of these instructions and education. Patient Consent Given: Yes Education handout provided: Previously provided Muscles treated: R lats, teres group,  infraspinatus, ant/mid/post deltoid, UT and LS Electrical stimulation performed: No Parameters: N/A Treatment response/outcome: Twitch Response Elicited and Palpable Increase in Muscle Length STM/DTM, manual TPR and pin & stretch to muscles addressed with DN R shoulder PROM/gentle stretching   MODALITIES:  Moist heat pack to R shoulder x 10 to reduce post-exercise/MT soreness and inflammation   PATIENT EDUCATION: Education details: postural awareness  Person educated: Patient Education method: Explanation Education comprehension: verbalized understanding  HOME EXERCISE PROGRAM: Access Code: [email or web address] URL: [email or web address] Date: 07/01/2023 Prepared by: Glenetta Hew  Exercises - Seated Scapular Retraction  - 2 x daily - 7 x weekly - 2 sets - 10 reps - 3-5 seconds hold - Seated Elbow Flexion and Extension AROM  - 2 x daily - 7 x weekly - 2 sets - 10 reps - Seated Shoulder Flexion Towel Slide at Table  - 2 x daily - 7 x weekly - 2 sets - 10 reps - Isometric Shoulder External Rotation  - 1 x daily - 7 x weekly - 3 sets - 5 reps - 5 sec hold - Isometric Shoulder Internal Rotation  - 1 x daily - 7 x weekly - 3 sets - 5 reps - 5 sec hold - Isometric Shoulder Flexion  - 1 x daily - 7 x weekly - 3 sets - 5 reps - 5 sec hold - Isometric Shoulder Abduction  - 1 x daily - 7 x weekly - 3 sets - 5 reps - 5 sec hold - Seated Shoulder Flexion AAROM with Pulley Behind (Mirrored)  - 1 x daily - 7 x weekly - 2 sets - 10 reps - 3 sec hold - Seated Shoulder Abduction AAROM with Pulley Behind (Mirrored)  - 1 x daily - 7 x weekly - 2 sets - 10 reps - 3 sec hold - Seated Shoulder External Rotation AAROM with Cane and Hand in Neutral  - 1 x daily - 7 x weekly - 2 sets - 10 reps - Supine Shoulder Flexion Extension AAROM with Dowel  -  1 x daily - 7 x weekly - 2 sets - 10 reps - 3 sec hold - Supine Shoulder External Rotation in 45 Degrees Abduction AAROM with Dowel  - 1 x daily - 7 x weekly -  2 sets - 10 reps - 3 sec hold - Supine Shoulder Protraction with Dowel  - 1 x daily - 7 x weekly - 2 sets - 10 reps - 3 sec hold - Standing Shoulder Abduction ROM with Dowel  - 1 x daily - 7 x weekly - 2 sets - 10 reps - 3 sec hold - Standing Bilateral Low Shoulder Row with Anchored Resistance  - 1 x daily - 3 x weekly - 2 sets - 10 reps - 5 sec hold - Scapular Retraction with Resistance Advanced  - 1 x daily - 3 x weekly - 2 sets - 10 reps - 5 sec hold - Shoulder External Rotation with Anchored Resistance  - 1 x daily - 3 x weekly - 2 sets - 10 reps - Shoulder Internal Rotation with Resistance  - 1 x daily - 3 x weekly - 2 sets - 10 reps - Standing Shoulder External Rotation with Resistance  - 1 x daily - 3 x weekly - 2 sets - 10 reps - 3 sec hold - Prone Scapular Retraction and Row (Mirrored)  - 1 x daily - 7 x weekly - 2 sets - 10 reps - 3 sec hold - Prone Shoulder Extension - Single Arm  - 1 x daily - 7 x weekly - 2 sets - 10 reps - 3 sec hold - Prone Single Arm Shoulder Horizontal Abduction with Scapular Retraction and Palm Down (Mirrored)  - 1 x daily - 7 x weekly - 2 sets - 10 reps - 3 sec hold - Sidelying Shoulder External Rotation (Mirrored)  - 1 x daily - 7 x weekly - 2 sets - 10 reps - 3 sec hold - Sidelying Shoulder Abduction Palm Forward  - 1 x daily - 7 x weekly - 2 sets - 10 reps - 3 sec hold - Sidelying Shoulder Flexion 15 Degrees (Mirrored)  - 1 x daily - 7 x weekly - 2 sets - 10 reps - 3 sec hold - Supine Shoulder Flexion Extension Full Range AROM  - 1 x daily - 7 x weekly - 2 sets - 10 reps - Standing Tricep Extensions with Resistance  - 1 x daily - 3 x weekly - 2 sets - 10 reps - 3 sec hold - Standing Overhead Shoulder External Rotation Stretch with Towel (Mirrored)  - 1 x daily - 7 x weekly - 10 reps - 3 sec hold - Standing Shoulder Internal Rotation Stretch with Towel  - 1 x daily - 7 x weekly - 10 reps - 3 sec hold - Serratus Activation at Wall  - 1 x daily - 3 x weekly - 2  sets - 10 reps - 3 sec hold - Wall Clock with Theraband  - 1 x daily - 3 x weekly - 2 sets - 10 reps - 3 sec hold   ASSESSMENT:  CLINICAL IMPRESSION:  Evan continues to struggle with proper R GH mechanics with R shoulder elevation with persistent c/o moderate to severe R shoulder pain. She is able to demonstrate improved mechanics following MT and/or with manual facilitation and is aware of what she should be focusing on going forward. She is nearing the end of her current POC and should be able to transition to  her HEP at that time, therefore will focus on preparing her to transition to the HEP in her remaining visits.   OBJECTIVE IMPAIRMENTS: decreased activity tolerance, decreased endurance, decreased knowledge of use of DME, decreased mobility, decreased ROM, decreased strength, increased edema, increased fascial restrictions, impaired perceived functional ability, increased muscle spasms, impaired flexibility, impaired sensation, impaired UE functional use, improper body mechanics, postural dysfunction, and pain.   ACTIVITY LIMITATIONS: carrying, lifting, bending, sitting, standing, sleeping, bed mobility, bathing, toileting, dressing, reach over head, hygiene/grooming, locomotion level, and caring for others  PARTICIPATION LIMITATIONS: meal prep, cleaning, laundry, driving, shopping, community activity, occupation, and yard work  PERSONAL FACTORS: Past/current experiences and 3+ comorbidities: Radiculopathy, Chronic back pain, Depression, OA of AC joint, L shoulder surgery - SAD/DCR   are also affecting patient's functional outcome.   REHAB POTENTIAL: Good  CLINICAL DECISION MAKING: Stable/uncomplicated  EVALUATION COMPLEXITY: Low   GOALS: Goals reviewed with patient? Yes  SHORT TERM GOALS: Target date: 01/28/2023, extended to 03/30/2023   Pt will be independent with original HEP. Baseline: Goal status: MET  03/02/23  2.  Pt will verbalize a decrease in pain by 25% to increase  her QOL and increase her tolerance for exercise and ROM. Baseline:  Goal status: MET  03/02/23 - patient reports consistent increased levels of pain; 04/15/23 - rates 7.5/10 which is higher pain level; 06/01/23 - pain 8/10 in recent visits; 06/03/23 - pt reports 20% improvement in R shoulder pain; 06/17/23 - 60% improvement  LONG TERM GOALS: Target date: extended to 07/15/23   Pt will demonstrate independence with ongoing HEP. Baseline:  Goal status: IN PROGRESS  06/24/23 - HEP reviewed & updated today   2.  Pt will report an improvement in score by 10 points on the QuickDASH to demonstrate an increase in function and decrease in pain.  Baseline: 65.9% (04/15/23)  Goal status: MET  04/15/23 - 65.9%; 06/03/23 - 54.5 %  3.  Pt will increase UE strength to grossly >/= 4/5 to address deficits seen above and improve overall function Baseline:  Goal status: IN PROGRESS  04/15/23 - R shoulder strength grossly 3 to 3+/5; 06/03/23 - R shoulder strength grossly 4-/5 to 4+/5 for available ROM   4.  Pt will increase R shoulder ROM to [facility] to address deficits seen above and improve overall function Baseline:  Goal status: IN PROGRESS  06/24/23 - refer to above UE ROM tables  5.  Patient to demonstrate improved upright posture with posterior shoulder girdle engaged to promote improved glenohumeral joint mobility Baseline:  Goal status: IN PROGRESS  03/02/23 - decreased scapular activation with increased shoulder hiking evident during shoulder elevation;  04/15/23 - poor mechanics, shrugging evident R shoulder; 06/01/23 & 06/24/23 - continued poor mechanics with decreased awareness of alignment/movement patterns and evidence of substitution especially with shoulder shrug  6.  Pt will verbalize a decrease in pain of 50% to increase her QOL and increase her involvement in her community Baseline:  Goal status: MET  03/02/23 - patient reports consistent increased levels of pain 04/15/23 - pain same rating as last visit 6 weeks ago;  06/01/23 - pain 8/10 in recent visits; 06/03/23 - pt reports 20% improvement in R shoulder pain; 06/17/23 - 60% improvement   PLAN:  PT FREQUENCY: 2x/week  PT DURATION: 6 weeks  PLANNED INTERVENTIONS: Therapeutic exercises, Therapeutic activity, Neuromuscular re-education, Patient/Family education, Self Care, Joint mobilization, DME instructions, Dry Needling, Electrical stimulation, Cryotherapy, Moist heat, Taping, Vasopneumatic device, Ultrasound, Manual therapy, and Re-evaluation  PLAN FOR NEXT SESSION: HEP review/update/consolidation in prep for anticipated transition to HEP at end of current POC; continue manual capsular stretching, focused ex to address scapular mechanics and depression of R GH head, rhythmic stabilization, motor control training R shoulder.   Marry Guan, PT 07/08/2023, 12:36 PM

## 2023-07-13 ENCOUNTER — Other Ambulatory Visit: Payer: Self-pay

## 2023-07-13 ENCOUNTER — Ambulatory Visit: Payer: Medicare HMO | Attending: Orthopedic Surgery

## 2023-07-13 DIAGNOSIS — M62838 Other muscle spasm: Secondary | ICD-10-CM | POA: Insufficient documentation

## 2023-07-13 DIAGNOSIS — M6281 Muscle weakness (generalized): Secondary | ICD-10-CM | POA: Insufficient documentation

## 2023-07-13 DIAGNOSIS — R293 Abnormal posture: Secondary | ICD-10-CM | POA: Diagnosis present

## 2023-07-13 DIAGNOSIS — M25611 Stiffness of right shoulder, not elsewhere classified: Secondary | ICD-10-CM | POA: Diagnosis present

## 2023-07-13 DIAGNOSIS — M25511 Pain in right shoulder: Secondary | ICD-10-CM | POA: Insufficient documentation

## 2023-07-13 NOTE — Therapy (Signed)
OUTPATIENT PHYSICAL THERAPY TREATMENT    Patient Name: Robin Schmidt MRN: [id number] DOB:05/19/1966, 57 y.o., female Today's Date: 07/13/2023    END OF SESSION:  PT End of Session - 07/13/23 1021     Visit Number 17    Date for PT Re-Evaluation 07/15/23    Authorization Type [employer]    Authorization Time Period 06/14/23 - 07/15/23    PT Start Time 1018    PT Stop Time 1108    PT Time Calculation (min) 50 min    Activity Tolerance Patient tolerated treatment well    Behavior During Therapy [facility] for tasks assessed/performed                     Past Medical History:  Diagnosis Date   Anxiety    Back pain, chronic    Depression    Past Surgical History:  Procedure Laterality Date   ANTERIOR CERVICAL DECOMP/DISCECTOMY FUSION N/A 11/22/2013   Procedure: ANTERIOR CERVICAL DECOMPRESSION/DISCECTOMY FUSION 1 LEVEL;  Surgeon: Emilee Hero, MD;  Location: [facility] OR;  Service: Orthopedics;  Laterality: N/A;  Anterior cervical decompression fusion, cervical 5-6 with instrumentation and allograft   back fusion     lumbar   BACK SURGERY     KNEE ARTHROSCOPY     LAPAROSCOPIC GASTRIC SLEEVE RESECTION     2015   ROTATOR CUFF REPAIR Right 12/2022   TUBAL LIGATION     Patient Active Problem List   Diagnosis Date Noted   Full thickness rotator cuff tear 11/19/2022   Tinnitus, right 07/08/2017   Carpal tunnel syndrome 07/17/2014   Obstructive sleep apnea 07/17/2014   Radiculopathy 11/22/2013   Menometrorrhagia 06/08/2012    Class: Chronic   Chronic lower back pain 04/01/2007    PCP: Alfredia Ferguson, PA-C   REFERRING PROVIDER: Beverely Low, MD  REFERRING DIAG: [phone] (ICD-10-CM) - Complete rotator cuff tear or rupture of right shoulder, not specified as traumatic   THERAPY DIAG:  Acute pain of right shoulder  Abnormal posture  Stiffness of right shoulder, not elsewhere classified  Other muscle spasm  Muscle weakness (generalized)  RATIONALE FOR  EVALUATION AND TREATMENT: Rehabilitation  ONSET DATE:  12/14/22 - R Shoulder Scope/ Mini Open Rotator Cuff Repair    NEXT MD VISIT:  07/20/23 with Dr. Ranell Patrick (6 wks from 06/08/23 appt)   SUBJECTIVE:                                                                                                                                                                                      SUBJECTIVE STATEMENT: Pt fell this weekend, R LE gave away, tried to stop fall  with her arms but R arm not strong enough  PAIN:  Are you having pain? Yes: NPRS scale: 7.5/10 Pain location: R upper/lateral shoulder  Pain description: sharp Aggravating factors: movement, sleeping on R side  Relieving factors: medication, warm shower, ice   PERTINENT HISTORY: Radiculopathy, Chronic back pain, Depression, OA of AC joint, L shoulder surgery - SAD/DCR 08/06/15  PRECAUTIONS: Shoulder 02/09/23: new orders received to continue and progress with behind the back, overhead, gentle PRE's with close elbow.  WEIGHT BEARING RESTRICTIONS: No  FALLS:  Has patient fallen in last 6 months? Yes. Number of falls 1 August fell in the tub due to LOB   LIVING ENVIRONMENT: Lives with: lives with their family Lives in: House/apartment Stairs: Yes: Internal: 14 steps; on right going up and External: 1 steps; none Has following equipment at home: Single point cane-does not have right now but will get it soon  OCCUPATION: Currently not working   PLOF: Independent with basic ADLs  LEISURE ACTIVITIES: walking and exercising before back and shoulder issues   PATIENT GOALS: Get ROM back, decrease overall pain    OBJECTIVE:   DIAGNOSTIC FINDINGS:  06/06/23 - R shoulder MRI scheduled  11/19/22 - R Shoulder MRI: MRI scan reviewed in detail with patient showing a high-grade partial-thickness versus full-thickness supraspinatus rotator cuff tear. No muscular atrophy. There is some degenerative change in the superior labrum with questionable  tear. Biceps located in the biceps groove. Subscap okay. Articular cartilage looks normal. AC joint with arthritis and slight outlet impingement.   PATIENT SURVEYS :  QuickDASH: 65.9% (04/15/23)  COGNITION: Overall cognitive status: Within functional limits for tasks assessed     SENSATION: N/T down the R arm sometimes, depends on movement, could also be due to wrist injury from 2 years ago  POSTURE: Rounded shoulders, forward head   UPPER EXTREMITY ROM: L WNL   Active ROM Left 03/02/23 Right 03/02/23 Right 04/15/23 Right  05/18/23 supine Right 06/03/23 seated Right 06/24/23 seated  Shoulder flexion 150 82 96 131 115 121  Shoulder extension 55 32   40 46  Shoulder abduction 145 63 84 125- pain 106 108  Shoulder adduction        Shoulder internal rotation 74 46 To L5  FIR R lateral buttock FIR R QL level with L3  Shoulder external rotation 72 39 To T2 40 FER C7 55 FER T1  Elbow flexion        Elbow extension        Wrist flexion        Wrist extension        Wrist ulnar deviation        Wrist radial deviation        Wrist pronation        Wrist supination         Passive ROM Right eval Right 03/02/23 Right 04/15/23 Right 06/03/23  Shoulder flexion 20*  120 118 135  Shoulder extension      Shoulder abduction  76 112 125  Shoulder adduction      Shoulder internal rotation 39* 48 25 55  Shoulder external rotation 18* 45 35 56  Elbow flexion 70*     Elbow extension 8*     Wrist flexion      Wrist extension      Wrist ulnar deviation      Wrist radial deviation      Wrist pronation 77*     Wrist supination 60*     (  Blank rows = not tested)  UPPER EXTREMITY MMT: could not test R due to protocol  MMT Left eval Right 03/02/23 Right 04/15/23 Left 06/03/23 Right 06/03/23  Shoulder flexion 4 2+ 3 4 4-  Shoulder extension 5 3- 3 5 4+  Shoulder abduction 4+ 2 3- 5 4-  Shoulder adduction       Shoulder internal rotation 5 3 3+ 5 4  Shoulder external rotation 4+ 2+ 3- 4+ 4-  Middle trapezius        Lower trapezius       Elbow flexion 5   5 4+  Elbow extension 5   5 4-  Wrist flexion       Wrist extension       Wrist ulnar deviation       Wrist radial deviation       Wrist pronation 5      Wrist supination 5      Grip strength (lbs) 40 lbs       (Blank rows = not tested)  JOINT MOBILITY TESTING:  NT due to protocol    TODAY'S TREATMENT:                                                                                                                                         DATE:  07/13/23: Manual: Supine for inferior glides R shoulder, with shoulder at 90 degrees abduction, to increase post/ inf capsular mobility Also supine for distraction of R humerus with lateral glides R scapula against trunk to stretch inferior capsule Trigger Point Dry-Needling  Treatment instructions: Expect mild to moderate muscle soreness. S/S of pneumothorax if dry needled over a lung field, and to seek immediate medical attention should they occur. Patient verbalized understanding of these instructions and education. Patient Consent Given: Yes Education handout provided: Previously provided Muscles treated: R lateral deltoids, R pecs, R biceps, R infraspinatus  Treatment response/outcome: Twitch Response Elicited and Palpable Increase in Muscle Length   Therapeutic exercise: Seated rows, hands vertical, 20#, 2 set 15 reps Seated rows, hands horizontal 20# one set 15 reps Paloff press, attempted with green t band, pt with obvious poor proximal control of humeral head, so lowered resistance to red, with improved control noted.    Moist heat R shoulder at end of session, 10 min, not included in rx charge 07/08/23 THERAPEUTIC EXERCISE: to improve flexibility, strength and mobility.  Verbal and tactile cues throughout for technique. UBE: L3.0 x 6 min (3' each fwd and back) Thoracic extension mobilization with hands behind head for pec stretch over FR 10 x 3-5" Hooklying on pool noodle - OH "Y" with  looped RTB at wrists x 10 Snow angel stretch hooklying over pool noodle x 10 B shoulder protraction/retraction hooklying over pool noodle 3# in each hand x 10 S/L R shoulder flexion/scaption focusing on scapular engagement x 10  MANUAL THERAPY: To promote normalized muscle  tension, improved flexibility, improved joint mobility, increased ROM, and reduced pain.  Supine R shoulder inferior and AP GH glides to stretch inferior and posterior capsule, 3 bouts 60 sec each. AAROM stretching between the jt mobilizations. Scapular mobs followed by scapular MWM during AROM shoulder flexion and scaption ROM   MODALITIES: (not part of treatment session) Moist heat pack to R shoulder x 10 to reduce post-exercise/MT soreness and inflammation   07/06/23: Manual: Supine for inferior and AP glides R shoulder  to stretch inferior and posterior capsule, 3 bouts 60 sec each. AAROM stretching between the jt mobilizations. Trigger Point Dry-Needling  Treatment instructions: Expect mild to moderate muscle soreness. S/S of pneumothorax if dry needled over a lung field, and to seek immediate medical attention should they occur. Patient verbalized understanding of these instructions and education. Patient Consent Given: Yes Education handout provided: No Muscles treated: R pectorals Treatment response/outcome: Twitch Response Elicited and Palpable Increase in Muscle Length   Therex:  UBE 4 min, 2 F, 2 B Instructed in theraband depression R scapula(pt standing on band), while performing wall slides to reduce R shoulder shrugging Also utilized theraband for depression R scapula while performing the y slides on wall with lift off , with improved movement/mechanics noted. Seated rows, hands vertical to engage scapular retractors  Moist heat R shoulder post visit in sitting, not part of Rx session   07/01/23 THERAPEUTIC EXERCISE: to improve flexibility, strength and mobility.  Verbal and tactile cues throughout for  technique. UBE: L3.0 x 6 min (3' each fwd and back) Serratus wall slides with arms in pillowcase pressing out against edges of pillowcase 2 x 10 - PT initially facilitating scapular retraction & depression - cues to keep pillowcase taut keeping shoulder ER and horiz adducted Lower trap setting with Y slide up wall + slight lift off at top of motion 2 x 10 - 1 set each before and after DN - less substitution with more symmetrical motion after DN  MANUAL THERAPY: To promote normalized muscle tension, improved flexibility, improved joint mobility, increased ROM, and reduced pain. Skilled palpation and monitoring of soft tissue during DN Trigger Point Dry-Needling  Treatment instructions: Expect mild to moderate muscle soreness. S/S of pneumothorax if dry needled over a lung field, and to seek immediate medical attention should they occur. Patient verbalized understanding of these instructions and education. Patient Consent Given: Yes Education handout provided: Previously provided Muscles treated: R lats, teres group, infraspinatus, ant/mid/post deltoid, UT and LS Electrical stimulation performed: No Parameters: N/A Treatment response/outcome: Twitch Response Elicited and Palpable Increase in Muscle Length STM/DTM, manual TPR and pin & stretch to muscles addressed with DN R shoulder PROM/gentle stretching   MODALITIES:  Moist heat pack to R shoulder x 10 to reduce post-exercise/MT soreness and inflammation   PATIENT EDUCATION: Education details: postural awareness  Person educated: Patient Education method: Explanation Education comprehension: verbalized understanding  HOME EXERCISE PROGRAM: Access Code: [email or web address] URL: [email or web address] Date: 07/01/2023 Prepared by: Glenetta Hew  Exercises - Seated Scapular Retraction  - 2 x daily - 7 x weekly - 2 sets - 10 reps - 3-5 seconds hold - Seated Elbow Flexion and Extension AROM  - 2 x daily - 7 x weekly - 2 sets - 10  reps - Seated Shoulder Flexion Towel Slide at Table  - 2 x daily - 7 x weekly - 2 sets - 10 reps - Isometric Shoulder External Rotation  - 1 x daily - 7 x weekly - 3 sets -  5 reps - 5 sec hold - Isometric Shoulder Internal Rotation  - 1 x daily - 7 x weekly - 3 sets - 5 reps - 5 sec hold - Isometric Shoulder Flexion  - 1 x daily - 7 x weekly - 3 sets - 5 reps - 5 sec hold - Isometric Shoulder Abduction  - 1 x daily - 7 x weekly - 3 sets - 5 reps - 5 sec hold - Seated Shoulder Flexion AAROM with Pulley Behind (Mirrored)  - 1 x daily - 7 x weekly - 2 sets - 10 reps - 3 sec hold - Seated Shoulder Abduction AAROM with Pulley Behind (Mirrored)  - 1 x daily - 7 x weekly - 2 sets - 10 reps - 3 sec hold - Seated Shoulder External Rotation AAROM with Cane and Hand in Neutral  - 1 x daily - 7 x weekly - 2 sets - 10 reps - Supine Shoulder Flexion Extension AAROM with Dowel  - 1 x daily - 7 x weekly - 2 sets - 10 reps - 3 sec hold - Supine Shoulder External Rotation in 45 Degrees Abduction AAROM with Dowel  - 1 x daily - 7 x weekly - 2 sets - 10 reps - 3 sec hold - Supine Shoulder Protraction with Dowel  - 1 x daily - 7 x weekly - 2 sets - 10 reps - 3 sec hold - Standing Shoulder Abduction ROM with Dowel  - 1 x daily - 7 x weekly - 2 sets - 10 reps - 3 sec hold - Standing Bilateral Low Shoulder Row with Anchored Resistance  - 1 x daily - 3 x weekly - 2 sets - 10 reps - 5 sec hold - Scapular Retraction with Resistance Advanced  - 1 x daily - 3 x weekly - 2 sets - 10 reps - 5 sec hold - Shoulder External Rotation with Anchored Resistance  - 1 x daily - 3 x weekly - 2 sets - 10 reps - Shoulder Internal Rotation with Resistance  - 1 x daily - 3 x weekly - 2 sets - 10 reps - Standing Shoulder External Rotation with Resistance  - 1 x daily - 3 x weekly - 2 sets - 10 reps - 3 sec hold - Prone Scapular Retraction and Row (Mirrored)  - 1 x daily - 7 x weekly - 2 sets - 10 reps - 3 sec hold - Prone Shoulder Extension -  Single Arm  - 1 x daily - 7 x weekly - 2 sets - 10 reps - 3 sec hold - Prone Single Arm Shoulder Horizontal Abduction with Scapular Retraction and Palm Down (Mirrored)  - 1 x daily - 7 x weekly - 2 sets - 10 reps - 3 sec hold - Sidelying Shoulder External Rotation (Mirrored)  - 1 x daily - 7 x weekly - 2 sets - 10 reps - 3 sec hold - Sidelying Shoulder Abduction Palm Forward  - 1 x daily - 7 x weekly - 2 sets - 10 reps - 3 sec hold - Sidelying Shoulder Flexion 15 Degrees (Mirrored)  - 1 x daily - 7 x weekly - 2 sets - 10 reps - 3 sec hold - Supine Shoulder Flexion Extension Full Range AROM  - 1 x daily - 7 x weekly - 2 sets - 10 reps - Standing Tricep Extensions with Resistance  - 1 x daily - 3 x weekly - 2 sets - 10 reps - 3 sec hold -  Standing Overhead Shoulder External Rotation Stretch with Towel (Mirrored)  - 1 x daily - 7 x weekly - 10 reps - 3 sec hold - Standing Shoulder Internal Rotation Stretch with Towel  - 1 x daily - 7 x weekly - 10 reps - 3 sec hold - Serratus Activation at Wall  - 1 x daily - 3 x weekly - 2 sets - 10 reps - 3 sec hold - Wall Clock with Theraband  - 1 x daily - 3 x weekly - 2 sets - 10 reps - 3 sec hold   ASSESSMENT:  CLINICAL IMPRESSION:  Shalese continues to struggle with proper R GH mechanics with R shoulder elevation with persistent c/o moderate to severe R shoulder pain. Poor proximal control noted today with paloff press.  She is aware that next visit will be the final one.  Has comprehensive home program.    OBJECTIVE IMPAIRMENTS: decreased activity tolerance, decreased endurance, decreased knowledge of use of DME, decreased mobility, decreased ROM, decreased strength, increased edema, increased fascial restrictions, impaired perceived functional ability, increased muscle spasms, impaired flexibility, impaired sensation, impaired UE functional use, improper body mechanics, postural dysfunction, and pain.   ACTIVITY LIMITATIONS: carrying, lifting, bending,  sitting, standing, sleeping, bed mobility, bathing, toileting, dressing, reach over head, hygiene/grooming, locomotion level, and caring for others  PARTICIPATION LIMITATIONS: meal prep, cleaning, laundry, driving, shopping, community activity, occupation, and yard work  PERSONAL FACTORS: Past/current experiences and 3+ comorbidities: Radiculopathy, Chronic back pain, Depression, OA of AC joint, L shoulder surgery - SAD/DCR   are also affecting patient's functional outcome.   REHAB POTENTIAL: Good  CLINICAL DECISION MAKING: Stable/uncomplicated  EVALUATION COMPLEXITY: Low   GOALS: Goals reviewed with patient? Yes  SHORT TERM GOALS: Target date: 01/28/2023, extended to 03/30/2023   Pt will be independent with original HEP. Baseline: Goal status: MET  03/02/23  2.  Pt will verbalize a decrease in pain by 25% to increase her QOL and increase her tolerance for exercise and ROM. Baseline:  Goal status: MET  03/02/23 - patient reports consistent increased levels of pain; 04/15/23 - rates 7.5/10 which is higher pain level; 06/01/23 - pain 8/10 in recent visits; 06/03/23 - pt reports 20% improvement in R shoulder pain; 06/17/23 - 60% improvement  LONG TERM GOALS: Target date: extended to 07/15/23   Pt will demonstrate independence with ongoing HEP. Baseline:  Goal status: IN PROGRESS  06/24/23 - HEP reviewed & updated today   2.  Pt will report an improvement in score by 10 points on the QuickDASH to demonstrate an increase in function and decrease in pain.  Baseline: 65.9% (04/15/23)  Goal status: MET  04/15/23 - 65.9%; 06/03/23 - 54.5 %  3.  Pt will increase UE strength to grossly >/= 4/5 to address deficits seen above and improve overall function Baseline:  Goal status: IN PROGRESS  04/15/23 - R shoulder strength grossly 3 to 3+/5; 06/03/23 - R shoulder strength grossly 4-/5 to 4+/5 for available ROM   4.  Pt will increase R shoulder ROM to [facility] to address deficits seen above and improve overall  function Baseline:  Goal status: IN PROGRESS  06/24/23 - refer to above UE ROM tables  5.  Patient to demonstrate improved upright posture with posterior shoulder girdle engaged to promote improved glenohumeral joint mobility Baseline:  Goal status: IN PROGRESS  03/02/23 - decreased scapular activation with increased shoulder hiking evident during shoulder elevation;  04/15/23 - poor mechanics, shrugging evident R shoulder; 06/01/23 & 06/24/23 -  continued poor mechanics with decreased awareness of alignment/movement patterns and evidence of substitution especially with shoulder shrug  6.  Pt will verbalize a decrease in pain of 50% to increase her QOL and increase her involvement in her community Baseline:  Goal status: MET  03/02/23 - patient reports consistent increased levels of pain 04/15/23 - pain same rating as last visit 6 weeks ago; 06/01/23 - pain 8/10 in recent visits; 06/03/23 - pt reports 20% improvement in R shoulder pain; 06/17/23 - 60% improvement   PLAN:  PT FREQUENCY: 2x/week  PT DURATION: 6 weeks  PLANNED INTERVENTIONS: Therapeutic exercises, Therapeutic activity, Neuromuscular re-education, Patient/Family education, Self Care, Joint mobilization, DME instructions, Dry Needling, Electrical stimulation, Cryotherapy, Moist heat, Taping, Vasopneumatic device, Ultrasound, Manual therapy, and Re-evaluation  PLAN FOR NEXT SESSION: HEP review/update/consolidation in prep for anticipated transition to HEP at end of current POC; continue manual capsular stretching, focused ex to address scapular mechanics and depression of R GH head, rhythmic stabilization, motor control training R shoulder.   Anchor Dwan L Raihana Balderrama, PT 07/13/2023, 12:42 PM

## 2023-07-13 NOTE — Therapy (Deleted)
OUTPATIENT PHYSICAL THERAPY TREATMENT     Patient Name: Robin Schmidt MRN: [id number] DOB:21-May-1966, 57 y.o., female Today's Date: 07/13/2023  END OF SESSION:      Past Medical History:  Diagnosis Date   Anxiety    Back pain, chronic    Depression    Past Surgical History:  Procedure Laterality Date   ANTERIOR CERVICAL DECOMP/DISCECTOMY FUSION N/A 11/22/2013   Procedure: ANTERIOR CERVICAL DECOMPRESSION/DISCECTOMY FUSION 1 LEVEL;  Surgeon: Emilee Hero, MD;  Location: [facility] OR;  Service: Orthopedics;  Laterality: N/A;  Anterior cervical decompression fusion, cervical 5-6 with instrumentation and allograft   back fusion     lumbar   BACK SURGERY     KNEE ARTHROSCOPY     LAPAROSCOPIC GASTRIC SLEEVE RESECTION     2015   ROTATOR CUFF REPAIR Right 12/2022   TUBAL LIGATION     Patient Active Problem List   Diagnosis Date Noted   Full thickness rotator cuff tear 11/19/2022   Tinnitus, right 07/08/2017   Carpal tunnel syndrome 07/17/2014   Obstructive sleep apnea 07/17/2014   Radiculopathy 11/22/2013   Menometrorrhagia 06/08/2012    Class: Chronic   Chronic lower back pain 04/01/2007    PCP: Dahlia Bailiff, MD  REFERRING PROVIDER: Beverely Low, MD  REFERRING DIAG: [id number] (ICD-10-CM) - Complete rotator cuff tear or rupture of right shoulder, not specified as traumatic   THERAPY DIAG:  No diagnosis found.  RATIONALE FOR EVALUATION AND TREATMENT: Rehabilitation  ONSET DATE: 12/14/22 - R Shoulder Scope/ Mini Open Rotator Cuff Repair    NEXT MD VISIT: 03/05/23   SUBJECTIVE:                                                                                                                                                                                      SUBJECTIVE STATEMENT:  Pt reports challenges with full ROM of her R shoulder, it hurts to lay on it as well.  PAIN:  Are you having pain? Yes: NPRS scale:  8/10 Pain location: R upper/lateral shoulder  Pain  description: dull aching, tightness Aggravating factors: movement  Relieving factors: medication, warm shower, ice   PERTINENT HISTORY: Radiculopathy, Chronic back pain, Depression, OA of AC joint, L shoulder surgery - SAD/DCR 08/06/15  PRECAUTIONS: Shoulder 02/09/23: new orders received to continue and progress with behind the back, overhead, gentle PRE's with close elbow.  WEIGHT BEARING RESTRICTIONS: No  FALLS:  Has patient fallen in last 6 months? Yes. Number of falls 1 August fell in the tub due to LOB   LIVING ENVIRONMENT: Lives with: lives with their family Lives in: House/apartment Stairs: Yes: Internal: 14 steps; on right going  up and External: 1 steps; none Has following equipment at home: Single point cane-does not have right now but will get it soon  OCCUPATION: Currently not working   PLOF: Independent with basic ADLs  LEISURE ACTIVITIES: walking and exercising before back and shoulder issues   PATIENT GOALS: Get ROM back, decrease overall pain    OBJECTIVE:   DIAGNOSTIC FINDINGS:  11/19/22 R Shoulder MRI: MRI scan reviewed in detail with patient showing a high-grade partial-thickness versus full-thickness supraspinatus rotator cuff tear. No muscular atrophy. There is some degenerative change in the superior labrum with questionable tear. Biceps located in the biceps groove. Subscap okay. Articular cartilage looks normal. AC joint with arthritis and slight outlet impingement.   PATIENT SURVEYS :  QuickDASH: TBD at next visit   COGNITION: Overall cognitive status: Within functional limits for tasks assessed     SENSATION: N/T down the R arm sometimes, depends on movement, could also be due to wrist injury from 2 years ago  POSTURE: Rounded shoulders, forward head   UPPER EXTREMITY ROM: L WNL   Active ROM Left 03/02/23 Right 03/02/23 R  04/15/23 Right supine 05/18/23  Shoulder flexion 150 82 96 131  Shoulder extension 55 32 To L5   Shoulder abduction 145 63  84 125- pain  Shoulder adduction      Shoulder internal rotation 74 46    Shoulder external rotation 72 39 To T2 40  Elbow flexion      Elbow extension      Wrist flexion      Wrist extension      Wrist ulnar deviation      Wrist radial deviation      Wrist pronation      Wrist supination       Passive ROM Right eval Right 03/02/23 R 04/15/23  Shoulder flexion 20*  120 118  Shoulder extension     Shoulder abduction  76 112  Shoulder adduction     Shoulder internal rotation 39* 48 25  Shoulder external rotation 18* 45 35  Elbow flexion 70*    Elbow extension 8*    Wrist flexion     Wrist extension     Wrist ulnar deviation     Wrist radial deviation     Wrist pronation 77*    Wrist supination 60*    (Blank rows = not tested)  UPPER EXTREMITY MMT: could not test R due to protocol  MMT Left eval Right R 04/15/23  Shoulder flexion 4 2+ 3  Shoulder extension 5 3- 3  Shoulder abduction 4+ 2 3-  Shoulder adduction     Shoulder internal rotation 5 3 3+  Shoulder external rotation 4+ 2+ 3-  Middle trapezius     Lower trapezius     Elbow flexion 5    Elbow extension 5    Wrist flexion     Wrist extension     Wrist ulnar deviation     Wrist radial deviation     Wrist pronation 5    Wrist supination 5    Grip strength (lbs) 40 lbs     (Blank rows = not tested)  JOINT MOBILITY TESTING:  NT due to protocol    TODAY'S TREATMENT:  DATE:  05/18/23 THERAPEUTIC EXERCISE: to improve flexibility, strength and mobility.  Verbal and tactile cues throughout for technique. UBE: L1.0 x 6 min (3' each fwd and back) Measured R shoulder AROM in supine Supine R shoulder flexion with 2# DB 2x10 Supine R serratus punch with 2# DB x 20 Wall wash R shld flexion, then scaption x 10 Standing rows RTB x 10  Standing shoulder extension RTB x 10  Standing R  shoulder IR/ER with RTB x 10 each   04/22/23 THERAPEUTIC EXERCISE: to improve flexibility, strength and mobility.  Verbal and tactile cues throughout for technique. UBE: L1.0 x 6 min (3' each fwd and back) Prone R scap retraction + shoulder row x 10 Prone R scap retraction + shoulder extension x 10 Prone R scap retraction + shoulder horiz ABD x 10 S/L R scap retraction + shoulder ER 2 x 10 S/L R scap retraction + shoulder ABD 2 x 10 Hooklying R shoulder protraction at 90 flexion 2 x 10 - VC & TC cues for proper movement pattern Hooklying R shoulder CW/CCW circles at 90 flexion 2# x 10 each direction Hooklying B shoulder flexion with 1# bar x 10 - pt reporting painful pinching sensation in right shoulder at end ROM, only mildly reduced with PT providing MWM for scapular motion  MANUAL THERAPY: To promote normalized muscle tension, improved flexibility, improved joint mobility, increased ROM, and reduced pain. STM/DTM to R lateral deltoid MWM for R scapular retraction & depression with supine B shoulder flexion   MODALITIES: Game Ready vasopneumatic compression post session to R shoulder x 10 min, low compression, 34 deg to reduce post-exercise pain and swelling/edema   04/15/23: Reassessed today for updated plan of care:  Manual: Supine for inferior glides, R humeral head, 60 sec bouts, 3reps to improve capsular mobility Supine AAROM/ Passive stretching R shoulder all planes  Instructed in therex as described in medbridge app below: Shoulder pulley for 3 min at end of session with marked improvement in R shoulder elevation as compared to initial part of this session.   03/02/23 - Recert THERAPEUTIC EXERCISE: to improve flexibility, strength and mobility.  Verbal and tactile cues throughout for technique. Pulleys: Flexion and scaption x 3 min each S/L R scapular retraction and depression 10 x 3" S/L R scapular retraction + PT assisted AAROM shoulder ER 10 x 3" S/L R scapular  retraction + PT assisted AAROM shoulder flexion to 90 10 x 3" - pt having difficulty maintaining scapular engagement Supine wand AAROM R shoulder flexion with PT providing scapular stabilization against ribs x 10 Supine wand AAROM R shoulder protraction x 10 Supine wand AAROM R shoulder scaption/abduction with PT providing scapular stabilization against ribs x 10 Supine wand AAROM R shoulder scaption/abduction with VC & TC for scapular stabilization against ribs x 10, avoiding shoulder hike Standing scapular retraction + YTB shoulder rows 10 x 5" Standing scapular retraction + YTB shoulder extension to neutral 10 x 5"  MANUAL THERAPY: Supine R shoulder - To promote normalized muscle tension, improved flexibility, improved joint mobility, increased ROM, and reduced pain. Gentle GH oscillations to reduce muscle guarding and pain Grade II-III R glenohumeral distraction, inferior and AP glides STM/DTM, XFM and manual TPR to anterior and lateral deltoid  THERAPEUTIC ACTIVITIES: R shoulder ROM assessment Goal assessment    PATIENT EDUCATION: Education details: progress with PT, ongoing PT POC, HEP update, and postural awareness  Person educated: Patient Education method: Explanation, Demonstration, Tactile cues, Verbal cues, and Handouts Education comprehension:  verbalized understanding, returned demonstration, and needs further education  HOME EXERCISE PROGRAM: Access Code: [email or web address] URL: [email or web address] Date: 04/15/2023 Prepared by: Linton Rump Kayle Passarelli  Exercises - Supine Shoulder Flexion Extension AAROM with Dowel  - 2 x daily - 7 x weekly - 2 sets - 10 reps - Supine Shoulder External Rotation in 45 Degrees Abduction AAROM with Dowel  - 2 x daily - 7 x weekly - 2 sets - 10 reps - Standing Shoulder External Rotation with Resistance  - 1 x daily - 7 x weekly - 1 sets - 10 reps - 10 sec hold  Access Code: [email or web address] URL: [email or web address] Date:  05/18/2023 Prepared by: Verta Ellen  Exercises - Seated Scapular Retraction  - 2 x daily - 7 x weekly - 2 sets - 10 reps - 3-5 seconds hold - Seated Elbow Flexion and Extension AROM  - 2 x daily - 7 x weekly - 2 sets - 10 reps - Seated Shoulder Flexion Towel Slide at Table  - 2 x daily - 7 x weekly - 2 sets - 10 reps - Isometric Shoulder External Rotation  - 1 x daily - 7 x weekly - 3 sets - 5 reps - 5 sec hold - Isometric Shoulder Internal Rotation  - 1 x daily - 7 x weekly - 3 sets - 5 reps - 5 sec hold - Isometric Shoulder Flexion  - 1 x daily - 7 x weekly - 3 sets - 5 reps - 5 sec hold - Isometric Shoulder Abduction  - 1 x daily - 7 x weekly - 3 sets - 5 reps - 5 sec hold - Seated Shoulder External Rotation AAROM with Cane and Hand in Neutral  - 1 x daily - 7 x weekly - 2 sets - 10 reps - Prone Single Arm Shoulder Horizontal Abduction with Scapular Retraction and Palm Down  - 1 x daily - 7 x weekly - 2 sets - 10 reps - 3 sec hold - Seated Shoulder Flexion AAROM with Pulley Behind  - 1 x daily - 7 x weekly - 2 sets - 10 reps - 3 sec hold - Supine Shoulder Flexion AAROM with Dowel  - 1 x daily - 7 x weekly - 2 sets - 10 reps - 3 sec hold - Supine Shoulder Protraction with Dowel  - 1 x daily - 7 x weekly - 2 sets - 10 reps - 3 sec hold - Standing Shoulder Abduction ROM with Dowel  - 1 x daily - 7 x weekly - 2 sets - 10 reps - 3 sec hold - Standing Bilateral Low Shoulder Row with Anchored Resistance  - 1 x daily - 7 x weekly - 2 sets - 10 reps - 5 sec hold - Scapular Retraction with Resistance Advanced  - 1 x daily - 7 x weekly - 2 sets - 10 reps - 5 sec hold - Shoulder External Rotation with Anchored Resistance  - 1 x daily - 7 x weekly - 2 sets - 10 reps - Shoulder Internal Rotation with Resistance  - 1 x daily - 7 x weekly - 2 sets - 10 reps - Supine Shoulder Flexion Extension Full Range AROM  - 1 x daily - 7 x weekly - 2 sets - 10 reps  ASSESSMENT:  CLINICAL IMPRESSION:  Pt continues  to report high pain levels in R shoulder. AROM was measured today with her showing good ROM in supine but difficulty with raising her R arm in upright positions w/o assistance.  Progressed scap stability and RTC strengthening within tolerance. Cuing required to keep elbow in with ER and to keep 90 deg at elbow. Instruction given throughout session to avoid pain with exercise vs muscle burning associated with muscle fatigue.  OBJECTIVE IMPAIRMENTS: decreased activity tolerance, decreased endurance, decreased knowledge of use of DME, decreased mobility, decreased ROM, decreased strength, increased edema, increased fascial restrictions, impaired perceived functional ability, increased muscle spasms, impaired flexibility, impaired sensation, impaired UE functional use, improper body mechanics, postural dysfunction, and pain.   ACTIVITY LIMITATIONS: carrying, lifting, bending, sitting, standing, sleeping, bed mobility, bathing, toileting, dressing, reach over head, hygiene/grooming, locomotion level, and caring for others  PARTICIPATION LIMITATIONS: meal prep, cleaning, laundry, driving, shopping, community activity, occupation, and yard work  PERSONAL FACTORS: Past/current experiences and 3+ comorbidities: Radiculopathy, Chronic back pain, Depression, OA of AC joint, L shoulder surgery - SAD/DCR   are also affecting patient's functional outcome.   REHAB POTENTIAL: Good  CLINICAL DECISION MAKING: Stable/uncomplicated  EVALUATION COMPLEXITY: Low   GOALS: Goals reviewed with patient? Yes  SHORT TERM GOALS: Target date: 01/28/2023, extended to 03/30/2023   Pt will be independent with original HEP. Baseline: Goal status: MET  03/02/23  2.  Pt will verbalize a decrease in pain by 25% to increase her QOL and increase her tolerance for exercise and ROM. Baseline:  Goal status: IN PROGRESS  03/02/23 - patient reports consistent increased levels of pain 04/15/23; rates 7 1/2 /10 which is higher pain  level  LONG TERM GOALS: Target date: 03/04/2023, extended to 06/10/2023   Pt will demonstrate independence with ongoing HEP. Baseline:  Goal status: IN PROGRESS  03/02/23 - HEP updated today 04/15/23 updated again  2.  Pt will report an improvement in score by 10 points on the QuickDASH to demonstrate an increase in function and decrease in pain.  Baseline: TBD  Goal status: IN PROGRESS 04/15/23: 65.9%  3.  Pt will increase UE strength to grossly >/= 4/5 to address deficits seen above and improve overall function Baseline:  Goal status: IN PROGRESS   6/6/24R shoulder strength grossly 3 to 3+/5   4.  Pt will increase R shoulder ROM to [facility] to address deficits seen above and improve overall function Baseline:  Goal status: IN PROGRESS  05/18/23  5.  Patient to demonstrate improved upright posture with posterior shoulder girdle engaged to promote improved glenohumeral joint mobility Baseline:  Goal status: IN PROGRESS  03/02/23 - decreased scapular activation with increased shoulder hiking evident during shoulder elevation 04/15/23: poor mechanics, shrugging evident R shoulder  6.  Pt will verbalize a decrease in pain of 50% to increase her QOL and increase her involvement in her community Baseline:  Goal status: IN PROGRESS  03/02/23 - patient reports consistent increased levels of pain 04/15/23:  pain same rating as last visit 6 weeks ago   PLAN:  PT FREQUENCY: 2x/week  PT DURATION: 6 weeks  PLANNED INTERVENTIONS: Therapeutic exercises, Therapeutic activity, Neuromuscular re-education, Patient/Family education, Self Care, Joint mobilization, DME instructions, Dry Needling, Electrical stimulation, Cryotherapy, Moist heat, Taping, Vasopneumatic device, Ultrasound, Manual therapy, and Re-evaluation  PLAN FOR NEXT SESSION: 17 weeks post-op as of 04/22/23  (surgery 12/14/22) - progress with ROM, Strength to tolerance, watching scapular mechanics.    Shylynn Bruning L Verble Styron, PT 07/13/2023, 10:21 AM

## 2023-07-15 ENCOUNTER — Encounter: Payer: Self-pay | Admitting: Physical Therapy

## 2023-07-15 ENCOUNTER — Ambulatory Visit: Payer: Medicare HMO | Admitting: Physical Therapy

## 2023-07-15 ENCOUNTER — Other Ambulatory Visit (INDEPENDENT_AMBULATORY_CARE_PROVIDER_SITE_OTHER): Payer: Medicare HMO

## 2023-07-15 DIAGNOSIS — M25511 Pain in right shoulder: Secondary | ICD-10-CM | POA: Diagnosis not present

## 2023-07-15 DIAGNOSIS — E538 Deficiency of other specified B group vitamins: Secondary | ICD-10-CM | POA: Diagnosis not present

## 2023-07-15 DIAGNOSIS — R293 Abnormal posture: Secondary | ICD-10-CM

## 2023-07-15 DIAGNOSIS — M62838 Other muscle spasm: Secondary | ICD-10-CM

## 2023-07-15 DIAGNOSIS — R5383 Other fatigue: Secondary | ICD-10-CM | POA: Diagnosis not present

## 2023-07-15 DIAGNOSIS — Z903 Acquired absence of stomach [part of]: Secondary | ICD-10-CM

## 2023-07-15 DIAGNOSIS — M6281 Muscle weakness (generalized): Secondary | ICD-10-CM

## 2023-07-15 DIAGNOSIS — M25611 Stiffness of right shoulder, not elsewhere classified: Secondary | ICD-10-CM

## 2023-07-15 LAB — VITAMIN B12: Vitamin B-12: 645 pg/mL (ref 211–911)

## 2023-07-15 NOTE — Therapy (Signed)
OUTPATIENT PHYSICAL THERAPY TREATMENT / DISCHARGE SUMMARY    Patient Name: Robin Schmidt MRN: [id number] DOB:May 15, 1966, 57 y.o., female Today's Date: 07/15/2023    END OF SESSION:  PT End of Session - 07/15/23 1015     Visit Number 18    Date for PT Re-Evaluation 07/15/23    Authorization Type [employer]    Authorization Time Period 06/14/23 - 07/15/23    Authorization - Visit Number 7    Authorization - Number of Visits 8    PT Start Time 1016    PT Stop Time 1102    PT Time Calculation (min) 46 min    Activity Tolerance Patient tolerated treatment well    Behavior During Therapy [facility] for tasks assessed/performed                     Past Medical History:  Diagnosis Date   Anxiety    Back pain, chronic    Depression    Past Surgical History:  Procedure Laterality Date   ANTERIOR CERVICAL DECOMP/DISCECTOMY FUSION N/A 11/22/2013   Procedure: ANTERIOR CERVICAL DECOMPRESSION/DISCECTOMY FUSION 1 LEVEL;  Surgeon: Emilee Hero, MD;  Location: [facility] OR;  Service: Orthopedics;  Laterality: N/A;  Anterior cervical decompression fusion, cervical 5-6 with instrumentation and allograft   back fusion     lumbar   BACK SURGERY     KNEE ARTHROSCOPY     LAPAROSCOPIC GASTRIC SLEEVE RESECTION     2015   ROTATOR CUFF REPAIR Right 12/2022   TUBAL LIGATION     Patient Active Problem List   Diagnosis Date Noted   Full thickness rotator cuff tear 11/19/2022   Tinnitus, right 07/08/2017   Carpal tunnel syndrome 07/17/2014   Obstructive sleep apnea 07/17/2014   Radiculopathy 11/22/2013   Menometrorrhagia 06/08/2012    Class: Chronic   Chronic lower back pain 04/01/2007    PCP: Alfredia Ferguson, PA-C   REFERRING PROVIDER: Beverely Low, MD  REFERRING DIAG: [phone] (ICD-10-CM) - Complete rotator cuff tear or rupture of right shoulder, not specified as traumatic   THERAPY DIAG:  Acute pain of right shoulder  Abnormal posture  Stiffness of right shoulder, not  elsewhere classified  Other muscle spasm  Muscle weakness (generalized)  RATIONALE FOR EVALUATION AND TREATMENT: Rehabilitation  ONSET DATE:  12/14/22 - R Shoulder Scope/ Mini Open Rotator Cuff Repair    NEXT MD VISIT:  07/20/23 with Dr. Ranell Patrick (6 wks from 06/08/23 appt)   SUBJECTIVE:  SUBJECTIVE STATEMENT: Pt feels like her range is getting better but thinks things will continue to improve with time.  PAIN:  Are you having pain? Yes: NPRS scale: 7.5/10 Pain location: R upper/lateral shoulder  Pain description: sharp Aggravating factors: movement, sleeping on R side  Relieving factors: medication, warm shower, ice   PERTINENT HISTORY: Radiculopathy, Chronic back pain, Depression, OA of AC joint, L shoulder surgery - SAD/DCR 08/06/15  PRECAUTIONS: Shoulder 02/09/23: new orders received to continue and progress with behind the back, overhead, gentle PRE's with close elbow.  WEIGHT BEARING RESTRICTIONS: No  FALLS:  Has patient fallen in last 6 months? Yes. Number of falls 1 August fell in the tub due to LOB   LIVING ENVIRONMENT: Lives with: lives with their family Lives in: House/apartment Stairs: Yes: Internal: 14 steps; on right going up and External: 1 steps; none Has following equipment at home: Single point cane-does not have right now but will get it soon  OCCUPATION: Currently not working   PLOF: Independent with basic ADLs  LEISURE ACTIVITIES: walking and exercising before back and shoulder issues   PATIENT GOALS: Get ROM back, decrease overall pain    OBJECTIVE:   DIAGNOSTIC FINDINGS:  06/06/23 - R shoulder MRI scheduled  11/19/22 - R Shoulder MRI: MRI scan reviewed in detail with patient showing a high-grade partial-thickness versus full-thickness supraspinatus rotator cuff tear. No  muscular atrophy. There is some degenerative change in the superior labrum with questionable tear. Biceps located in the biceps groove. Subscap okay. Articular cartilage looks normal. AC joint with arthritis and slight outlet impingement.   PATIENT SURVEYS :  QuickDASH: 65.9% (04/15/23)  COGNITION: Overall cognitive status: Within functional limits for tasks assessed     SENSATION: N/T down the R arm sometimes, depends on movement, could also be due to wrist injury from 2 years ago  POSTURE: Rounded shoulders, forward head   UPPER EXTREMITY ROM: L WNL   Active ROM Left 03/02/23 Right 03/02/23 Right 04/15/23 Right  05/18/23 supine Right 06/03/23 seated Right 06/24/23 seated Right 07/15/23 seated  Shoulder flexion 150 82 96 131 115 121 126  Shoulder extension 55 32   40 46 58  Shoulder abduction 145 63 84 125- pain 106 108 110  Shoulder adduction         Shoulder internal rotation 74 46 To L5  FIR R lateral buttock FIR R QL level with L3 FIR L2  Shoulder external rotation 72 39 To T2 40 FER C7 55 FER T1 FER T2 64  Elbow flexion         Elbow extension         Wrist flexion         Wrist extension         Wrist ulnar deviation         Wrist radial deviation         Wrist pronation         Wrist supination          Passive ROM Right eval Right 03/02/23 Right 04/15/23 Right 06/03/23  Shoulder flexion 20*  120 118 135  Shoulder extension      Shoulder abduction  76 112 125  Shoulder adduction      Shoulder internal rotation 39* 48 25 55  Shoulder external rotation 18* 45 35 56  Elbow flexion 70*     Elbow extension 8*     Wrist flexion      Wrist extension  Wrist ulnar deviation      Wrist radial deviation      Wrist pronation 77*     Wrist supination 60*     (Blank rows = not tested)  UPPER EXTREMITY MMT: could not test R due to protocol  MMT Left eval Right 03/02/23 Right 04/15/23 Left 06/03/23 Right 06/03/23 Left 07/15/23 Right 07/15/23  Shoulder flexion 4 2+ 3 4 4- 4 4-  Shoulder  extension 5 3- 3 5 4+ 5 4+  Shoulder abduction 4+ 2 3- 5 4- 5 4  Shoulder adduction         Shoulder internal rotation 5 3 3+ 5 4 5  4+  Shoulder external rotation 4+ 2+ 3- 4+ 4- 4+ 4  Middle trapezius         Lower trapezius         Elbow flexion 5   5 4+    Elbow extension 5   5 4-    Wrist flexion         Wrist extension         Wrist ulnar deviation         Wrist radial deviation         Wrist pronation 5        Wrist supination 5        Grip strength (lbs) 40 lbs         (Blank rows = not tested)  JOINT MOBILITY TESTING:  NT due to protocol    TODAY'S TREATMENT:                                                                                                                                         DATE:   07/15/23 THERAPEUTIC EXERCISE: to improve flexibility, strength and mobility.  Verbal and tactile cues throughout for technique. UBE: L3.0 x 6 min (3' each fwd and back) R shoulder RTB rhythmic stabilization in neutral flexion, extension, IR and ER 2 x 20 sec each R shoulder rhythmic stabilization with quick small CW/CCW circles in Wb'ing through small ball on table R shoulder rhythmic stabilization with quick small CW/CCW circles in Wb'ing through small ball on wall at 90 R shoulder flexion & scpation  THERAPEUTIC ACTIVITIES: R shoulder ROM assessment B shoulder MMT Goal assessment   07/13/23: Manual: Supine for inferior glides R shoulder, with shoulder at 90 degrees abduction, to increase post/ inf capsular mobility Also supine for distraction of R humerus with lateral glides R scapula against trunk to stretch inferior capsule Trigger Point Dry-Needling  Treatment instructions: Expect mild to moderate muscle soreness. S/S of pneumothorax if dry needled over a lung field, and to seek immediate medical attention should they occur. Patient verbalized understanding of these instructions and education. Patient Consent Given: Yes Education handout provided: Previously  provided Muscles treated: R lateral deltoids, R pecs, R biceps, R infraspinatus  Treatment response/outcome: Twitch Response Elicited  and Palpable Increase in Muscle Length  Therapeutic exercise: Seated rows, hands vertical, 20#, 2 set 15 reps Seated rows, hands horizontal 20# one set 15 reps Paloff press, attempted with green t band, pt with obvious poor proximal control of humeral head, so lowered resistance to red, with improved control noted.    Moist heat R shoulder at end of session, 10 min, not included in rx charge   07/08/23 THERAPEUTIC EXERCISE: to improve flexibility, strength and mobility.  Verbal and tactile cues throughout for technique. UBE: L3.0 x 6 min (3' each fwd and back) Thoracic extension mobilization with hands behind head for pec stretch over FR 10 x 3-5" Hooklying on pool noodle - OH "Y" with looped RTB at wrists x 10 Snow angel stretch hooklying over pool noodle x 10 B shoulder protraction/retraction hooklying over pool noodle 3# in each hand x 10 S/L R shoulder flexion/scaption focusing on scapular engagement x 10  MANUAL THERAPY: To promote normalized muscle tension, improved flexibility, improved joint mobility, increased ROM, and reduced pain.  Supine R shoulder inferior and AP GH glides to stretch inferior and posterior capsule, 3 bouts 60 sec each. AAROM stretching between the jt mobilizations. Scapular mobs followed by scapular MWM during AROM shoulder flexion and scaption ROM   MODALITIES: (not part of treatment session) Moist heat pack to R shoulder x 10 to reduce post-exercise/MT soreness and inflammation   PATIENT EDUCATION: Education details: HEP review and recommended frequency for ongoing HEP at discharge to prevent loss of gains achieved with PT  Person educated: Patient Education method: Explanation Education comprehension: verbalized understanding  HOME EXERCISE PROGRAM: Access Code: [email or web address] URL:  [email or web address] Date: 07/15/2023 Prepared by: Glenetta Hew  Exercises - Seated Scapular Retraction  - 2 x daily - 7 x weekly - 2 sets - 10 reps - 3-5 seconds hold - Seated Elbow Flexion and Extension AROM  - 2 x daily - 7 x weekly - 2 sets - 10 reps - Seated Shoulder Flexion Towel Slide at Table  - 2 x daily - 7 x weekly - 2 sets - 10 reps - Isometric Shoulder External Rotation  - 1 x daily - 7 x weekly - 3 sets - 5 reps - 5 sec hold - Isometric Shoulder Internal Rotation  - 1 x daily - 7 x weekly - 3 sets - 5 reps - 5 sec hold - Isometric Shoulder Flexion  - 1 x daily - 7 x weekly - 3 sets - 5 reps - 5 sec hold - Isometric Shoulder Abduction  - 1 x daily - 7 x weekly - 3 sets - 5 reps - 5 sec hold - Seated Shoulder Flexion AAROM with Pulley Behind (Mirrored)  - 1 x daily - 7 x weekly - 2 sets - 10 reps - 3 sec hold - Seated Shoulder Abduction AAROM with Pulley Behind (Mirrored)  - 1 x daily - 7 x weekly - 2 sets - 10 reps - 3 sec hold - Seated Shoulder External Rotation AAROM with Cane and Hand in Neutral  - 1 x daily - 7 x weekly - 2 sets - 10 reps - Supine Shoulder Flexion Extension AAROM with Dowel  - 1 x daily - 7 x weekly - 2 sets - 10 reps - 3 sec hold - Supine Shoulder External Rotation in 45 Degrees Abduction AAROM with Dowel  - 1 x daily - 7 x weekly - 2 sets - 10 reps - 3 sec hold - Supine Shoulder Protraction with  Dowel  - 1 x daily - 7 x weekly - 2 sets - 10 reps - 3 sec hold - Standing Shoulder Abduction ROM with Dowel  - 1 x daily - 7 x weekly - 2 sets - 10 reps - 3 sec hold - Standing Bilateral Low Shoulder Row with Anchored Resistance  - 1 x daily - 3 x weekly - 2 sets - 10 reps - 5 sec hold - Scapular Retraction with Resistance Advanced  - 1 x daily - 3 x weekly - 2 sets - 10 reps - 5 sec hold - Shoulder External Rotation with Anchored Resistance  - 1 x daily - 3 x weekly - 2 sets - 10 reps - Shoulder Internal Rotation with Resistance  - 1 x daily - 3 x  weekly - 2 sets - 10 reps - Standing Shoulder External Rotation with Resistance  - 1 x daily - 3 x weekly - 2 sets - 10 reps - 3 sec hold - Prone Scapular Retraction and Row (Mirrored)  - 1 x daily - 7 x weekly - 2 sets - 10 reps - 3 sec hold - Prone Shoulder Extension - Single Arm  - 1 x daily - 7 x weekly - 2 sets - 10 reps - 3 sec hold - Prone Single Arm Shoulder Horizontal Abduction with Scapular Retraction and Palm Down (Mirrored)  - 1 x daily - 7 x weekly - 2 sets - 10 reps - 3 sec hold - Sidelying Shoulder External Rotation (Mirrored)  - 1 x daily - 7 x weekly - 2 sets - 10 reps - 3 sec hold - Sidelying Shoulder Abduction Palm Forward  - 1 x daily - 7 x weekly - 2 sets - 10 reps - 3 sec hold - Sidelying Shoulder Flexion 15 Degrees (Mirrored)  - 1 x daily - 7 x weekly - 2 sets - 10 reps - 3 sec hold - Supine Shoulder Flexion Extension Full Range AROM  - 1 x daily - 7 x weekly - 2 sets - 10 reps - Standing Tricep Extensions with Resistance  - 1 x daily - 3 x weekly - 2 sets - 10 reps - 3 sec hold - Standing Overhead Shoulder External Rotation Stretch with Towel (Mirrored)  - 1 x daily - 7 x weekly - 10 reps - 3 sec hold - Standing Shoulder Internal Rotation Stretch with Towel  - 1 x daily - 7 x weekly - 10 reps - 3 sec hold - Serratus Activation at Wall  - 1 x daily - 3 x weekly - 2 sets - 10 reps - 3 sec hold - Wall Clock with Theraband  - 1 x daily - 3 x weekly - 2 sets - 10 reps - 3 sec hold - Standing Wall [employer] with [employer]  - 1 x daily - 3 x weekly - 2 sets - 20 sec hold - Standing Wall [employer] in Scaption with Mini Swiss Ball  - 1 x daily - 3 x weekly - 2 sets - 20 sec hold - Scapular Retraction with Resistance Advanced  - 1 x daily - 3 x weekly - 2 sets - 20 sec hold   ASSESSMENT:  CLINICAL IMPRESSION:  Bridgitte continues to demonstrate gradual improvement in overall R shoulder ROM and strength but continues to be most limited with OH ROM, in part due to impaired  R GH mechanics with R shoulder elevation. She is aware of need for better awareness  and engagement of scapular muscles and when reminded to utilize these patterns, she is able to partially correct. She has a comprehensive HEP and has repeated declined/refused PT efforts to consolidate the HEP. All PT goals now met or partially met and Ica feels ready to transition to her HEP, therefore will proceed with discharge from physical therapy for this episode.  OBJECTIVE IMPAIRMENTS: decreased activity tolerance, decreased endurance, decreased knowledge of use of DME, decreased mobility, decreased ROM, decreased strength, increased edema, increased fascial restrictions, impaired perceived functional ability, increased muscle spasms, impaired flexibility, impaired sensation, impaired UE functional use, improper body mechanics, postural dysfunction, and pain.   ACTIVITY LIMITATIONS: carrying, lifting, bending, sitting, standing, sleeping, bed mobility, bathing, toileting, dressing, reach over head, hygiene/grooming, locomotion level, and caring for others  PARTICIPATION LIMITATIONS: meal prep, cleaning, laundry, driving, shopping, community activity, occupation, and yard work  PERSONAL FACTORS: Past/current experiences and 3+ comorbidities: Radiculopathy, Chronic back pain, Depression, OA of AC joint, L shoulder surgery - SAD/DCR   are also affecting patient's functional outcome.   REHAB POTENTIAL: Good  CLINICAL DECISION MAKING: Stable/uncomplicated  EVALUATION COMPLEXITY: Low   GOALS: Goals reviewed with patient? Yes  SHORT TERM GOALS: Target date: 01/28/2023, extended to 03/30/2023   Pt will be independent with original HEP. Baseline: Goal status: MET  03/02/23  2.  Pt will verbalize a decrease in pain by 25% to increase her QOL and increase her tolerance for exercise and ROM. Baseline:  Goal status: MET  03/02/23 - patient reports consistent increased levels of pain; 04/15/23 - rates 7.5/10 which  is higher pain level; 06/01/23 - pain 8/10 in recent visits; 06/03/23 - pt reports 20% improvement in R shoulder pain; 06/17/23 - 60% improvement  LONG TERM GOALS: Target date: extended to 07/15/23   Pt will demonstrate independence with ongoing HEP. Baseline:  Goal status: MET  07/15/23  2.  Pt will report an improvement in score by 10 points on the QuickDASH to demonstrate an increase in function and decrease in pain.  Baseline: 65.9% (04/15/23)  Goal status: MET  04/15/23 - 65.9%; 06/03/23 - 54.5 %  3.  Pt will increase UE strength to grossly >/= 4/5 to address deficits seen above and improve overall function Baseline: 04/15/23 - R shoulder strength grossly 3 to 3+/5; 06/03/23 - R shoulder strength grossly 4-/5 to 4+/5 for available ROM Goal status: PARTIALLY MET   07/15/23 - R shoulder strength grossly 4/5 to 4+/5 for available ROM, with exception of flexion 4-/5  4.  Pt will increase R shoulder ROM to [facility] to address deficits seen above and improve overall function Baseline: refer to above UE ROM tables Goal status: PARTIALLY MET  07/15/23 - still limited with OH motion  5.  Patient to demonstrate improved upright posture with posterior shoulder girdle engaged to promote improved glenohumeral joint mobility Baseline: 03/02/23 - decreased scapular activation with increased shoulder hiking evident during shoulder elevation;  04/15/23 - poor mechanics, shrugging evident R shoulder; 06/01/23 & 06/24/23 - continued poor mechanics with decreased awareness of alignment/movement patterns and evidence of substitution especially with shoulder shrug Goal status: PARTIALLY MET  07/14/23 - impaired R GH mechanics persist with R shoulder elevation but pt aware of proper movement patterns and can partially correct with cues  6.  Pt will verbalize a decrease in pain of 50% to increase her QOL and increase her involvement in her community Baseline: 03/02/23 - patient reports consistent increased levels of pain 04/15/23 - pain same  rating as  last visit 6 weeks ago; 06/01/23 - pain 8/10 in recent visits; 06/03/23 - pt reports 20% improvement in R shoulder pain Goal status: MET   06/17/23 - 60% improvement   PLAN:  PT FREQUENCY: 2x/week  PT DURATION: 6 weeks  PLANNED INTERVENTIONS: Therapeutic exercises, Therapeutic activity, Neuromuscular re-education, Patient/Family education, Self Care, Joint mobilization, DME instructions, Dry Needling, Electrical stimulation, Cryotherapy, Moist heat, Taping, Vasopneumatic device, Ultrasound, Manual therapy, and Re-evaluation  PLAN FOR NEXT SESSION: transition to HEP + D/C from PT   PHYSICAL THERAPY DISCHARGE SUMMARY  Visits from Start of Care: 18  Current functional level related to goals / functional outcomes: Refer to above clinical impression and goal assessment.   Remaining deficits: Impaired R GH mechanics persist with R shoulder OH elevation limiting R shoulder flexion and abduction AROM but pt aware of proper movement patterns    Education / Equipment: HEP   Patient agrees to discharge. Patient goals were partially met. Patient is being discharged due to  planning to continue with HEP on her own for a while and will f/u with MD if further PT needed in the future.  Marry Guan, PT 07/15/2023, 11:03 AM

## 2023-07-19 MED ORDER — CYANOCOBALAMIN 1000 MCG/ML IJ SOLN
1000.0000 ug | Freq: Once | INTRAMUSCULAR | Status: AC
Start: 2023-07-19 — End: 2023-07-19
  Administered 2023-07-19: 1000 ug via INTRAMUSCULAR

## 2023-07-19 NOTE — Addendum Note (Signed)
Addended by: [name] on: 07/19/2023 02:20 PM   Modules accepted: Orders

## 2023-07-21 ENCOUNTER — Other Ambulatory Visit: Payer: Self-pay

## 2023-07-21 MED ORDER — CYANOCOBALAMIN 1000 MCG/ML IJ SOLN
1000.0000 ug | Freq: Once | INTRAMUSCULAR | Status: AC
Start: 2023-07-21 — End: 2023-07-01
  Administered 2023-07-01: 1000 ug via INTRAMUSCULAR

## 2023-07-21 NOTE — Addendum Note (Signed)
Addended by: [name] on: 07/21/2023 11:11 AM   Modules accepted: Orders

## 2023-07-26 ENCOUNTER — Inpatient Hospital Stay (HOSPITAL_BASED_OUTPATIENT_CLINIC_OR_DEPARTMENT_OTHER): Admission: RE | Admit: 2023-07-26 | Payer: Medicare HMO | Source: Ambulatory Visit

## 2023-08-04 NOTE — Progress Notes (Deleted)
Robin Schmidt is a 57 y.o. female presents to the office today for Monthly B12 injections, per physician's orders. Original order: 07/15/23: "Can we set her up for monthly b12 injections now." Cyanocobalamin 1000 mg IM was administered Left arm (location) today. Patient tolerated injection. Patient due for follow up labs/provider appt: No.  Patient next injection due: 1 month, appt made {yes/no:[id number]}  Creft, Melton Alar L

## 2023-08-05 ENCOUNTER — Ambulatory Visit: Payer: Medicare HMO

## 2023-08-05 DIAGNOSIS — E538 Deficiency of other specified B group vitamins: Secondary | ICD-10-CM

## 2023-08-10 ENCOUNTER — Ambulatory Visit (HOSPITAL_BASED_OUTPATIENT_CLINIC_OR_DEPARTMENT_OTHER): Payer: Medicare HMO

## 2023-08-17 ENCOUNTER — Encounter (HOSPITAL_BASED_OUTPATIENT_CLINIC_OR_DEPARTMENT_OTHER): Payer: Self-pay

## 2023-08-17 ENCOUNTER — Ambulatory Visit (HOSPITAL_BASED_OUTPATIENT_CLINIC_OR_DEPARTMENT_OTHER)
Admission: RE | Admit: 2023-08-17 | Discharge: 2023-08-17 | Disposition: A | Discharge status: Home / Self Care | Payer: Medicare HMO | Source: Ambulatory Visit | Attending: Physician Assistant | Admitting: Physician Assistant

## 2023-08-17 DIAGNOSIS — Z1231 Encounter for screening mammogram for malignant neoplasm of breast: Secondary | ICD-10-CM | POA: Diagnosis present

## 2023-09-28 ENCOUNTER — Ambulatory Visit: Payer: Medicare HMO

## 2023-10-03 ENCOUNTER — Telehealth: Payer: Medicare HMO | Admitting: Nurse Practitioner

## 2023-10-03 DIAGNOSIS — R399 Unspecified symptoms and signs involving the genitourinary system: Secondary | ICD-10-CM | POA: Diagnosis not present

## 2023-10-03 MED ORDER — NITROFURANTOIN MONOHYD MACRO 100 MG PO CAPS
100.0000 mg | ORAL_CAPSULE | Freq: Two times a day (BID) | ORAL | 0 refills | Status: AC
Start: 2023-10-03 — End: 2023-10-08

## 2023-10-03 NOTE — Progress Notes (Signed)
Virtual Visit Consent   Robin Schmidt, you are scheduled for a virtual visit with a [facility] provider today. Just as with appointments in the office, your consent must be obtained to participate. Your consent will be active for this visit and any virtual visit you may have with one of our providers in the next 365 days. If you have a MyChart account, a copy of this consent can be sent to you electronically.  As this is a virtual visit, video technology does not allow for your provider to perform a traditional examination. This may limit your provider's ability to fully assess your condition. If your provider identifies any concerns that need to be evaluated in person or the need to arrange testing (such as labs, EKG, etc.), we will make arrangements to do so. Although advances in technology are sophisticated, we cannot ensure that it will always work on either your end or our end. If the connection with a video visit is poor, the visit may have to be switched to a telephone visit. With either a video or telephone visit, we are not always able to ensure that we have a secure connection.  By engaging in this virtual visit, you consent to the provision of healthcare and authorize for your insurance to be billed (if applicable) for the services provided during this visit. Depending on your insurance coverage, you may receive a charge related to this service.  I need to obtain your verbal consent now. Are you willing to proceed with your visit today? Robin Schmidt has provided verbal consent on 10/03/2023 for a virtual visit (video or telephone). Robin Rigg, NP  Date: 10/03/2023 10:58 AM  Virtual Visit via Video Note   I, Robin Schmidt, connected with  Robin Schmidt  ([id number], 58-14-1967) on 10/03/23 at 11:15 AM EST by a video-enabled telemedicine application and verified that I am speaking with the correct person using two identifiers.  Location: Patient: Virtual Visit Location  Patient: Home Provider: Virtual Visit Location Provider: Home Office   I discussed the limitations of evaluation and management by telemedicine and the availability of in person appointments. The patient expressed understanding and agreed to proceed.    History of Present Illness: Robin Schmidt is a 57 y.o. who identifies as a female who was assigned female at birth, and is being seen today for UTI SYMPTOMS.   Ms. Brearley notes the following symptoms: Back pain, left flank pain, decreased urinary stream. Onset of symptoms 2-3 days ago. Denies fever, N/V, dysuria.  Problems:  Patient Active Problem List   Diagnosis Date Noted   Full thickness rotator cuff tear 11/19/2022   Tinnitus, right 07/08/2017   Carpal tunnel syndrome 07/17/2014   Obstructive sleep apnea 07/17/2014   Radiculopathy 11/22/2013   Menometrorrhagia 06/08/2012    Class: Chronic   Chronic lower back pain 04/01/2007    Allergies: No Known Allergies Medications:  Current Outpatient Medications:    nitrofurantoin, macrocrystal-monohydrate, (MACROBID) 100 MG capsule, Take 1 capsule (100 mg total) by mouth 2 (two) times daily for 5 days., Disp: 10 capsule, Rfl: 0   ascorbic acid (VITAMIN C) 1000 MG tablet, Take by mouth., Disp: , Rfl:    calcium citrate-vitamin D (CITRACAL+D) 315-200 MG-UNIT tablet, Take by mouth., Disp: , Rfl:    celecoxib (CELEBREX) 200 MG capsule, TAKE 1 CAPSULE BY MOUTH EVERY DAY AS NEEDED, Disp: , Rfl:    cyclobenzaprine (FLEXERIL) 10 MG tablet, Take 10 mg by mouth 3 (three) times  daily as needed for muscle spasms., Disp: , Rfl:    DULoxetine (CYMBALTA) 30 MG capsule, Take 30 mg by mouth daily., Disp: , Rfl:    gabapentin (NEURONTIN) 300 MG capsule, Take 300 mg by mouth 2 (two) times daily.  , Disp: , Rfl:    ibuprofen (ADVIL,MOTRIN) 600 MG tablet, Take 1 tablet (600 mg total) by mouth every 6 (six) hours as needed., Disp: 30 tablet, Rfl: 0   methocarbamol (ROBAXIN) 500 MG tablet, Take 1 tablet every  6-8 hours by oral route as needed for spasm., Disp: , Rfl:    Multiple Vitamins-Minerals (MULTIVITAMIN WITH MINERALS) tablet, Take by mouth., Disp: , Rfl:    Omega-3 1000 MG CAPS, Take by mouth., Disp: , Rfl:   Observations/Objective: Patient is well-developed, well-nourished in no acute distress.  Resting comfortably at home.  Head is normocephalic, atraumatic.  No labored breathing.  Speech is clear and coherent with logical content.  Patient is alert and oriented at baseline.    Assessment and Plan: 1. UTI symptoms - nitrofurantoin, macrocrystal-monohydrate, (MACROBID) 100 MG capsule; Take 1 capsule (100 mg total) by mouth 2 (two) times daily for 5 days.  Dispense: 10 capsule; Refill: 0    Follow Up Instructions: I discussed the assessment and treatment plan with the patient. The patient was provided an opportunity to ask questions and all were answered. The patient agreed with the plan and demonstrated an understanding of the instructions.  A copy of instructions were sent to the patient via MyChart unless otherwise noted below.    The patient was advised to call back or seek an in-person evaluation if the symptoms worsen or if the condition fails to improve as anticipated.    Robin Rigg, NP

## 2023-10-03 NOTE — Patient Instructions (Signed)
  Lance Sell Bigley, thank you for joining Claiborne Rigg, NP for today's virtual visit.  While this provider is not your primary care provider (PCP), if your PCP is located in our provider database this encounter information will be shared with them immediately following your visit.   A [facility] MyChart account gives you access to today's visit and all your visits, tests, and labs performed at [facility] " click here if you don't have a [facility] MyChart account or go to [email or web address]  Consent: (Patient) Robin Schmidt provided verbal consent for this virtual visit at the beginning of the encounter.  Current Medications:  Current Outpatient Medications:    nitrofurantoin, macrocrystal-monohydrate, (MACROBID) 100 MG capsule, Take 1 capsule (100 mg total) by mouth 2 (two) times daily for 5 days., Disp: 10 capsule, Rfl: 0   ascorbic acid (VITAMIN C) 1000 MG tablet, Take by mouth., Disp: , Rfl:    calcium citrate-vitamin D (CITRACAL+D) 315-200 MG-UNIT tablet, Take by mouth., Disp: , Rfl:    celecoxib (CELEBREX) 200 MG capsule, TAKE 1 CAPSULE BY MOUTH EVERY DAY AS NEEDED, Disp: , Rfl:    cyclobenzaprine (FLEXERIL) 10 MG tablet, Take 10 mg by mouth 3 (three) times daily as needed for muscle spasms., Disp: , Rfl:    DULoxetine (CYMBALTA) 30 MG capsule, Take 30 mg by mouth daily., Disp: , Rfl:    gabapentin (NEURONTIN) 300 MG capsule, Take 300 mg by mouth 2 (two) times daily.  , Disp: , Rfl:    ibuprofen (ADVIL,MOTRIN) 600 MG tablet, Take 1 tablet (600 mg total) by mouth every 6 (six) hours as needed., Disp: 30 tablet, Rfl: 0   methocarbamol (ROBAXIN) 500 MG tablet, Take 1 tablet every 6-8 hours by oral route as needed for spasm., Disp: , Rfl:    Multiple Vitamins-Minerals (MULTIVITAMIN WITH MINERALS) tablet, Take by mouth., Disp: , Rfl:    Omega-3 1000 MG CAPS, Take by mouth., Disp: , Rfl:    Medications ordered in this encounter:  Meds ordered this encounter   Medications   nitrofurantoin, macrocrystal-monohydrate, (MACROBID) 100 MG capsule    Sig: Take 1 capsule (100 mg total) by mouth 2 (two) times daily for 5 days.    Dispense:  10 capsule    Refill:  0    Order Specific Question:   Supervising Provider    Answer:   Merrilee Jansky [id number]     *If you need refills on other medications prior to your next appointment, please contact your pharmacy*  Follow-Up: Call back or seek an in-person evaluation if the symptoms worsen or if the condition fails to improve as anticipated.  [facility] [phone]   If you have been instructed to have an in-person evaluation today at a local Urgent Care facility, please use the link below. It will take you to a list of all of our available [facility], including address, phone number and hours of operation. Please do not delay care.  [facility]  If you or a family member do not have a primary care provider, use the link below to schedule a visit and establish care. When you choose a [facility] primary care physician or advanced practice provider, you gain a long-term partner in health. Find a Primary Care Provider  Learn more about [facility] in-office and virtual care options: [facility] - Get Care Now

## 2023-10-05 ENCOUNTER — Ambulatory Visit (INDEPENDENT_AMBULATORY_CARE_PROVIDER_SITE_OTHER): Payer: Medicare HMO | Admitting: *Deleted

## 2023-10-05 DIAGNOSIS — E538 Deficiency of other specified B group vitamins: Secondary | ICD-10-CM

## 2023-10-05 MED ORDER — CYANOCOBALAMIN 1000 MCG/ML IJ SOLN
1000.0000 ug | Freq: Once | INTRAMUSCULAR | Status: AC
Start: 2023-10-05 — End: 2023-10-05
  Administered 2023-10-05: 1000 ug via INTRAMUSCULAR

## 2023-10-05 NOTE — Progress Notes (Signed)
Pt here for monthly B12 injections ordered by PCP.  Given LD without complications. Pt will call office to schedule next injection.

## 2023-11-16 NOTE — Progress Notes (Signed)
 Established patient visit   Patient: Robin Schmidt   DOB: 08-28-1966   58 y.o. Female  MRN: [id number] Visit Date: 11/17/2023  Today's healthcare provider: Manuelita Flatness, PA-C   Cc. Questions about weight loss medications  Subjective     Discussed the use of AI scribe software for clinical note transcription with the patient, who gave verbal consent to proceed.  History of Present Illness   The patient presents with recent weight gain and wrist pain. She is interested in starting [facility] for weight loss.   In addition to weight concerns, the patient is experiencing chronic wrist and shoulder pain. They have been using a diclofenac  gel (brand name Voltaren ) for pain relief, which they report has been helpful. They are also taking Celebrex (celecoxib) for pain management. The patient has an upcoming appointment to reevaluate their shoulder pain.     Medications: Outpatient Medications Prior to Visit  Medication Sig   ascorbic acid (VITAMIN C) 1000 MG tablet Take by mouth.   calcium citrate-vitamin D  (CITRACAL+D) 315-200 MG-UNIT tablet Take by mouth.   celecoxib (CELEBREX) 200 MG capsule TAKE 1 CAPSULE BY MOUTH EVERY DAY AS NEEDED   cyclobenzaprine (FLEXERIL) 10 MG tablet Take 10 mg by mouth 3 (three) times daily as needed for muscle spasms.   DULoxetine  (CYMBALTA ) 30 MG capsule Take 30 mg by mouth daily.   gabapentin  (NEURONTIN ) 300 MG capsule Take 300 mg by mouth 2 (two) times daily.     methocarbamol (ROBAXIN) 500 MG tablet Take 1 tablet every 6-8 hours by oral route as needed for spasm.   Multiple Vitamins-Minerals (MULTIVITAMIN WITH MINERALS) tablet Take by mouth.   Omega-3 1000 MG CAPS Take by mouth.   [DISCONTINUED] ibuprofen  (ADVIL ,MOTRIN ) 600 MG tablet Take 1 tablet (600 mg total) by mouth every 6 (six) hours as needed.   No facility-administered medications prior to visit.    Review of Systems  Constitutional:  Negative for fatigue and fever.  Respiratory:   Negative for cough and shortness of breath.   Cardiovascular:  Negative for chest pain and leg swelling.  Gastrointestinal:  Negative for abdominal pain.  Neurological:  Negative for dizziness and headaches.       Objective    BP 135/82   Pulse 90   Temp 98 F (36.7 C) (Oral)   Ht 5' (1.524 m)   Wt 192 lb 2 oz (87.1 kg)   LMP 03/12/2016   SpO2 96%   BMI 37.52 kg/m    Physical Exam Vitals reviewed.  Constitutional:      Appearance: She is not ill-appearing.  HENT:     Head: Normocephalic.  Eyes:     Conjunctiva/sclera: Conjunctivae normal.  Cardiovascular:     Rate and Rhythm: Normal rate.  Pulmonary:     Effort: Pulmonary effort is normal. No respiratory distress.  Neurological:     General: No focal deficit present.     Mental Status: She is alert and oriented to person, place, and time.  Psychiatric:        Mood and Affect: Mood normal.        Behavior: Behavior normal.      No results found for any visits on 11/17/23.  Assessment & Plan    Bilateral carpal tunnel syndrome Assessment & Plan: - Refill diclofenac  gel Discussed the use of diclofenac  gel in conjunction with Celebrex, emphasizing limited localized application to affected areas.  Orders: -     Diclofenac  Sodium; Apply a small  amount to areas of concern twice daily  Dispense: 150 g; Refill: 2  Obesity (BMI 30-39.9) Assessment & Plan: Medicare does not cover Wegovy or similar medications for weight loss. Discussed out of pocket expenses with Wegovy, Zepbound. Discussed that phentermine , while not covered, can be a more affordable, oop option. Phentermine  is a stimulant that acts as an appetite suppressant . Plan to use phentermine  as a short-term tool to aid in weight loss, with re-evaluation after three months. - Follow-up in three months to monitor blood pressure and assess response to phentermine   Orders: -     Phentermine  HCl; Take 1 capsule (37.5 mg total) by mouth every morning.  Dispense:  30 capsule; Refill: 3   General Health Maintenance - Schedule Pap smear within the next six months - Consider combining Pap smear appointment with three-month follow-up for phentermine .      Return in about 3 months (around 02/15/2024) for weight Management, pap smear.       Manuelita Flatness, PA-C  [facility] at [facility] [phone] (phone) [phone] (fax)  [facility]

## 2023-11-17 ENCOUNTER — Ambulatory Visit (INDEPENDENT_AMBULATORY_CARE_PROVIDER_SITE_OTHER): Payer: 59 | Admitting: Physician Assistant

## 2023-11-17 ENCOUNTER — Encounter: Payer: Self-pay | Admitting: Physician Assistant

## 2023-11-17 VITALS — BP 135/82 | HR 90 | Temp 98.0°F | Ht 60.0 in | Wt 192.1 lb

## 2023-11-17 DIAGNOSIS — G5603 Carpal tunnel syndrome, bilateral upper limbs: Secondary | ICD-10-CM

## 2023-11-17 DIAGNOSIS — E669 Obesity, unspecified: Secondary | ICD-10-CM | POA: Diagnosis not present

## 2023-11-17 MED ORDER — PHENTERMINE HCL 37.5 MG PO CAPS
37.5000 mg | ORAL_CAPSULE | ORAL | 3 refills | Status: DC
Start: 2023-11-17 — End: 2024-03-08

## 2023-11-17 MED ORDER — DICLOFENAC SODIUM 1 % EX GEL: CUTANEOUS | 2 refills | Status: DC

## 2023-11-17 NOTE — Assessment & Plan Note (Signed)
 Medicare does not cover Wegovy or similar medications for weight loss. Discussed out of pocket expenses with Wegovy, Zepbound. Discussed that phentermine , while not covered, can be a more affordable, oop option. Phentermine  is a stimulant that acts as an appetite suppressant . Plan to use phentermine  as a short-term tool to aid in weight loss, with re-evaluation after three months. - Follow-up in three months to monitor blood pressure and assess response to phentermine 

## 2023-11-17 NOTE — Assessment & Plan Note (Signed)
-   Refill diclofenac gel Discussed the use of diclofenac gel in conjunction with Celebrex, emphasizing limited localized application to affected areas.

## 2023-12-02 DIAGNOSIS — M75121 Complete rotator cuff tear or rupture of right shoulder, not specified as traumatic: Secondary | ICD-10-CM | POA: Diagnosis not present

## 2023-12-02 DIAGNOSIS — G5601 Carpal tunnel syndrome, right upper limb: Secondary | ICD-10-CM | POA: Diagnosis not present

## 2023-12-09 DIAGNOSIS — D539 Nutritional anemia, unspecified: Secondary | ICD-10-CM | POA: Diagnosis not present

## 2023-12-09 DIAGNOSIS — E559 Vitamin D deficiency, unspecified: Secondary | ICD-10-CM | POA: Diagnosis not present

## 2023-12-09 DIAGNOSIS — Z131 Encounter for screening for diabetes mellitus: Secondary | ICD-10-CM | POA: Diagnosis not present

## 2023-12-09 DIAGNOSIS — Z780 Asymptomatic menopausal state: Secondary | ICD-10-CM | POA: Diagnosis not present

## 2023-12-09 DIAGNOSIS — Z0000 Encounter for general adult medical examination without abnormal findings: Secondary | ICD-10-CM | POA: Diagnosis not present

## 2023-12-09 DIAGNOSIS — Z79899 Other long term (current) drug therapy: Secondary | ICD-10-CM | POA: Diagnosis not present

## 2023-12-13 DIAGNOSIS — H04123 Dry eye syndrome of bilateral lacrimal glands: Secondary | ICD-10-CM | POA: Diagnosis not present

## 2023-12-13 DIAGNOSIS — H524 Presbyopia: Secondary | ICD-10-CM | POA: Diagnosis not present

## 2023-12-13 DIAGNOSIS — H40003 Preglaucoma, unspecified, bilateral: Secondary | ICD-10-CM | POA: Diagnosis not present

## 2023-12-13 DIAGNOSIS — H5211 Myopia, right eye: Secondary | ICD-10-CM | POA: Diagnosis not present

## 2023-12-13 DIAGNOSIS — H2513 Age-related nuclear cataract, bilateral: Secondary | ICD-10-CM | POA: Diagnosis not present

## 2023-12-13 DIAGNOSIS — H52203 Unspecified astigmatism, bilateral: Secondary | ICD-10-CM | POA: Diagnosis not present

## 2023-12-14 DIAGNOSIS — M5412 Radiculopathy, cervical region: Secondary | ICD-10-CM | POA: Diagnosis not present

## 2023-12-14 DIAGNOSIS — G5601 Carpal tunnel syndrome, right upper limb: Secondary | ICD-10-CM | POA: Diagnosis not present

## 2023-12-16 DIAGNOSIS — E559 Vitamin D deficiency, unspecified: Secondary | ICD-10-CM | POA: Diagnosis not present

## 2023-12-16 DIAGNOSIS — L989 Disorder of the skin and subcutaneous tissue, unspecified: Secondary | ICD-10-CM | POA: Diagnosis not present

## 2023-12-16 DIAGNOSIS — J302 Other seasonal allergic rhinitis: Secondary | ICD-10-CM | POA: Diagnosis not present

## 2023-12-20 DIAGNOSIS — L729 Follicular cyst of the skin and subcutaneous tissue, unspecified: Secondary | ICD-10-CM | POA: Diagnosis not present

## 2023-12-20 DIAGNOSIS — L814 Other melanin hyperpigmentation: Secondary | ICD-10-CM | POA: Diagnosis not present

## 2023-12-22 DIAGNOSIS — M75121 Complete rotator cuff tear or rupture of right shoulder, not specified as traumatic: Secondary | ICD-10-CM | POA: Diagnosis not present

## 2023-12-22 DIAGNOSIS — G5601 Carpal tunnel syndrome, right upper limb: Secondary | ICD-10-CM | POA: Diagnosis not present

## 2023-12-24 NOTE — Therapy (Incomplete)
OUTPATIENT PHYSICAL THERAPY SHOULDER EVALUATION   Patient Name: Robin Schmidt MRN: [id number] DOB:[birth year], 58 y.o., female Today's Date: 12/24/2023   END OF SESSION:   Past Medical History:  Diagnosis Date   Anxiety    Back pain, chronic    Depression    Past Surgical History:  Procedure Laterality Date   ANTERIOR CERVICAL DECOMP/DISCECTOMY FUSION N/A 11/22/2013   Procedure: ANTERIOR CERVICAL DECOMPRESSION/DISCECTOMY FUSION 1 LEVEL;  Surgeon: Robin Hero, MD;  Location: [facility] OR;  Service: Orthopedics;  Laterality: N/A;  Anterior cervical decompression fusion, cervical 5-6 with instrumentation and allograft   back fusion     lumbar   BACK SURGERY     KNEE ARTHROSCOPY     LAPAROSCOPIC GASTRIC SLEEVE RESECTION     2015   ROTATOR CUFF REPAIR Right 12/2022   TUBAL LIGATION     Patient Active Problem List   Diagnosis Date Noted   Obesity (BMI 30-39.9) 11/17/2023   Full thickness rotator cuff tear 11/19/2022   Tinnitus, right 07/08/2017   Carpal tunnel syndrome 07/17/2014   Obstructive sleep apnea 07/17/2014   Radiculopathy 11/22/2013   Menometrorrhagia 06/08/2012    Class: Chronic   Chronic lower back pain 04/01/2007    PCP: Robin Ferguson, PA-C   REFERRING PROVIDER: Beverely Low, MD   REFERRING DIAG: M25.511 (ICD-10-CM) -  Pain in right shoulder  THERAPY DIAG:  No diagnosis found.  RATIONALE FOR EVALUATION AND TREATMENT: Rehabilitation  ONSET DATE: ***Chronic  NEXT MD VISIT: ***   SUBJECTIVE:                                                                                                                                                                                                         SUBJECTIVE STATEMENT: ***  PAIN: Are you having pain? {OPRCPAIN:27236}  PERTINENT HISTORY:  ***R shoulder RCR 12/14/22, C5-6 ACDF 2015, Cervical radiculopathy, R CTS, L shoulder surgery - SAD/DCR 08/06/15, Chronic back pain, Lumbar fusion, Anxiety,  Depression, OSA  PRECAUTIONS: {Therapy precautions:24002}  RED FLAGS: {PT Red Flags:29287}  HAND DOMINANCE: {Hand Dominance:29389}  WEIGHT BEARING RESTRICTIONS: {Yes ***/No:24003}  FALLS:  Has patient fallen in last 6 months? {fallsyesno:27318}  LIVING ENVIRONMENT: Lives with: lives with their family Lives in: House/apartment Stairs: Yes: Internal: 14 steps; on right going up and External: 1 steps; none Has following equipment at home: Single point cane  OCCUPATION: ***  PLOF: {PLOF:24004}  PATIENT GOALS: ***   OBJECTIVE: (objective measures completed at initial evaluation unless otherwise dated)  DIAGNOSTIC FINDINGS:  ***12/22/2023 - EMG nerve conduction study reviewed with  the patient showing evidence of a cervical radiculopathy as well as right hand carpal tunnel syndrome, mild with no motor nerve involvement. She has no evidence of left carpal tunnel syndrome on nerve conduction study. The cervical radiculopathy is concerning. Patient has seen Dr. Yevette Schmidt over at [facility] ortho and is planning on seeing him to review options for the neck given the ongoing cervical radiculopathy noted on the EMG/NCS here. Recommend right hand CTR here in office using WALANT technique. Patient agrees, referral made.   PATIENT SURVEYS:  Quick Dash ***  COGNITION: Overall cognitive status: {cognition:24006}     SENSATION: {sensation:27233}  POSTURE: {posture:25561}  CERVICAL ROM:   {AROM/PROM:27142} ROM Eval  Flexion   Extension   Right lateral flexion   Left lateral flexion   Right rotation   Left rotation    (Blank rows = not tested)  UPPER EXTREMITY ROM:   {AROM/PROM:27142} ROM Right eval Left eval  Shoulder flexion    Shoulder extension    Shoulder abduction    Shoulder adduction    Shoulder internal rotation    Shoulder external rotation    Elbow flexion    Elbow extension    Wrist flexion    Wrist extension    Wrist ulnar deviation    Wrist radial  deviation    Wrist pronation    Wrist supination    (Blank rows = not tested)  UPPER EXTREMITY MMT:  MMT Right eval Left eval  Shoulder flexion    Shoulder extension    Shoulder abduction    Shoulder adduction    Shoulder internal rotation    Shoulder external rotation    Middle trapezius    Lower trapezius    Elbow flexion    Elbow extension    Wrist flexion    Wrist extension    Wrist ulnar deviation    Wrist radial deviation    Wrist pronation    Wrist supination    Grip strength (lbs)    (Blank rows = not tested)  SHOULDER SPECIAL TESTS: Impingement tests: {shoulder impingement test:25231:a} SLAP lesions: {SLAP lesions:25232} Instability tests: {shoulder instability test:25233} Rotator cuff assessment: {rotator cuff assessment:25234} Biceps assessment: {biceps assessment:25235}  JOINT MOBILITY TESTING:  ***  PALPATION:  ***    TODAY'S TREATMENT:   ***   PATIENT EDUCATION:  Education details: {Education details:27468}  Person educated: {Person educated:25204} Education method: {Education Method:25205} Education comprehension: {Education Comprehension:25206}  HOME EXERCISE PROGRAM: ***   ASSESSMENT:  CLINICAL IMPRESSION: Robin Schmidt is a 58 y.o. female who was referred to physical therapy for evaluation and treatment for chronic R shoulder pain.  Recent EMG/NCV showing evidence of cervical radiculopathy and R carpal tunnel syndrome for which she is pending follow-up with her neurosurgeon, Dr. Yevette Schmidt over at [facility], in relation to the cervical radiculopathy and will be scheduled for carpal tunnel release with Dr. Ranell Schmidt on ***.  Patient reports onset of *** pain beginning ***. Pain is worse with ***.  Patient has deficits in *** ROM, *** flexibility, *** strength, ***abnormal posture, and TTP with abnormal muscle tension *** which are interfering with ADLs and are impacting quality of life.  On QuickDASH patient scored ***/100 demonstrating  ***% disability.  [name] will benefit from skilled PT to address above deficits to improve mobility and activity tolerance with decreased pain interference.   OBJECTIVE IMPAIRMENTS: {opptimpairments:25111}.   ACTIVITY LIMITATIONS: {activitylimitations:27494}  PARTICIPATION LIMITATIONS: {participationrestrictions:25113}  PERSONAL FACTORS: {Personal factors:25162} are also affecting patient's functional outcome.   REHAB POTENTIAL: {  rehabpotential:25112}  CLINICAL DECISION MAKING: {clinical decision making:25114}  EVALUATION COMPLEXITY: {Evaluation complexity:25115}   GOALS: Goals reviewed with patient? {yes/no:20286}  SHORT TERM GOALS: Target date: ***  Patient will be independent with initial HEP to improve outcomes and carryover.  Baseline: *** Goal status: {GOALSTATUS:25110}  2.  Patient will report 25% improvement in *** shoulder pain to improve QOL.   Baseline: *** Goal status: {GOALSTATUS:25110}  3.  *** Baseline: *** Goal status: {GOALSTATUS:25110}  LONG TERM GOALS: Target date: ***  Patient will be independent with ongoing/advanced HEP for self-management at home.  Baseline: *** Goal status: {GOALSTATUS:25110}  2.  Patient will report 50-75% improvement in *** shoulder pain to improve QOL.  Baseline: *** Goal status: {GOALSTATUS:25110}  3.  Patient to demonstrate improved upright posture with posterior shoulder girdle engaged to promote improved glenohumeral joint mobility. Baseline: *** Goal status: {GOALSTATUS:25110}  4.  Patient to improve *** shoulder AROM to {Functional status:27472} without pain provocation to allow for increased ease of ADLs.  Baseline: *** Goal status: {GOALSTATUS:25110}  5.  Patient will demonstrate improved *** strength to >/= ***/5 for functional UE use. Baseline: *** Goal status: {GOALSTATUS:25110}  6  Patient will report </= ***% on QuickDASH (MCID = 14%) to demonstrate improved functional ability.  Baseline: *** Goal  status: {GOALSTATUS:25110}  7.  Patient will ***   Baseline: *** Goal status: {GOALSTATUS:25110}   8. *** Baseline: *** Goal status: {GOALSTATUS:25110}   PLAN:  PT FREQUENCY: {rehab frequency:25116}  PT DURATION: {rehab duration:25117}  PLANNED INTERVENTIONS: {rehab planned interventions:25118::"97110-Therapeutic exercises","97530- Therapeutic [id number]- Neuromuscular re-education","97535- Self HKVQ","25956- Manual therapy"}  PLAN FOR NEXT SESSION: ***   Marry Guan, PT 12/24/2023, 2:11 PM

## 2023-12-29 ENCOUNTER — Ambulatory Visit: Payer: 59 | Admitting: Physical Therapy

## 2023-12-30 NOTE — Therapy (Signed)
OUTPATIENT PHYSICAL THERAPY SHOULDER EVALUATION   Patient Name: Robin Schmidt MRN: [id number] DOB:[birth year], 58 y.o., female Today's Date: 12/31/2023   END OF SESSION:  PT End of Session - 12/31/23 1022     Visit Number 1    Date for PT Re-Evaluation 02/25/24    Authorization Type [employer] Dual Complete    PT Start Time 1022   Pt arrived late   PT Stop Time 1124    PT Time Calculation (min) 62 min    Activity Tolerance Patient tolerated treatment well;Patient limited by pain    Behavior During Therapy [facility] for tasks assessed/performed             Past Medical History:  Diagnosis Date   Anxiety    Back pain, chronic    Depression    Past Surgical History:  Procedure Laterality Date   ANTERIOR CERVICAL DECOMP/DISCECTOMY FUSION N/A 11/22/2013   Procedure: ANTERIOR CERVICAL DECOMPRESSION/DISCECTOMY FUSION 1 LEVEL;  Surgeon: Emilee Hero, MD;  Location: [facility] OR;  Service: Orthopedics;  Laterality: N/A;  Anterior cervical decompression fusion, cervical 5-6 with instrumentation and allograft   back fusion     lumbar   BACK SURGERY     KNEE ARTHROSCOPY     LAPAROSCOPIC GASTRIC SLEEVE RESECTION     2015   ROTATOR CUFF REPAIR Right 12/2022   TUBAL LIGATION     Patient Active Problem List   Diagnosis Date Noted   Obesity (BMI 30-39.9) 11/17/2023   Full thickness rotator cuff tear 11/19/2022   Tinnitus, right 07/08/2017   Carpal tunnel syndrome 07/17/2014   Obstructive sleep apnea 07/17/2014   Radiculopathy 11/22/2013   Menometrorrhagia 06/08/2012    Class: Chronic   Chronic lower back pain 04/01/2007    PCP: Alfredia Ferguson, PA-C   REFERRING PROVIDER: Beverely Low, MD   REFERRING DIAG: M25.511 (ICD-10-CM) -  Pain in right shoulder  THERAPY DIAG:  Chronic right shoulder pain  Abnormal posture  Stiffness of right shoulder, not elsewhere classified  Muscle weakness (generalized)  Other muscle spasm  RATIONALE FOR EVALUATION AND TREATMENT:  Rehabilitation  ONSET DATE: Chronic  NEXT MD VISIT: 01/13/24   SUBJECTIVE:                                                                                                                                                                                                         SUBJECTIVE STATEMENT: Pt reports ongoing pain upper lateral shoulder since her R shoulder surgery in 12/2022 which causes her shoulder to lock up. Also notes recently diagnosed with B  CTS and pinched nerves in her neck. When she sleeps on her R side her hand goes numb.  She notes her arm gets weak when her shoulder "locks up" while trying to do her exercises.  PAIN: Are you having pain? Yes: NPRS scale: 8/10, ranging up to 9/10  Pain location: R upper lateral shoulder Pain description: dull ache, pins & needles, stabbing  Aggravating factors: "laying wrong" (lying on R side), lifting, gets weak and pain increases when she is more active Relieving factors: muscle relaxant, heat > cold   PERTINENT HISTORY:  R shoulder RCR 12/14/22, C5-6 ACDF 2015, Cervical radiculopathy, R CTS, L shoulder surgery - SAD/DCR 08/06/15, Chronic back pain, Lumbar fusion, Anxiety, Depression, OSA  PRECAUTIONS: None  RED FLAGS: None  HAND DOMINANCE: Right  WEIGHT BEARING RESTRICTIONS: No  FALLS:  Has patient fallen in last 6 months? No  LIVING ENVIRONMENT: Lives with: lives with their family Lives in: House/apartment Stairs: Yes: Internal: 14 steps; on right going up and External: 1 steps; none Has following equipment at home: Single point cane  OCCUPATION: On disability  PLOF: Independent with household mobility without device, Independent with gait, Independent with transfers, Needs assistance with ADLs, Needs assistance with homemaking, and Leisure: mostly sedentary due to depression and cold weather   PATIENT GOALS: "To be more flexible and pain free if possible."   OBJECTIVE: (objective measures completed at initial evaluation  unless otherwise dated)  DIAGNOSTIC FINDINGS:  12/22/2023 - EMG nerve conduction study reviewed with the patient showing evidence of a cervical radiculopathy as well as right hand carpal tunnel syndrome, mild with no motor nerve involvement. She has no evidence of left carpal tunnel syndrome on nerve conduction study. The cervical radiculopathy is concerning. Patient has seen Dr. Yevette Edwards over at [facility] ortho and is planning on seeing him to review options for the neck given the ongoing cervical radiculopathy noted on the EMG/NCS here. Recommend right hand CTR here in office using WALANT technique. Patient agrees, referral made.   PATIENT SURVEYS:  Quick Dash 52.3 / 100 = 52.3 %; limitations also potentially impacted by cervical radiculopathy and B CTS  COGNITION: Overall cognitive status: Within functional limits for tasks assessed     SENSATION: Intermittent numbness and tingling in radial side of hand, wrist and forearm  POSTURE: rounded shoulders, forward head, decreased lumbar lordosis, increased thoracic kyphosis, and R shoulder depressed relative to L  CERVICAL ROM:   Active ROM Eval  Flexion 40  Extension 28  Right lateral flexion 28  Left lateral flexion 21  Right rotation 42  Left rotation 51   (Blank rows = not tested)  UPPER EXTREMITY ROM:   Active ROM Right eval Left eval  Shoulder flexion 121 142  Shoulder extension 25 34  Shoulder abduction 107 160  Shoulder adduction    Shoulder internal rotation FIR lateral lower ribs FIR T10  Shoulder external rotation FER T4 FER T2  Elbow flexion    Elbow extension    Wrist flexion    Wrist extension    Wrist ulnar deviation    Wrist radial deviation    Wrist pronation    Wrist supination    (Blank rows = not tested)  UPPER EXTREMITY MMT:  MMT Right eval Left eval  Shoulder flexion 4- 4  Shoulder extension 4- 4  Shoulder abduction 4- 4-  Shoulder adduction    Shoulder internal rotation 4 4+  Shoulder  external rotation 4- 4+  Middle trapezius 4- 4  Lower trapezius  3- 3+  Elbow flexion 5 4+  Elbow extension 5 4-  Wrist flexion    Wrist extension    Wrist ulnar deviation    Wrist radial deviation    Wrist pronation    Wrist supination    Grip strength (lbs) 42# 45#  (Blank rows = not tested)  SHOULDER SPECIAL TESTS: Impingement tests: Neer impingement test: positive  and Hawkins/Kennedy impingement test: positive  Rotator cuff assessment: Empty can test: positive  and Full can test: negative  JOINT MOBILITY TESTING:  R shoulder restricted in all planes  PALPATION:  Increased muscle tension and TTP in R UT, LS, all RTC muscles, lats, pecs     TODAY'S TREATMENT:   12/31/2023 - Eval SELF CARE:  Reviewed eval findings and role of PT in addressing identified deficits as well as review of postural awareness and instruction in initial HEP (see below).    PATIENT EDUCATION:  Education details: PT eval findings, anticipated POC, and initial HEP  Person educated: Patient Education method: Explanation, Demonstration, Verbal cues, and Handouts Education comprehension: verbalized understanding, returned demonstration, verbal cues required, and needs further education  HOME EXERCISE PROGRAM: Access Code: [email or web address] URL: [email or web address] Date: 12/31/2023 Prepared by: Glenetta Hew  Exercises - Seated Scapular Retraction with External Rotation  - 2 x daily - 7 x weekly - 2 sets - 10 reps - 3 sec hold - Seated Thoracic Lumbar Extension  - 2 x daily - 7 x weekly - 2 sets - 10 reps - 3 sec hold  Access Code from prior PT episode: [email or web address]    ASSESSMENT:  CLINICAL IMPRESSION: Robin Schmidt is a 58 y.o. female who was referred to physical therapy for evaluation and treatment for chronic R shoulder pain.  Recent EMG/NCV showing evidence of cervical radiculopathy and R carpal tunnel syndrome for which she is pending follow-up with her neurosurgeon, Dr. Yevette Edwards over at  [facility], in relation to the cervical radiculopathy and will be scheduled for carpal tunnel release with Dr. Ranell Patrick.  Patient reports ongoing R pain since R RCR in 12/2022. Pain is worse with lying on R side and lifting, and patient notes progressive weakness as well as increased pain with any increased in activity.  Patient has deficits in cervicsl and R shoulder ROM, cervical and postural flexibility, R>L shoulder/UE strength, abnormal posture, and TTP with abnormal muscle tension t/o R shoulder complex which are interfering with ADLs and are impacting quality of life.  On QuickDASH patient scored 52.3/100 demonstrating 52.3% disability.  Channie will benefit from skilled PT to address above deficits to improve mobility and activity tolerance with decreased pain interference.  OBJECTIVE IMPAIRMENTS: decreased activity tolerance, decreased endurance, decreased knowledge of condition, decreased mobility, decreased ROM, decreased strength, hypomobility, increased fascial restrictions, impaired perceived functional ability, increased muscle spasms, impaired flexibility, impaired sensation, impaired UE functional use, improper body mechanics, postural dysfunction, and pain.   ACTIVITY LIMITATIONS: carrying, lifting, sleeping, bed mobility, bathing, dressing, reach over head, hygiene/grooming, and caring for others  PARTICIPATION LIMITATIONS: meal prep, cleaning, laundry, shopping, and community activity  PERSONAL FACTORS: Behavior pattern, Fitness, Past/current experiences, Time since onset of injury/illness/exacerbation, and 3+ comorbidities: R shoulder RCR 12/14/22, C5-6 ACDF 2015, Cervical radiculopathy, R CTS, L shoulder surgery - SAD/DCR 08/06/15, Chronic back pain, Lumbar fusion, Anxiety, Depression, OSA  are also affecting patient's functional outcome.   REHAB POTENTIAL: Good  CLINICAL DECISION MAKING: Unstable/unpredictable  EVALUATION COMPLEXITY: High   GOALS: Goals reviewed with patient?  Yes  SHORT TERM GOALS:  Target date: 01/21/2024  Patient will be independent with initial HEP to improve outcomes and carryover.  Baseline:  Goal status: INITIAL  2.  Patient will report 25% improvement in R shoulder pain to improve QOL.   Baseline: 8-9/10 Goal status: INITIAL  LONG TERM GOALS: Target date: 02/25/2024  Patient will be independent with ongoing/advanced HEP for self-management at home.  Baseline:  Goal status: INITIAL  2.  Patient will report 50-75% improvement in R shoulder pain to improve QOL.  Baseline: 8-9/10 Goal status: INITIAL  3.  Patient to demonstrate improved upright posture with posterior shoulder girdle engaged to promote improved glenohumeral joint mobility. Baseline:  Goal status: INITIAL  4.  Patient to improve R shoulder AROM to [facility] without pain provocation to allow for increased ease of ADLs.  Baseline: Refer to above UE ROM table Goal status: INITIAL  5.  Patient will demonstrate improved B shoulder and scapular strength to >/= 4 to 4+/5 for functional UE use. Baseline: Refer to above UE MMT table Goal status: INITIAL  6  Patient will report </= 42% on QuickDASH to demonstrate improved functional ability.  Baseline: 52.3 / 100 = 52.3 % Goal status: INITIAL   PLAN:  PT FREQUENCY: 2x/week  PT DURATION: 4-8 weeks  PLANNED INTERVENTIONS: 97164- PT Re-evaluation, 97110-Therapeutic exercises, 97530- Therapeutic activity, [id number]- Neuromuscular re-education, 97535- Self Care, 16109- Manual therapy, [id number]- Electrical stimulation (unattended), [phone]- Electrical stimulation (manual), 97016- Vasopneumatic device, [id number]- Ultrasound, 09811- Ionotophoresis 4mg /ml Dexamethasone, Patient/Family education, Taping, Dry Needling, Joint mobilization, Spinal mobilization, Cryotherapy, and Moist heat  PLAN FOR NEXT SESSION: MT including TPDN as indicated to address abnormal muscle tension in R shoulder complex; review initial HEP and progress postural  awareness/strengthening; initiate gentle R shoulder A/AAROM; initiate gentle RTC strengthening; modalities PRN for pain management   Marry Guan, PT 12/31/2023, 12:12 PM

## 2023-12-31 ENCOUNTER — Other Ambulatory Visit: Payer: Self-pay

## 2023-12-31 ENCOUNTER — Encounter: Payer: Self-pay | Admitting: Physical Therapy

## 2023-12-31 ENCOUNTER — Ambulatory Visit: Payer: 59 | Attending: Orthopedic Surgery | Admitting: Physical Therapy

## 2023-12-31 DIAGNOSIS — M62838 Other muscle spasm: Secondary | ICD-10-CM | POA: Insufficient documentation

## 2023-12-31 DIAGNOSIS — M25511 Pain in right shoulder: Secondary | ICD-10-CM | POA: Diagnosis not present

## 2023-12-31 DIAGNOSIS — G8929 Other chronic pain: Secondary | ICD-10-CM | POA: Diagnosis not present

## 2023-12-31 DIAGNOSIS — M6281 Muscle weakness (generalized): Secondary | ICD-10-CM | POA: Insufficient documentation

## 2023-12-31 DIAGNOSIS — R293 Abnormal posture: Secondary | ICD-10-CM | POA: Insufficient documentation

## 2023-12-31 DIAGNOSIS — M25611 Stiffness of right shoulder, not elsewhere classified: Secondary | ICD-10-CM | POA: Diagnosis not present

## 2024-01-03 ENCOUNTER — Ambulatory Visit: Payer: 59 | Admitting: Physical Therapy

## 2024-01-03 ENCOUNTER — Encounter: Payer: Self-pay | Admitting: Physical Therapy

## 2024-01-03 DIAGNOSIS — M25511 Pain in right shoulder: Secondary | ICD-10-CM | POA: Diagnosis not present

## 2024-01-03 DIAGNOSIS — M25611 Stiffness of right shoulder, not elsewhere classified: Secondary | ICD-10-CM

## 2024-01-03 DIAGNOSIS — R293 Abnormal posture: Secondary | ICD-10-CM | POA: Diagnosis not present

## 2024-01-03 DIAGNOSIS — M62838 Other muscle spasm: Secondary | ICD-10-CM

## 2024-01-03 DIAGNOSIS — G8929 Other chronic pain: Secondary | ICD-10-CM

## 2024-01-03 DIAGNOSIS — M6281 Muscle weakness (generalized): Secondary | ICD-10-CM | POA: Diagnosis not present

## 2024-01-03 NOTE — Patient Instructions (Signed)

## 2024-01-03 NOTE — Therapy (Signed)
 OUTPATIENT PHYSICAL THERAPY TREATMENT   Patient Name: Robin Schmidt MRN: [id number] DOB:1966-03-31, 58 y.o., female Today's Date: 01/03/2024   END OF SESSION:  PT End of Session - 01/03/24 1103     Visit Number 2    Date for PT Re-Evaluation 02/25/24    Authorization Type [employer] Dual Complete    PT Start Time 1103    PT Stop Time 1147    PT Time Calculation (min) 44 min    Activity Tolerance Patient tolerated treatment well    Behavior During Therapy WFL for tasks assessed/performed             Past Medical History:  Diagnosis Date   Anxiety    Back pain, chronic    Depression    Past Surgical History:  Procedure Laterality Date   ANTERIOR CERVICAL DECOMP/DISCECTOMY FUSION N/A 11/22/2013   Procedure: ANTERIOR CERVICAL DECOMPRESSION/DISCECTOMY FUSION 1 LEVEL;  Surgeon: Emilee Hero, MD;  Location: [facility] OR;  Service: Orthopedics;  Laterality: N/A;  Anterior cervical decompression fusion, cervical 5-6 with instrumentation and allograft   back fusion     lumbar   BACK SURGERY     KNEE ARTHROSCOPY     LAPAROSCOPIC GASTRIC SLEEVE RESECTION     2015   ROTATOR CUFF REPAIR Right 12/2022   TUBAL LIGATION     Patient Active Problem List   Diagnosis Date Noted   Obesity (BMI 30-39.9) 11/17/2023   Full thickness rotator cuff tear 11/19/2022   Tinnitus, right 07/08/2017   Carpal tunnel syndrome 07/17/2014   Obstructive sleep apnea 07/17/2014   Radiculopathy 11/22/2013   Menometrorrhagia 06/08/2012    Class: Chronic   Chronic lower back pain 04/01/2007    PCP: Alfredia Ferguson, PA-C   REFERRING PROVIDER: Beverely Low, MD   REFERRING DIAG: M25.511 (ICD-10-CM) -  Pain in right shoulder  THERAPY DIAG:  Chronic right shoulder pain  Abnormal posture  Stiffness of right shoulder, not elsewhere classified  Muscle weakness (generalized)  Other muscle spasm  RATIONALE FOR EVALUATION AND TREATMENT: Rehabilitation  ONSET DATE: Chronic  NEXT MD VISIT:  01/13/24   SUBJECTIVE:                                                                                                                                                                                                         SUBJECTIVE STATEMENT: Pt notes initial HEP exercises seem to help some but pain still pretty bad at 8/10.  EVAL:  Pt reports ongoing pain upper lateral shoulder since her R shoulder surgery in 12/2022 which causes  her shoulder to lock up. Also notes recently diagnosed with B CTS and pinched nerves in her neck. When she sleeps on her R side her hand goes numb.  She notes her arm gets weak when her shoulder "locks up" while trying to do her exercises.  PAIN: Are you having pain? Yes: NPRS scale: 8/10  Pain location: R upper lateral shoulder Pain description: dull ache, pins & needles, stabbing  Aggravating factors: "laying wrong" (lying on R side), lifting, gets weak and pain increases when she is more active Relieving factors: muscle relaxant, heat > cold   PERTINENT HISTORY:  R shoulder RCR 12/14/22, C5-6 ACDF 2015, Cervical radiculopathy, R CTS, L shoulder surgery - SAD/DCR 08/06/15, Chronic back pain, Lumbar fusion, Anxiety, Depression, OSA  PRECAUTIONS: None  RED FLAGS: None  HAND DOMINANCE: Right  WEIGHT BEARING RESTRICTIONS: No  FALLS:  Has patient fallen in last 6 months? No  LIVING ENVIRONMENT: Lives with: lives with their family Lives in: House/apartment Stairs: Yes: Internal: 14 steps; on right going up and External: 1 steps; none Has following equipment at home: Single point cane  OCCUPATION: On disability  PLOF: Independent with household mobility without device, Independent with gait, Independent with transfers, Needs assistance with ADLs, Needs assistance with homemaking, and Leisure: mostly sedentary due to depression and cold weather   PATIENT GOALS: "To be more flexible and pain free if possible."   OBJECTIVE: (objective measures completed at  initial evaluation unless otherwise dated)  DIAGNOSTIC FINDINGS:  12/22/2023 - EMG nerve conduction study reviewed with the patient showing evidence of a cervical radiculopathy as well as right hand carpal tunnel syndrome, mild with no motor nerve involvement. She has no evidence of left carpal tunnel syndrome on nerve conduction study. The cervical radiculopathy is concerning. Patient has seen Dr. Yevette Edwards over at [facility] and is planning on seeing him to review options for the neck given the ongoing cervical radiculopathy noted on the EMG/NCS here. Recommend right hand CTR here in office using WALANT technique. Patient agrees, referral made.   PATIENT SURVEYS:  Quick Dash 52.3 / 100 = 52.3 %; limitations also potentially impacted by cervical radiculopathy and B CTS  COGNITION: Overall cognitive status: Within functional limits for tasks assessed     SENSATION: Intermittent numbness and tingling in radial side of hand, wrist and forearm  POSTURE: rounded shoulders, forward head, decreased lumbar lordosis, increased thoracic kyphosis, and R shoulder depressed relative to L  CERVICAL ROM:   Active ROM Eval  Flexion 40  Extension 28  Right lateral flexion 28  Left lateral flexion 21  Right rotation 42  Left rotation 51   (Blank rows = not tested)  UPPER EXTREMITY ROM:   Active ROM Right eval Left eval  Shoulder flexion 121 142  Shoulder extension 25 34  Shoulder abduction 107 160  Shoulder adduction    Shoulder internal rotation FIR lateral lower ribs FIR T10  Shoulder external rotation FER T4 FER T2  Elbow flexion    Elbow extension    Wrist flexion    Wrist extension    Wrist ulnar deviation    Wrist radial deviation    Wrist pronation    Wrist supination    (Blank rows = not tested)  UPPER EXTREMITY MMT:  MMT Right eval Left eval  Shoulder flexion 4- 4  Shoulder extension 4- 4  Shoulder abduction 4- 4-  Shoulder adduction    Shoulder internal rotation 4  4+  Shoulder external rotation 4- 4+  Middle trapezius 4- 4  Lower trapezius 3- 3+  Elbow flexion 5 4+  Elbow extension 5 4-  Wrist flexion    Wrist extension    Wrist ulnar deviation    Wrist radial deviation    Wrist pronation    Wrist supination    Grip strength (lbs) 42# 45#  (Blank rows = not tested)  SHOULDER SPECIAL TESTS: Impingement tests: Neer impingement test: positive  and Hawkins/Kennedy impingement test: positive  Rotator cuff assessment: Empty can test: positive  and Full can test: negative  JOINT MOBILITY TESTING:  R shoulder restricted in all planes  PALPATION:  Increased muscle tension and TTP in R UT, LS, all RTC muscles, lats, pecs     TODAY'S TREATMENT:   01/03/2024  THERAPEUTIC EXERCISE: To improve strength, endurance, ROM, and flexibility.  Demonstration, verbal and tactile cues throughout for technique.  UBE - L1.0 x 6 min (3' each fwd & back) Seated thoracolumbar extension over back of chair with arms folded over chest 10 x 5" Seated thoracolumbar extension over back of chair with with hands behind head x 5 - increased pain reported Seated scap retraction into pool noodle + B shoulder ER 10 x 3-5", 2 sets  MANUAL THERAPY: To promote normalized muscle tension, improved flexibility, improved joint mobility, increased ROM, and reduced pain. Trigger Point Dry Needling: Treatment instructions/education: Initial Treatment: Pt instructed on Dry Needling rational, procedures, and possible side effects. Pt instructed to expect mild to moderate muscle soreness later in the day and/or into the next day.  Pt instructed in methods to reduce muscle soreness. Pt instructed to continue prescribed HEP. Because Dry Needling was performed over or adjacent to a lung field, pt was educated on S/S of pneumothorax and to seek immediate medical attention should they occur.  Patient was educated on signs and symptoms of infection and other risk factors and advised to seek  medical attention should they occur.  Patient verbalized understanding of these instructions and education.  Education Handout Provided: Yes Consent: Patient Verbal Consent Given: Yes Treatment: Muscles Treated: R UT, LS, anterior and lateral deltoid Skilled palpation and monitoring of soft tissue during DN Electrical Stimulation Performed: No Treatment Response/Outcome: Twitch Response Elicited, Palpable Increase in Muscle Length, Decreased TTP, and Improved Exercise Tolerance STM/DTM, manual TPR and pin & stretch to muscles addressed with DN  NEUROMUSCULAR RE-EDUCATION: To improve coordination, kinesthesia, and posture.  Seated wand L shoulder flexion AAROM with PT facilitating scapular movement   12/31/2023 - Eval SELF CARE:  Reviewed eval findings and role of PT in addressing identified deficits as well as review of postural awareness and instruction in initial HEP (see below).    PATIENT EDUCATION:  Education details: HEP review, role of DN, and DN rational, procedure, outcomes, potential side effects, and recommended post-treatment exercises/activity  Person educated: Patient Education method: Explanation, Demonstration, Verbal cues, and Handouts Education comprehension: verbalized understanding, returned demonstration, verbal cues required, and needs further education  HOME EXERCISE PROGRAM: Access Code: [email or web address] URL: [email or web address] Date: 01/03/2024 Prepared by: Glenetta Hew  Exercises - Seated Scapular Retraction with External Rotation  - 2 x daily - 7 x weekly - 2 sets - 10 reps - 3 sec hold - Seated Thoracic Lumbar Extension  - 2 x daily - 7 x weekly - 2 sets - 10 reps - 3 sec hold  Patient Education - Trigger Point Dry Needling  Access Code from prior PT episode: [email or web address]    ASSESSMENT:  CLINICAL IMPRESSION: Robin Schmidt continues to report  elevated pain levels and R shoulder, although does note the initial HEP seems to help with her pain.   Increased muscle tension evident throughout R shoulder complex with patient reporting greatest pain in upper and lateral shoulder/upper arm.  Initiated TPDN after explanation of DN rational, procedures, outcomes and potential side effects, including precautions with DN over the lung fields, and patient verbalized consent to DN treatment in conjunction with manual STM/DTM and TPR to reduce ttp/muscle tension. Muscles treated as indicated above. DN produced normal response with good twitches elicited resulting in palpable reduction in pain/ttp and muscle tension. Pt educated to expect mild to moderate muscle soreness for up to 24-48 hrs and instructed to continue prescribed HEP and current activity level with pt verbalizing understanding of these instructions.  Reviewed initial HEP exercises clarifying desired movement patterns and postural awareness and initiated L shoulder AAROM with PT providing facilitation for proper scapular coordination with humeral elevation.  Robin Schmidt will benefit from continued skilled PT to address ongoing deficits and impairments to improve mobility and activity tolerance with decreased pain interference.   EVAL: Robin Schmidt is a 58 y.o. female who was referred to physical therapy for evaluation and treatment for chronic R shoulder pain.  Recent EMG/NCV showing evidence of cervical radiculopathy and R carpal tunnel syndrome for which she is pending follow-up with her neurosurgeon, Dr. Yevette Edwards over at [facility], in relation to the cervical radiculopathy and will be scheduled for carpal tunnel release with Dr. Ranell Patrick.  Patient reports ongoing R pain since R RCR in 12/2022. Pain is worse with lying on R side and lifting, and patient notes progressive weakness as well as increased pain with any increased in activity.  Patient has deficits in cervicsl and R shoulder ROM, cervical and postural flexibility, R>L shoulder/UE strength, abnormal posture, and TTP with abnormal muscle  tension t/o R shoulder complex which are interfering with ADLs and are impacting quality of life.  On QuickDASH patient scored 52.3/100 demonstrating 52.3% disability.  Robin Schmidt will benefit from skilled PT to address above deficits to improve mobility and activity tolerance with decreased pain interference.  OBJECTIVE IMPAIRMENTS: decreased activity tolerance, decreased endurance, decreased knowledge of condition, decreased mobility, decreased ROM, decreased strength, hypomobility, increased fascial restrictions, impaired perceived functional ability, increased muscle spasms, impaired flexibility, impaired sensation, impaired UE functional use, improper body mechanics, postural dysfunction, and pain.   ACTIVITY LIMITATIONS: carrying, lifting, sleeping, bed mobility, bathing, dressing, reach over head, hygiene/grooming, and caring for others  PARTICIPATION LIMITATIONS: meal prep, cleaning, laundry, shopping, and community activity  PERSONAL FACTORS: Behavior pattern, Fitness, Past/current experiences, Time since onset of injury/illness/exacerbation, and 3+ comorbidities: R shoulder RCR 12/14/22, C5-6 ACDF 2015, Cervical radiculopathy, R CTS, L shoulder surgery - SAD/DCR 08/06/15, Chronic back pain, Lumbar fusion, Anxiety, Depression, OSA  are also affecting patient's functional outcome.   REHAB POTENTIAL: Good  CLINICAL DECISION MAKING: Unstable/unpredictable  EVALUATION COMPLEXITY: High   GOALS: Goals reviewed with patient? Yes  SHORT TERM GOALS: Target date: 01/21/2024  Patient will be independent with initial HEP to improve outcomes and carryover.  Baseline:  Goal status: IN PROGRESS  2.  Patient will report 25% improvement in R shoulder pain to improve QOL.   Baseline: 8-9/10 Goal status: IN PROGRESS  LONG TERM GOALS: Target date: 02/25/2024  Patient will be independent with ongoing/advanced HEP for self-management at home.  Baseline:  Goal status: IN PROGRESS  2.  Patient will  report 50-75% improvement in R shoulder pain to improve QOL.  Baseline:  8-9/10 Goal status: IN PROGRESS  3.  Patient to demonstrate improved upright posture with posterior shoulder girdle engaged to promote improved glenohumeral joint mobility. Baseline:  Goal status: IN PROGRESS  4.  Patient to improve R shoulder AROM to [facility] without pain provocation to allow for increased ease of ADLs.  Baseline: Refer to above UE ROM table Goal status: IN PROGRESS  5.  Patient will demonstrate improved B shoulder and scapular strength to >/= 4 to 4+/5 for functional UE use. Baseline: Refer to above UE MMT table Goal status: IN PROGRESS  6  Patient will report </= 42% on QuickDASH to demonstrate improved functional ability.  Baseline: 52.3 / 100 = 52.3 % Goal status: IN PROGRESS   PLAN:  PT FREQUENCY: 2x/week  PT DURATION: 4-8 weeks  PLANNED INTERVENTIONS: [id number]- PT Re-evaluation, 97110-Therapeutic exercises, 97530- Therapeutic activity, [id number]- Neuromuscular re-education, 97535- Self Care, [id number]- Manual therapy, [id number]- Electrical stimulation (unattended), [phone]- Electrical stimulation (manual), 97016- Vasopneumatic device, [id number]- Ultrasound, [id number]- Ionotophoresis 4mg /ml Dexamethasone, Patient/Family education, Taping, Dry Needling, Joint mobilization, Spinal mobilization, Cryotherapy, and Moist heat  PLAN FOR NEXT SESSION: Assess response to TPDN; MT including TPDN as indicated to address abnormal muscle tension in R shoulder complex; progress postural awareness/strengthening; gently R shoulder A/AAROM focusing on proper scapulohumeral kinematics; initiate gentle RTC strengthening; modalities PRN for pain management   Marry Guan, PT 01/03/2024, 12:59 PM

## 2024-01-05 ENCOUNTER — Encounter: Payer: Self-pay | Admitting: Physical Therapy

## 2024-01-05 ENCOUNTER — Ambulatory Visit: Payer: 59 | Admitting: Physical Therapy

## 2024-01-05 DIAGNOSIS — M62838 Other muscle spasm: Secondary | ICD-10-CM

## 2024-01-05 DIAGNOSIS — G8929 Other chronic pain: Secondary | ICD-10-CM

## 2024-01-05 DIAGNOSIS — M25611 Stiffness of right shoulder, not elsewhere classified: Secondary | ICD-10-CM | POA: Diagnosis not present

## 2024-01-05 DIAGNOSIS — M25511 Pain in right shoulder: Secondary | ICD-10-CM | POA: Diagnosis not present

## 2024-01-05 DIAGNOSIS — M6281 Muscle weakness (generalized): Secondary | ICD-10-CM

## 2024-01-05 DIAGNOSIS — R293 Abnormal posture: Secondary | ICD-10-CM | POA: Diagnosis not present

## 2024-01-05 NOTE — Therapy (Signed)
 OUTPATIENT PHYSICAL THERAPY TREATMENT   Patient Name: Robin Schmidt MRN: [id number] DOB:02-Dec-1965, 58 y.o., female Today's Date: 01/05/2024   END OF SESSION:  PT End of Session - 01/05/24 1019     Visit Number 3    Date for PT Re-Evaluation 02/25/24    Authorization Type [employer] Dual Complete    PT Start Time 1019    PT Stop Time 1100    PT Time Calculation (min) 41 min    Activity Tolerance Patient tolerated treatment well;Patient limited by fatigue;Patient limited by pain    Behavior During Therapy [facility] for tasks assessed/performed              Past Medical History:  Diagnosis Date   Anxiety    Back pain, chronic    Depression    Past Surgical History:  Procedure Laterality Date   ANTERIOR CERVICAL DECOMP/DISCECTOMY FUSION N/A 11/22/2013   Procedure: ANTERIOR CERVICAL DECOMPRESSION/DISCECTOMY FUSION 1 LEVEL;  Surgeon: Emilee Hero, MD;  Location: [facility] OR;  Service: Orthopedics;  Laterality: N/A;  Anterior cervical decompression fusion, cervical 5-6 with instrumentation and allograft   back fusion     lumbar   BACK SURGERY     KNEE ARTHROSCOPY     LAPAROSCOPIC GASTRIC SLEEVE RESECTION     2015   ROTATOR CUFF REPAIR Right 12/2022   TUBAL LIGATION     Patient Active Problem List   Diagnosis Date Noted   Obesity (BMI 30-39.9) 11/17/2023   Full thickness rotator cuff tear 11/19/2022   Tinnitus, right 07/08/2017   Carpal tunnel syndrome 07/17/2014   Obstructive sleep apnea 07/17/2014   Radiculopathy 11/22/2013   Menometrorrhagia 06/08/2012    Class: Chronic   Chronic lower back pain 04/01/2007    PCP: Alfredia Ferguson, PA-C   REFERRING PROVIDER: Beverely Low, MD   REFERRING DIAG: M25.511 (ICD-10-CM) -  Pain in right shoulder  THERAPY DIAG:  Chronic right shoulder pain  Abnormal posture  Stiffness of right shoulder, not elsewhere classified  Muscle weakness (generalized)  Other muscle spasm  RATIONALE FOR EVALUATION AND TREATMENT:  Rehabilitation  ONSET DATE: Chronic  NEXT MD VISIT: 01/13/24   SUBJECTIVE:                                                                                                                                                                                                         SUBJECTIVE STATEMENT: Pt reports she was a little sore after the DN but feels like she is moving a little better.  EVAL:  Pt reports ongoing pain upper lateral  shoulder since her R shoulder surgery in 12/2022 which causes her shoulder to lock up. Also notes recently diagnosed with B CTS and pinched nerves in her neck. When she sleeps on her R side her hand goes numb.  She notes her arm gets weak when her shoulder "locks up" while trying to do her exercises.  PAIN: Are you having pain? Yes: NPRS scale: 7/10  Pain location: R upper lateral shoulder Pain description: dull ache, pins & needles, stabbing  Aggravating factors: "laying wrong" (lying on R side), lifting, gets weak and pain increases when she is more active Relieving factors: muscle relaxant, heat > cold   PERTINENT HISTORY:  R shoulder RCR 12/14/22, C5-6 ACDF 2015, Cervical radiculopathy, R CTS, L shoulder surgery - SAD/DCR 08/06/15, Chronic back pain, Lumbar fusion, Anxiety, Depression, OSA  PRECAUTIONS: None  RED FLAGS: None  HAND DOMINANCE: Right  WEIGHT BEARING RESTRICTIONS: No  FALLS:  Has patient fallen in last 6 months? No  LIVING ENVIRONMENT: Lives with: lives with their family Lives in: House/apartment Stairs: Yes: Internal: 14 steps; on right going up and External: 1 steps; none Has following equipment at home: Single point cane  OCCUPATION: On disability  PLOF: Independent with household mobility without device, Independent with gait, Independent with transfers, Needs assistance with ADLs, Needs assistance with homemaking, and Leisure: mostly sedentary due to depression and cold weather   PATIENT GOALS: "To be more flexible and pain free  if possible."   OBJECTIVE: (objective measures completed at initial evaluation unless otherwise dated)  DIAGNOSTIC FINDINGS:  12/22/2023 - EMG nerve conduction study reviewed with the patient showing evidence of a cervical radiculopathy as well as right hand carpal tunnel syndrome, mild with no motor nerve involvement. She has no evidence of left carpal tunnel syndrome on nerve conduction study. The cervical radiculopathy is concerning. Patient has seen Dr. Yevette Edwards over at [facility] ortho and is planning on seeing him to review options for the neck given the ongoing cervical radiculopathy noted on the EMG/NCS here. Recommend right hand CTR here in office using WALANT technique. Patient agrees, referral made.   PATIENT SURVEYS:  Quick Dash 52.3 / 100 = 52.3 %; limitations also potentially impacted by cervical radiculopathy and B CTS  COGNITION: Overall cognitive status: Within functional limits for tasks assessed     SENSATION: Intermittent numbness and tingling in radial side of hand, wrist and forearm  POSTURE: rounded shoulders, forward head, decreased lumbar lordosis, increased thoracic kyphosis, and R shoulder depressed relative to L  CERVICAL ROM:   Active ROM Eval  Flexion 40  Extension 28  Right lateral flexion 28  Left lateral flexion 21  Right rotation 42  Left rotation 51   (Blank rows = not tested)  UPPER EXTREMITY ROM:   Active ROM Right eval Left eval  Shoulder flexion 121 142  Shoulder extension 25 34  Shoulder abduction 107 160  Shoulder adduction    Shoulder internal rotation FIR lateral lower ribs FIR T10  Shoulder external rotation FER T4 FER T2  Elbow flexion    Elbow extension    Wrist flexion    Wrist extension    Wrist ulnar deviation    Wrist radial deviation    Wrist pronation    Wrist supination    (Blank rows = not tested)  UPPER EXTREMITY MMT:  MMT Right eval Left eval  Shoulder flexion 4- 4  Shoulder extension 4- 4  Shoulder  abduction 4- 4-  Shoulder adduction    Shoulder internal  rotation 4 4+  Shoulder external rotation 4- 4+  Middle trapezius 4- 4  Lower trapezius 3- 3+  Elbow flexion 5 4+  Elbow extension 5 4-  Wrist flexion    Wrist extension    Wrist ulnar deviation    Wrist radial deviation    Wrist pronation    Wrist supination    Grip strength (lbs) 42# 45#  (Blank rows = not tested)  SHOULDER SPECIAL TESTS: Impingement tests: Neer impingement test: positive  and Hawkins/Kennedy impingement test: positive  Rotator cuff assessment: Empty can test: positive  and Full can test: negative  JOINT MOBILITY TESTING:  R shoulder restricted in all planes  PALPATION:  Increased muscle tension and TTP in R UT, LS, all RTC muscles, lats, pecs     TODAY'S TREATMENT:   01/05/2024  THERAPEUTIC EXERCISE: To improve strength, endurance, ROM, and flexibility.  Demonstration, verbal and tactile cues throughout for technique.  UBE - L1.0 x 6 min (3' each fwd & back) Seated scap retraction into pool noodle + B shoulder ER 10 x 3-5", 2 sets Seated thoracolumbar extension over back of chair with arms folded over chest 10 x 5" Seated thoracolumbar extension over back of chair with with hands behind head x 5 - still limited by tightness in lats on R Hooklying on pool noodle open books x 10  MANUAL THERAPY: To promote normalized muscle tension, improved flexibility, improved joint mobility, increased ROM, and reduced pain. Trigger Point Dry Needling: Treatment instructions/education: Subsequent Treatment: Instructions provided previously at initial dry needling treatment.  Education Handout Provided: Previously Provided Consent: Patient Verbal Consent Given: Yes Treatment: Muscles Treated: R lats, lower traps, teres group and subscapularis Skilled palpation and monitoring of soft tissue during DN Electrical Stimulation Performed: No Treatment Response/Outcome: Twitch Response Elicited, Palpable Increase in  Muscle Length, Decreased TTP, and Improved Exercise Tolerance STM/DTM, manual TPR and pin & stretch to muscles addressed with DN R scapular mobilization  NEUROMUSCULAR RE-EDUCATION: To improve coordination, kinesthesia, posture, and proprioception.  Hooklying on pool noodle B YTB scap retraction + shoulder horiz ABD x 10 Hooklying on pool noodle B YTB scap retraction + alt shoulder horiz ABD diagonals x 10 Hooklying on pool noodle B YTB scap retraction + B shoulder ER x 5 - increased pain reported on R  01/03/2024  THERAPEUTIC EXERCISE: To improve strength, endurance, ROM, and flexibility.  Demonstration, verbal and tactile cues throughout for technique.  UBE - L1.0 x 6 min (3' each fwd & back) Seated thoracolumbar extension over back of chair with arms folded over chest 10 x 5" Seated thoracolumbar extension over back of chair with with hands behind head x 5 - increased pain reported Seated scap retraction into pool noodle + B shoulder ER 10 x 3-5", 2 sets  MANUAL THERAPY: To promote normalized muscle tension, improved flexibility, improved joint mobility, increased ROM, and reduced pain. Trigger Point Dry Needling: Treatment instructions/education: Initial Treatment: Pt instructed on Dry Needling rational, procedures, and possible side effects. Pt instructed to expect mild to moderate muscle soreness later in the day and/or into the next day.  Pt instructed in methods to reduce muscle soreness. Pt instructed to continue prescribed HEP. Because Dry Needling was performed over or adjacent to a lung field, pt was educated on S/S of pneumothorax and to seek immediate medical attention should they occur.  Patient was educated on signs and symptoms of infection and other risk factors and advised to seek medical attention should they occur.  Patient verbalized  understanding of these instructions and education.  Education Handout Provided: Yes Consent: Patient Verbal Consent Given:  Yes Treatment: Muscles Treated: R UT, LS, anterior and lateral deltoid Skilled palpation and monitoring of soft tissue during DN Electrical Stimulation Performed: No Treatment Response/Outcome: Twitch Response Elicited, Palpable Increase in Muscle Length, Decreased TTP, and Improved Exercise Tolerance STM/DTM, manual TPR and pin & stretch to muscles addressed with DN  NEUROMUSCULAR RE-EDUCATION: To improve coordination, kinesthesia, and posture.  Seated wand L shoulder flexion AAROM with PT facilitating scapular movement   12/31/2023 - Eval SELF CARE:  Reviewed eval findings and role of PT in addressing identified deficits as well as review of postural awareness and instruction in initial HEP (see below).    PATIENT EDUCATION:  Education details: HEP review, role of DN, and DN rational, procedure, outcomes, potential side effects, and recommended post-treatment exercises/activity  Person educated: Patient Education method: Explanation, Demonstration, Verbal cues, and Handouts Education comprehension: verbalized understanding, returned demonstration, verbal cues required, and needs further education  HOME EXERCISE PROGRAM: Access Code: [email or web address] URL: [email or web address] Date: 01/05/2024 Prepared by: Glenetta Hew  Exercises - Seated Scapular Retraction with External Rotation  - 2 x daily - 7 x weekly - 2 sets - 10 reps - 3 sec hold - Seated Thoracic Lumbar Extension  - 2 x daily - 7 x weekly - 2 sets - 10 reps - 3 sec hold - Open Book Chest Stretch on Towel Roll  - 1 x daily - 7 x weekly - 2 sets - 10 reps - 3 sec hold - Supine Shoulder Horizontal Abduction with Resistance  - 1 x daily - 7 x weekly - 2 sets - 10 reps - 3 sec hold  Patient Education - Trigger Point Dry Needling  Access Code from prior PT episode: [email or web address]    ASSESSMENT:  CLINICAL IMPRESSION: Robin Schmidt reports some soreness as expected following the TPDN last visit but feels like she is moving a  little better.  HEP reviewed again at patient request with patient still unable to progress thoracic extension with pec stretch to hands behind head noting feeling of restriction coming from lats/lower trap area.  Addressed taut bands/abnormal muscle tension in lower and lateral periscapular muscles with MT incorporating TPDN.  Continue to progress chest opening/pec stretch and scapular activation/strengthening in hooklying over pool noodle to facilitate gravity assistance and help maintain neutral spine.  Patient continues to fatigue quickly with most exercises often requiring rest breaks during sets.  HEP updated to reflect some of exercise progressions today.  Robin Schmidt will benefit from continued skilled PT to address ongoing deficits and impairments to improve mobility and activity tolerance with decreased pain interference.   EVAL: Robin Schmidt is a 58 y.o. female who was referred to physical therapy for evaluation and treatment for chronic R shoulder pain.  Recent EMG/NCV showing evidence of cervical radiculopathy and R carpal tunnel syndrome for which she is pending follow-up with her neurosurgeon, Dr. Yevette Edwards over at [facility], in relation to the cervical radiculopathy and will be scheduled for carpal tunnel release with Dr. Ranell Patrick.  Patient reports ongoing R pain since R RCR in 12/2022. Pain is worse with lying on R side and lifting, and patient notes progressive weakness as well as increased pain with any increased in activity.  Patient has deficits in cervicsl and R shoulder ROM, cervical and postural flexibility, R>L shoulder/UE strength, abnormal posture, and TTP with abnormal muscle tension t/o R shoulder complex which are interfering with ADLs and are  impacting quality of life.  On QuickDASH patient scored 52.3/100 demonstrating 52.3% disability.  Robin Schmidt will benefit from skilled PT to address above deficits to improve mobility and activity tolerance with decreased pain  interference.  OBJECTIVE IMPAIRMENTS: decreased activity tolerance, decreased endurance, decreased knowledge of condition, decreased mobility, decreased ROM, decreased strength, hypomobility, increased fascial restrictions, impaired perceived functional ability, increased muscle spasms, impaired flexibility, impaired sensation, impaired UE functional use, improper body mechanics, postural dysfunction, and pain.   ACTIVITY LIMITATIONS: carrying, lifting, sleeping, bed mobility, bathing, dressing, reach over head, hygiene/grooming, and caring for others  PARTICIPATION LIMITATIONS: meal prep, cleaning, laundry, shopping, and community activity  PERSONAL FACTORS: Behavior pattern, Fitness, Past/current experiences, Time since onset of injury/illness/exacerbation, and 3+ comorbidities: R shoulder RCR 12/14/22, C5-6 ACDF 2015, Cervical radiculopathy, R CTS, L shoulder surgery - SAD/DCR 08/06/15, Chronic back pain, Lumbar fusion, Anxiety, Depression, OSA  are also affecting patient's functional outcome.   REHAB POTENTIAL: Good  CLINICAL DECISION MAKING: Unstable/unpredictable  EVALUATION COMPLEXITY: High   GOALS: Goals reviewed with patient? Yes  SHORT TERM GOALS: Target date: 01/21/2024  Patient will be independent with initial HEP to improve outcomes and carryover.  Baseline:  Goal status: IN PROGRESS - 01/05/24 - continued review necessary  2.  Patient will report 25% improvement in R shoulder pain to improve QOL.   Baseline: 8-9/10 Goal status: IN PROGRESS - 01/05/24 - 7/10  LONG TERM GOALS: Target date: 02/25/2024  Patient will be independent with ongoing/advanced HEP for self-management at home.  Baseline:  Goal status: IN PROGRESS  2.  Patient will report 50-75% improvement in R shoulder pain to improve QOL.  Baseline: 8-9/10 Goal status: IN PROGRESS  3.  Patient to demonstrate improved upright posture with posterior shoulder girdle engaged to promote improved glenohumeral joint  mobility. Baseline:  Goal status: IN PROGRESS  4.  Patient to improve R shoulder AROM to [facility] without pain provocation to allow for increased ease of ADLs.  Baseline: Refer to above UE ROM table Goal status: IN PROGRESS  5.  Patient will demonstrate improved B shoulder and scapular strength to >/= 4 to 4+/5 for functional UE use. Baseline: Refer to above UE MMT table Goal status: IN PROGRESS  6  Patient will report </= 42% on QuickDASH to demonstrate improved functional ability.  Baseline: 52.3 / 100 = 52.3 % Goal status: IN PROGRESS   PLAN:  PT FREQUENCY: 2x/week  PT DURATION: 4-8 weeks  PLANNED INTERVENTIONS: 97164- PT Re-evaluation, 97110-Therapeutic exercises, 97530- Therapeutic activity, [id number]- Neuromuscular re-education, 97535- Self Care, 16109- Manual therapy, [id number]- Electrical stimulation (unattended), [phone]- Electrical stimulation (manual), 97016- Vasopneumatic device, [id number]- Ultrasound, [id number]- Ionotophoresis 4mg /ml Dexamethasone, Patient/Family education, Taping, Dry Needling, Joint mobilization, Spinal mobilization, Cryotherapy, and Moist heat  PLAN FOR NEXT SESSION: Assess response to TPDN; MT including TPDN as indicated to address abnormal muscle tension in R shoulder complex; progress postural awareness/strengthening; gently R shoulder A/AAROM focusing on proper scapulohumeral kinematics; initiate gentle RTC strengthening; modalities PRN for pain management   Robin Schmidt, PT 01/05/2024, 11:14 AM

## 2024-01-07 DIAGNOSIS — G5601 Carpal tunnel syndrome, right upper limb: Secondary | ICD-10-CM | POA: Diagnosis not present

## 2024-01-07 DIAGNOSIS — M542 Cervicalgia: Secondary | ICD-10-CM | POA: Diagnosis not present

## 2024-01-07 DIAGNOSIS — M5412 Radiculopathy, cervical region: Secondary | ICD-10-CM | POA: Diagnosis not present

## 2024-01-10 ENCOUNTER — Other Ambulatory Visit (HOSPITAL_BASED_OUTPATIENT_CLINIC_OR_DEPARTMENT_OTHER): Payer: Self-pay | Admitting: Orthopedic Surgery

## 2024-01-10 DIAGNOSIS — M542 Cervicalgia: Secondary | ICD-10-CM

## 2024-01-12 ENCOUNTER — Ambulatory Visit: Payer: 59 | Admitting: Physical Therapy

## 2024-01-13 ENCOUNTER — Other Ambulatory Visit: Payer: Self-pay

## 2024-01-13 ENCOUNTER — Ambulatory Visit: Attending: Orthopedic Surgery

## 2024-01-13 DIAGNOSIS — M6281 Muscle weakness (generalized): Secondary | ICD-10-CM | POA: Insufficient documentation

## 2024-01-13 DIAGNOSIS — M62838 Other muscle spasm: Secondary | ICD-10-CM | POA: Insufficient documentation

## 2024-01-13 DIAGNOSIS — M25611 Stiffness of right shoulder, not elsewhere classified: Secondary | ICD-10-CM | POA: Diagnosis not present

## 2024-01-13 DIAGNOSIS — R293 Abnormal posture: Secondary | ICD-10-CM | POA: Insufficient documentation

## 2024-01-13 DIAGNOSIS — G8929 Other chronic pain: Secondary | ICD-10-CM | POA: Insufficient documentation

## 2024-01-13 DIAGNOSIS — M25511 Pain in right shoulder: Secondary | ICD-10-CM | POA: Insufficient documentation

## 2024-01-13 NOTE — Therapy (Signed)
 OUTPATIENT PHYSICAL THERAPY TREATMENT   Patient Name: Robin Schmidt MRN: [id number] DOB:04/15/1966, 58 y.o., female Today's Date: 01/13/2024   END OF SESSION:  PT End of Session - 01/13/24 1358     Visit Number 4    Date for PT Re-Evaluation 02/25/24    Authorization Type [employer] Dual Complete    PT Start Time 1357    PT Stop Time 1442    PT Time Calculation (min) 45 min    Activity Tolerance Patient tolerated treatment well;Patient limited by fatigue;Patient limited by pain    Behavior During Therapy [facility] for tasks assessed/performed               Past Medical History:  Diagnosis Date   Anxiety    Back pain, chronic    Depression    Past Surgical History:  Procedure Laterality Date   ANTERIOR CERVICAL DECOMP/DISCECTOMY FUSION N/A 11/22/2013   Procedure: ANTERIOR CERVICAL DECOMPRESSION/DISCECTOMY FUSION 1 LEVEL;  Surgeon: Emilee Hero, MD;  Location: [facility] OR;  Service: Orthopedics;  Laterality: N/A;  Anterior cervical decompression fusion, cervical 5-6 with instrumentation and allograft   back fusion     lumbar   BACK SURGERY     KNEE ARTHROSCOPY     LAPAROSCOPIC GASTRIC SLEEVE RESECTION     2015   ROTATOR CUFF REPAIR Right 12/2022   TUBAL LIGATION     Patient Active Problem List   Diagnosis Date Noted   Obesity (BMI 30-39.9) 11/17/2023   Full thickness rotator cuff tear 11/19/2022   Tinnitus, right 07/08/2017   Carpal tunnel syndrome 07/17/2014   Obstructive sleep apnea 07/17/2014   Radiculopathy 11/22/2013   Menometrorrhagia 06/08/2012    Class: Chronic   Chronic lower back pain 04/01/2007    PCP: Alfredia Ferguson, PA-C   REFERRING PROVIDER: Beverely Low, MD   REFERRING DIAG: M25.511 (ICD-10-CM) -  Pain in right shoulder  THERAPY DIAG:  Chronic right shoulder pain  Abnormal posture  Stiffness of right shoulder, not elsewhere classified  Muscle weakness (generalized)  Other muscle spasm  RATIONALE FOR EVALUATION AND TREATMENT:  Rehabilitation  ONSET DATE: Chronic  NEXT MD VISIT: 01/13/24   SUBJECTIVE:                                                                                                                                                                                                         SUBJECTIVE STATEMENT: Pt reports she was a little sore after the DN but feels like she is moving a little better.  EVAL:  Pt reports ongoing pain upper  lateral shoulder since her R shoulder surgery in 12/2022 which causes her shoulder to lock up. Also notes recently diagnosed with B CTS and pinched nerves in her neck. When she sleeps on her R side her hand goes numb.  She notes her arm gets weak when her shoulder "locks up" while trying to do her exercises.  PAIN: Are you having pain? Yes: NPRS scale: 7/10  Pain location: R upper lateral shoulder Pain description: dull ache, pins & needles, stabbing  Aggravating factors: "laying wrong" (lying on R side), lifting, gets weak and pain increases when she is more active Relieving factors: muscle relaxant, heat > cold   PERTINENT HISTORY:  R shoulder RCR 12/14/22, C5-6 ACDF 2015, Cervical radiculopathy, R CTS, L shoulder surgery - SAD/DCR 08/06/15, Chronic back pain, Lumbar fusion, Anxiety, Depression, OSA  PRECAUTIONS: None  RED FLAGS: None  HAND DOMINANCE: Right  WEIGHT BEARING RESTRICTIONS: No  FALLS:  Has patient fallen in last 6 months? No  LIVING ENVIRONMENT: Lives with: lives with their family Lives in: House/apartment Stairs: Yes: Internal: 14 steps; on right going up and External: 1 steps; none Has following equipment at home: Single point cane  OCCUPATION: On disability  PLOF: Independent with household mobility without device, Independent with gait, Independent with transfers, Needs assistance with ADLs, Needs assistance with homemaking, and Leisure: mostly sedentary due to depression and cold weather   PATIENT GOALS: "To be more flexible and pain free  if possible."   OBJECTIVE: (objective measures completed at initial evaluation unless otherwise dated)  DIAGNOSTIC FINDINGS:  12/22/2023 - EMG nerve conduction study reviewed with the patient showing evidence of a cervical radiculopathy as well as right hand carpal tunnel syndrome, mild with no motor nerve involvement. She has no evidence of left carpal tunnel syndrome on nerve conduction study. The cervical radiculopathy is concerning. Patient has seen Dr. Yevette Edwards over at [facility] ortho and is planning on seeing him to review options for the neck given the ongoing cervical radiculopathy noted on the EMG/NCS here. Recommend right hand CTR here in office using WALANT technique. Patient agrees, referral made.   PATIENT SURVEYS:  Quick Dash 52.3 / 100 = 52.3 %; limitations also potentially impacted by cervical radiculopathy and B CTS  COGNITION: Overall cognitive status: Within functional limits for tasks assessed     SENSATION: Intermittent numbness and tingling in radial side of hand, wrist and forearm  POSTURE: rounded shoulders, forward head, decreased lumbar lordosis, increased thoracic kyphosis, and R shoulder depressed relative to L  CERVICAL ROM:   Active ROM Eval  Flexion 40  Extension 28  Right lateral flexion 28  Left lateral flexion 21  Right rotation 42  Left rotation 51   (Blank rows = not tested)  UPPER EXTREMITY ROM:   Active ROM Right eval Left eval  Shoulder flexion 121 142  Shoulder extension 25 34  Shoulder abduction 107 160  Shoulder adduction    Shoulder internal rotation FIR lateral lower ribs FIR T10  Shoulder external rotation FER T4 FER T2  Elbow flexion    Elbow extension    Wrist flexion    Wrist extension    Wrist ulnar deviation    Wrist radial deviation    Wrist pronation    Wrist supination    (Blank rows = not tested)  UPPER EXTREMITY MMT:  MMT Right eval Left eval  Shoulder flexion 4- 4  Shoulder extension 4- 4  Shoulder  abduction 4- 4-  Shoulder adduction    Shoulder  internal rotation 4 4+  Shoulder external rotation 4- 4+  Middle trapezius 4- 4  Lower trapezius 3- 3+  Elbow flexion 5 4+  Elbow extension 5 4-  Wrist flexion    Wrist extension    Wrist ulnar deviation    Wrist radial deviation    Wrist pronation    Wrist supination    Grip strength (lbs) 42# 45#  (Blank rows = not tested)  SHOULDER SPECIAL TESTS: Impingement tests: Neer impingement test: positive  and Hawkins/Kennedy impingement test: positive  Rotator cuff assessment: Empty can test: positive  and Full can test: negative  JOINT MOBILITY TESTING:  R shoulder restricted in all planes  PALPATION:  Increased muscle tension and TTP in R UT, LS, all RTC muscles, lats, pecs     TODAY'S TREATMENT:  01/13/24:   Manual: Prone for Gr2-3 PA glides mid and lower post ribs, transverse processes thoracic spine Trigger Point Dry Needling  Subsequent Treatment: Instructions provided previously at initial dry needling treatment.  Instructions reviewed, if requested by the patient, prior to subsequent dry needling treatment.   Patient Verbal Consent Given: Yes Education Handout Provided: Previously Provided Muscles Treated: R upper traps, R infraspinatus, R teres minor Treatment Response/Outcome: twitch response R teres minor, decreased resistance noted upper traps and infraspinatus  Neuromuscular re education for periscapular region, postural musculature:  Prone for B shoulder rows at 90 degrees abd with terminal ER Prone R Y's 15 reps Side lying for scapular punches with R shoulder at 90 degrees abduction, therapist providing manual resistance/tactile cuing Supine for R scapular punches at 90 degrees flexion, therapist providing manual resistance Rhythmic stabilization, in supine with R shoulder 60 degrees abd, elbow flexed 90 degree, quick taps by therapist all planes with goal of maintaining stable position/decreased movement R  shoulder/ elbow Rhythmic stabilization, supine at 60 degrees abd, quick , low amplitude movements into IR/ER with therapist providing manual resistance     01/05/2024  THERAPEUTIC EXERCISE: To improve strength, endurance, ROM, and flexibility.  Demonstration, verbal and tactile cues throughout for technique.  UBE - L1.0 x 6 min (3' each fwd & back) Seated scap retraction into pool noodle + B shoulder ER 10 x 3-5", 2 sets Seated thoracolumbar extension over back of chair with arms folded over chest 10 x 5" Seated thoracolumbar extension over back of chair with with hands behind head x 5 - still limited by tightness in lats on R Hooklying on pool noodle open books x 10  MANUAL THERAPY: To promote normalized muscle tension, improved flexibility, improved joint mobility, increased ROM, and reduced pain. Trigger Point Dry Needling: Treatment instructions/education: Subsequent Treatment: Instructions provided previously at initial dry needling treatment.  Education Handout Provided: Previously Provided Consent: Patient Verbal Consent Given: Yes Treatment: Muscles Treated: R lats, lower traps, teres group and subscapularis Skilled palpation and monitoring of soft tissue during DN Electrical Stimulation Performed: No Treatment Response/Outcome: Twitch Response Elicited, Palpable Increase in Muscle Length, Decreased TTP, and Improved Exercise Tolerance STM/DTM, manual TPR and pin & stretch to muscles addressed with DN R scapular mobilization  NEUROMUSCULAR RE-EDUCATION: To improve coordination, kinesthesia, posture, and proprioception.  Hooklying on pool noodle B YTB scap retraction + shoulder horiz ABD x 10 Hooklying on pool noodle B YTB scap retraction + alt shoulder horiz ABD diagonals x 10 Hooklying on pool noodle B YTB scap retraction + B shoulder ER x 5 - increased pain reported on R  01/03/2024  THERAPEUTIC EXERCISE: To improve strength, endurance, ROM, and flexibility.  Demonstration,  verbal and tactile cues throughout for technique.  UBE - L1.0 x 6 min (3' each fwd & back) Seated thoracolumbar extension over back of chair with arms folded over chest 10 x 5" Seated thoracolumbar extension over back of chair with with hands behind head x 5 - increased pain reported Seated scap retraction into pool noodle + B shoulder ER 10 x 3-5", 2 sets  MANUAL THERAPY: To promote normalized muscle tension, improved flexibility, improved joint mobility, increased ROM, and reduced pain. Trigger Point Dry Needling: Treatment instructions/education: Initial Treatment: Pt instructed on Dry Needling rational, procedures, and possible side effects. Pt instructed to expect mild to moderate muscle soreness later in the day and/or into the next day.  Pt instructed in methods to reduce muscle soreness. Pt instructed to continue prescribed HEP. Because Dry Needling was performed over or adjacent to a lung field, pt was educated on S/S of pneumothorax and to seek immediate medical attention should they occur.  Patient was educated on signs and symptoms of infection and other risk factors and advised to seek medical attention should they occur.  Patient verbalized understanding of these instructions and education.  Education Handout Provided: Yes Consent: Patient Verbal Consent Given: Yes Treatment: Muscles Treated: R UT, LS, anterior and lateral deltoid Skilled palpation and monitoring of soft tissue during DN Electrical Stimulation Performed: No Treatment Response/Outcome: Twitch Response Elicited, Palpable Increase in Muscle Length, Decreased TTP, and Improved Exercise Tolerance STM/DTM, manual TPR and pin & stretch to muscles addressed with DN  NEUROMUSCULAR RE-EDUCATION: To improve coordination, kinesthesia, and posture.  Seated wand L shoulder flexion AAROM with PT facilitating scapular movement   12/31/2023 - Eval SELF CARE:  Reviewed eval findings and role of PT in addressing identified  deficits as well as review of postural awareness and instruction in initial HEP (see below).    PATIENT EDUCATION:  Education details: HEP review, role of DN, and DN rational, procedure, outcomes, potential side effects, and recommended post-treatment exercises/activity  Person educated: Patient Education method: Explanation, Demonstration, Verbal cues, and Handouts Education comprehension: verbalized understanding, returned demonstration, verbal cues required, and needs further education  HOME EXERCISE PROGRAM: Access Code: [email or web address] URL: [email or web address] Date: 01/05/2024 Prepared by: Glenetta Hew  Exercises - Seated Scapular Retraction with External Rotation  - 2 x daily - 7 x weekly - 2 sets - 10 reps - 3 sec hold - Seated Thoracic Lumbar Extension  - 2 x daily - 7 x weekly - 2 sets - 10 reps - 3 sec hold - Open Book Chest Stretch on Towel Roll  - 1 x daily - 7 x weekly - 2 sets - 10 reps - 3 sec hold - Supine Shoulder Horizontal Abduction with Resistance  - 1 x daily - 7 x weekly - 2 sets - 10 reps - 3 sec hold  Patient Education - Trigger Point Dry Needling  Access Code from prior PT episode: [id number]    ASSESSMENT:  CLINICAL IMPRESSION: Marcea reports feels like she is moving a little better. Continued today with combinations of specific motor training for mid thoracic and post shoulder musculature.  She is very restricted with mobility mid thoracic and ribs.  Also with the ex to engage rhythmic stabilization there was poor control.  Continued with TPDN as pt requested, she has good response to this technique.    Luciann will benefit from continued skilled PT to address ongoing deficits and impairments to improve mobility and activity tolerance with decreased pain interference.  EVAL: CALLEN ZUBA is a 58 y.o. female who was referred to physical therapy for evaluation and treatment for chronic R shoulder pain.  Recent EMG/NCV showing evidence of cervical  radiculopathy and R carpal tunnel syndrome for which she is pending follow-up with her neurosurgeon, Dr. Yevette Edwards over at [facility], in relation to the cervical radiculopathy and will be scheduled for carpal tunnel release with Dr. Ranell Patrick.  Patient reports ongoing R pain since R RCR in 12/2022. Pain is worse with lying on R side and lifting, and patient notes progressive weakness as well as increased pain with any increased in activity.  Patient has deficits in cervicsl and R shoulder ROM, cervical and postural flexibility, R>L shoulder/UE strength, abnormal posture, and TTP with abnormal muscle tension t/o R shoulder complex which are interfering with ADLs and are impacting quality of life.  On QuickDASH patient scored 52.3/100 demonstrating 52.3% disability.  Pleasant will benefit from skilled PT to address above deficits to improve mobility and activity tolerance with decreased pain interference.  OBJECTIVE IMPAIRMENTS: decreased activity tolerance, decreased endurance, decreased knowledge of condition, decreased mobility, decreased ROM, decreased strength, hypomobility, increased fascial restrictions, impaired perceived functional ability, increased muscle spasms, impaired flexibility, impaired sensation, impaired UE functional use, improper body mechanics, postural dysfunction, and pain.   ACTIVITY LIMITATIONS: carrying, lifting, sleeping, bed mobility, bathing, dressing, reach over head, hygiene/grooming, and caring for others  PARTICIPATION LIMITATIONS: meal prep, cleaning, laundry, shopping, and community activity  PERSONAL FACTORS: Behavior pattern, Fitness, Past/current experiences, Time since onset of injury/illness/exacerbation, and 3+ comorbidities: R shoulder RCR 12/14/22, C5-6 ACDF 2015, Cervical radiculopathy, R CTS, L shoulder surgery - SAD/DCR 08/06/15, Chronic back pain, Lumbar fusion, Anxiety, Depression, OSA  are also affecting patient's functional outcome.   REHAB POTENTIAL:  Good  CLINICAL DECISION MAKING: Unstable/unpredictable  EVALUATION COMPLEXITY: High   GOALS: Goals reviewed with patient? Yes  SHORT TERM GOALS: Target date: 01/21/2024  Patient will be independent with initial HEP to improve outcomes and carryover.  Baseline:  Goal status: IN PROGRESS - 01/05/24 - continued review necessary  2.  Patient will report 25% improvement in R shoulder pain to improve QOL.   Baseline: 8-9/10 Goal status: IN PROGRESS - 01/05/24 - 7/10  LONG TERM GOALS: Target date: 02/25/2024  Patient will be independent with ongoing/advanced HEP for self-management at home.  Baseline:  Goal status: IN PROGRESS  2.  Patient will report 50-75% improvement in R shoulder pain to improve QOL.  Baseline: 8-9/10 Goal status: IN PROGRESS  3.  Patient to demonstrate improved upright posture with posterior shoulder girdle engaged to promote improved glenohumeral joint mobility. Baseline:  Goal status: IN PROGRESS  4.  Patient to improve R shoulder AROM to [facility] without pain provocation to allow for increased ease of ADLs.  Baseline: Refer to above UE ROM table Goal status: IN PROGRESS  5.  Patient will demonstrate improved B shoulder and scapular strength to >/= 4 to 4+/5 for functional UE use. Baseline: Refer to above UE MMT table Goal status: IN PROGRESS  6  Patient will report </= 42% on QuickDASH to demonstrate improved functional ability.  Baseline: 52.3 / 100 = 52.3 % Goal status: IN PROGRESS   PLAN:  PT FREQUENCY: 2x/week  PT DURATION: 4-8 weeks  PLANNED INTERVENTIONS: 97164- PT Re-evaluation, 97110-Therapeutic exercises, 97530- Therapeutic activity, [id number]- Neuromuscular re-education, 97535- Self Care, [id number]- Manual therapy, [id number]- Electrical stimulation (unattended), [id number]- Electrical stimulation (manual), [id number]- Vasopneumatic device, [id number]- Ultrasound, [id number]- Ionotophoresis 4mg /ml Dexamethasone, Patient/Family education, Taping, Dry  Needling, Joint  mobilization, Spinal mobilization, Cryotherapy, and Moist heat  PLAN FOR NEXT SESSION: Assess response to TPDN; MT including TPDN as indicated to address abnormal muscle tension in R shoulder complex; progress postural awareness/strengthening; gently R shoulder A/AAROM focusing on proper scapulohumeral kinematics; initiate gentle RTC strengthening; modalities PRN for pain management, continue with stabilization R shoulder   Nehal Shives L Alfonsa Vaile, PT, DPT, OCS 01/13/2024, 3:03 PM

## 2024-01-18 ENCOUNTER — Ambulatory Visit: Payer: 59

## 2024-01-19 ENCOUNTER — Ambulatory Visit

## 2024-01-19 DIAGNOSIS — G8929 Other chronic pain: Secondary | ICD-10-CM

## 2024-01-19 DIAGNOSIS — M62838 Other muscle spasm: Secondary | ICD-10-CM | POA: Diagnosis not present

## 2024-01-19 DIAGNOSIS — R293 Abnormal posture: Secondary | ICD-10-CM

## 2024-01-19 DIAGNOSIS — M25611 Stiffness of right shoulder, not elsewhere classified: Secondary | ICD-10-CM | POA: Diagnosis not present

## 2024-01-19 DIAGNOSIS — M25511 Pain in right shoulder: Secondary | ICD-10-CM | POA: Diagnosis not present

## 2024-01-19 DIAGNOSIS — M6281 Muscle weakness (generalized): Secondary | ICD-10-CM

## 2024-01-19 NOTE — Therapy (Signed)
 OUTPATIENT PHYSICAL THERAPY TREATMENT   Patient Name: Robin Schmidt MRN: [id number] DOB:1966/03/17, 58 y.o., female Today's Date: 01/19/2024   END OF SESSION:  PT End of Session - 01/19/24 1503     Visit Number 5    Date for PT Re-Evaluation 02/25/24    Authorization Type [employer] Dual Complete    PT Start Time 1454    PT Stop Time 1532    PT Time Calculation (min) 38 min    Activity Tolerance Patient tolerated treatment well;Patient limited by fatigue;Patient limited by pain    Behavior During Therapy [facility] for tasks assessed/performed                Past Medical History:  Diagnosis Date   Anxiety    Back pain, chronic    Depression    Past Surgical History:  Procedure Laterality Date   ANTERIOR CERVICAL DECOMP/DISCECTOMY FUSION N/A 11/22/2013   Procedure: ANTERIOR CERVICAL DECOMPRESSION/DISCECTOMY FUSION 1 LEVEL;  Surgeon: Emilee Hero, MD;  Location: [facility] OR;  Service: Orthopedics;  Laterality: N/A;  Anterior cervical decompression fusion, cervical 5-6 with instrumentation and allograft   back fusion     lumbar   BACK SURGERY     KNEE ARTHROSCOPY     LAPAROSCOPIC GASTRIC SLEEVE RESECTION     2015   ROTATOR CUFF REPAIR Right 12/2022   TUBAL LIGATION     Patient Active Problem List   Diagnosis Date Noted   Obesity (BMI 30-39.9) 11/17/2023   Full thickness rotator cuff tear 11/19/2022   Tinnitus, right 07/08/2017   Carpal tunnel syndrome 07/17/2014   Obstructive sleep apnea 07/17/2014   Radiculopathy 11/22/2013   Menometrorrhagia 06/08/2012    Class: Chronic   Chronic lower back pain 04/01/2007    PCP: Alfredia Ferguson, PA-C   REFERRING PROVIDER: Beverely Low, MD   REFERRING DIAG: M25.511 (ICD-10-CM) -  Pain in right shoulder  THERAPY DIAG:  Chronic right shoulder pain  Abnormal posture  Stiffness of right shoulder, not elsewhere classified  Muscle weakness (generalized)  Other muscle spasm  RATIONALE FOR EVALUATION AND TREATMENT:  Rehabilitation  ONSET DATE: Chronic  NEXT MD VISIT: 01/13/24   SUBJECTIVE:                                                                                                                                                                                                         SUBJECTIVE STATEMENT: Pt reports it feels unstable in the posterior and tight, almost feels like it dislocates  EVAL:  Pt reports ongoing pain upper lateral shoulder since  her R shoulder surgery in 12/2022 which causes her shoulder to lock up. Also notes recently diagnosed with B CTS and pinched nerves in her neck. When she sleeps on her R side her hand goes numb.  She notes her arm gets weak when her shoulder "locks up" while trying to do her exercises.  PAIN: Are you having pain? Yes: NPRS scale: 7.5/10  Pain location: R upper lateral shoulder Pain description: dull ache, pins & needles, stabbing  Aggravating factors: "laying wrong" (lying on R side), lifting, gets weak and pain increases when she is more active Relieving factors: muscle relaxant, heat > cold   PERTINENT HISTORY:  R shoulder RCR 12/14/22, C5-6 ACDF 2015, Cervical radiculopathy, R CTS, L shoulder surgery - SAD/DCR 08/06/15, Chronic back pain, Lumbar fusion, Anxiety, Depression, OSA  PRECAUTIONS: None  RED FLAGS: None  HAND DOMINANCE: Right  WEIGHT BEARING RESTRICTIONS: No  FALLS:  Has patient fallen in last 6 months? No  LIVING ENVIRONMENT: Lives with: lives with their family Lives in: House/apartment Stairs: Yes: Internal: 14 steps; on right going up and External: 1 steps; none Has following equipment at home: Single point cane  OCCUPATION: On disability  PLOF: Independent with household mobility without device, Independent with gait, Independent with transfers, Needs assistance with ADLs, Needs assistance with homemaking, and Leisure: mostly sedentary due to depression and cold weather   PATIENT GOALS: "To be more flexible and pain free  if possible."   OBJECTIVE: (objective measures completed at initial evaluation unless otherwise dated)  DIAGNOSTIC FINDINGS:  12/22/2023 - EMG nerve conduction study reviewed with the patient showing evidence of a cervical radiculopathy as well as right hand carpal tunnel syndrome, mild with no motor nerve involvement. She has no evidence of left carpal tunnel syndrome on nerve conduction study. The cervical radiculopathy is concerning. Patient has seen Dr. Yevette Edwards over at [facility] ortho and is planning on seeing him to review options for the neck given the ongoing cervical radiculopathy noted on the EMG/NCS here. Recommend right hand CTR here in office using WALANT technique. Patient agrees, referral made.   PATIENT SURVEYS:  Quick Dash 52.3 / 100 = 52.3 %; limitations also potentially impacted by cervical radiculopathy and B CTS  COGNITION: Overall cognitive status: Within functional limits for tasks assessed     SENSATION: Intermittent numbness and tingling in radial side of hand, wrist and forearm  POSTURE: rounded shoulders, forward head, decreased lumbar lordosis, increased thoracic kyphosis, and R shoulder depressed relative to L  CERVICAL ROM:   Active ROM Eval  Flexion 40  Extension 28  Right lateral flexion 28  Left lateral flexion 21  Right rotation 42  Left rotation 51   (Blank rows = not tested)  UPPER EXTREMITY ROM:   Active ROM Right eval Left eval  Shoulder flexion 121 142  Shoulder extension 25 34  Shoulder abduction 107 160  Shoulder adduction    Shoulder internal rotation FIR lateral lower ribs FIR T10  Shoulder external rotation FER T4 FER T2  Elbow flexion    Elbow extension    Wrist flexion    Wrist extension    Wrist ulnar deviation    Wrist radial deviation    Wrist pronation    Wrist supination    (Blank rows = not tested)  UPPER EXTREMITY MMT:  MMT Right eval Left eval  Shoulder flexion 4- 4  Shoulder extension 4- 4  Shoulder  abduction 4- 4-  Shoulder adduction    Shoulder internal rotation 4  4+  Shoulder external rotation 4- 4+  Middle trapezius 4- 4  Lower trapezius 3- 3+  Elbow flexion 5 4+  Elbow extension 5 4-  Wrist flexion    Wrist extension    Wrist ulnar deviation    Wrist radial deviation    Wrist pronation    Wrist supination    Grip strength (lbs) 42# 45#  (Blank rows = not tested)  SHOULDER SPECIAL TESTS: Impingement tests: Neer impingement test: positive  and Hawkins/Kennedy impingement test: positive  Rotator cuff assessment: Empty can test: positive  and Full can test: negative  JOINT MOBILITY TESTING:  R shoulder restricted in all planes  PALPATION:  Increased muscle tension and TTP in R UT, LS, all RTC muscles, lats, pecs     TODAY'S TREATMENT:  01/19/24 NEUROMUSCULAR RE-EDUCATION: To improve coordination, kinesthesia, posture, and proprioception.  UBE L1.0 3 min fwd and 3 min back Seated thoracic ext in chair 10x3" Prone rows x 15 Prone Y raises x 15 Prone shoulder extension x 15 Serratus punches 1lb x 20 Supine rhythmic stab 2x MANUAL THERAPY: To promote normalized muscle tension, improved flexibility, improved joint mobility, increased ROM, and reduced pain. STM to R infraspinatus, teres minor, UT,LS  01/13/24:   Manual: Prone for Gr2-3 PA glides mid and lower post ribs, transverse processes thoracic spine Trigger Point Dry Needling  Subsequent Treatment: Instructions provided previously at initial dry needling treatment.  Instructions reviewed, if requested by the patient, prior to subsequent dry needling treatment.   Patient Verbal Consent Given: Yes Education Handout Provided: Previously Provided Muscles Treated: R upper traps, R infraspinatus, R teres minor Treatment Response/Outcome: twitch response R teres minor, decreased resistance noted upper traps and infraspinatus  Neuromuscular re education for periscapular region, postural musculature:  Prone for B  shoulder rows at 90 degrees abd with terminal ER Prone R Y's 15 reps Side lying for scapular punches with R shoulder at 90 degrees abduction, therapist providing manual resistance/tactile cuing Supine for R scapular punches at 90 degrees flexion, therapist providing manual resistance Rhythmic stabilization, in supine with R shoulder 60 degrees abd, elbow flexed 90 degree, quick taps by therapist all planes with goal of maintaining stable position/decreased movement R shoulder/ elbow Rhythmic stabilization, supine at 60 degrees abd, quick , low amplitude movements into IR/ER with therapist providing manual resistance     01/05/2024  THERAPEUTIC EXERCISE: To improve strength, endurance, ROM, and flexibility.  Demonstration, verbal and tactile cues throughout for technique.  UBE - L1.0 x 6 min (3' each fwd & back) Seated scap retraction into pool noodle + B shoulder ER 10 x 3-5", 2 sets Seated thoracolumbar extension over back of chair with arms folded over chest 10 x 5" Seated thoracolumbar extension over back of chair with with hands behind head x 5 - still limited by tightness in lats on R Hooklying on pool noodle open books x 10  MANUAL THERAPY: To promote normalized muscle tension, improved flexibility, improved joint mobility, increased ROM, and reduced pain. Trigger Point Dry Needling: Treatment instructions/education: Subsequent Treatment: Instructions provided previously at initial dry needling treatment.  Education Handout Provided: Previously Provided Consent: Patient Verbal Consent Given: Yes Treatment: Muscles Treated: R lats, lower traps, teres group and subscapularis Skilled palpation and monitoring of soft tissue during DN Electrical Stimulation Performed: No Treatment Response/Outcome: Twitch Response Elicited, Palpable Increase in Muscle Length, Decreased TTP, and Improved Exercise Tolerance STM/DTM, manual TPR and pin & stretch to muscles addressed with DN R scapular  mobilization  NEUROMUSCULAR RE-EDUCATION: To improve coordination, kinesthesia, posture, and proprioception.  Hooklying on pool noodle B YTB scap retraction + shoulder horiz ABD x 10 Hooklying on pool noodle B YTB scap retraction + alt shoulder horiz ABD diagonals x 10 Hooklying on pool noodle B YTB scap retraction + B shoulder ER x 5 - increased pain reported on R  01/03/2024  THERAPEUTIC EXERCISE: To improve strength, endurance, ROM, and flexibility.  Demonstration, verbal and tactile cues throughout for technique.  UBE - L1.0 x 6 min (3' each fwd & back) Seated thoracolumbar extension over back of chair with arms folded over chest 10 x 5" Seated thoracolumbar extension over back of chair with with hands behind head x 5 - increased pain reported Seated scap retraction into pool noodle + B shoulder ER 10 x 3-5", 2 sets  MANUAL THERAPY: To promote normalized muscle tension, improved flexibility, improved joint mobility, increased ROM, and reduced pain. Trigger Point Dry Needling: Treatment instructions/education: Initial Treatment: Pt instructed on Dry Needling rational, procedures, and possible side effects. Pt instructed to expect mild to moderate muscle soreness later in the day and/or into the next day.  Pt instructed in methods to reduce muscle soreness. Pt instructed to continue prescribed HEP. Because Dry Needling was performed over or adjacent to a lung field, pt was educated on S/S of pneumothorax and to seek immediate medical attention should they occur.  Patient was educated on signs and symptoms of infection and other risk factors and advised to seek medical attention should they occur.  Patient verbalized understanding of these instructions and education.  Education Handout Provided: Yes Consent: Patient Verbal Consent Given: Yes Treatment: Muscles Treated: R UT, LS, anterior and lateral deltoid Skilled palpation and monitoring of soft tissue during DN Electrical Stimulation  Performed: No Treatment Response/Outcome: Twitch Response Elicited, Palpable Increase in Muscle Length, Decreased TTP, and Improved Exercise Tolerance STM/DTM, manual TPR and pin & stretch to muscles addressed with DN  NEUROMUSCULAR RE-EDUCATION: To improve coordination, kinesthesia, and posture.  Seated wand L shoulder flexion AAROM with PT facilitating scapular movement   12/31/2023 - Eval SELF CARE:  Reviewed eval findings and role of PT in addressing identified deficits as well as review of postural awareness and instruction in initial HEP (see below).    PATIENT EDUCATION:  Education details: HEP review, role of DN, and DN rational, procedure, outcomes, potential side effects, and recommended post-treatment exercises/activity  Person educated: Patient Education method: Explanation, Demonstration, Verbal cues, and Handouts Education comprehension: verbalized understanding, returned demonstration, verbal cues required, and needs further education  HOME EXERCISE PROGRAM: Access Code: [email or web address] URL: [email or web address] Date: 01/05/2024 Prepared by: Glenetta Hew  Exercises - Seated Scapular Retraction with External Rotation  - 2 x daily - 7 x weekly - 2 sets - 10 reps - 3 sec hold - Seated Thoracic Lumbar Extension  - 2 x daily - 7 x weekly - 2 sets - 10 reps - 3 sec hold - Open Book Chest Stretch on Towel Roll  - 1 x daily - 7 x weekly - 2 sets - 10 reps - 3 sec hold - Supine Shoulder Horizontal Abduction with Resistance  - 1 x daily - 7 x weekly - 2 sets - 10 reps - 3 sec hold  Patient Education - Trigger Point Dry Needling  Access Code from prior PT episode: [email or web address]    ASSESSMENT:  CLINICAL IMPRESSION: Robin Schmidt reports not much change in her pain. Continued with posterior strengthening for shoulder and stabilization exercises. Good demonstration  of exercises with cuing provided. MT improved soft tissue restrictions in shoulder musculature as well. Robin Schmidt will  benefit from continued skilled PT to address ongoing deficits and impairments to improve mobility and activity tolerance with decreased pain interference.   EVAL: Robin Schmidt is a 58 y.o. female who was referred to physical therapy for evaluation and treatment for chronic R shoulder pain.  Recent EMG/NCV showing evidence of cervical radiculopathy and R carpal tunnel syndrome for which she is pending follow-up with her neurosurgeon, Dr. Yevette Edwards over at [facility], in relation to the cervical radiculopathy and will be scheduled for carpal tunnel release with Dr. Ranell Patrick.  Patient reports ongoing R pain since R RCR in 12/2022. Pain is worse with lying on R side and lifting, and patient notes progressive weakness as well as increased pain with any increased in activity.  Patient has deficits in cervicsl and R shoulder ROM, cervical and postural flexibility, R>L shoulder/UE strength, abnormal posture, and TTP with abnormal muscle tension t/o R shoulder complex which are interfering with ADLs and are impacting quality of life.  On QuickDASH patient scored 52.3/100 demonstrating 52.3% disability.  Robin Schmidt will benefit from skilled PT to address above deficits to improve mobility and activity tolerance with decreased pain interference.  OBJECTIVE IMPAIRMENTS: decreased activity tolerance, decreased endurance, decreased knowledge of condition, decreased mobility, decreased ROM, decreased strength, hypomobility, increased fascial restrictions, impaired perceived functional ability, increased muscle spasms, impaired flexibility, impaired sensation, impaired UE functional use, improper body mechanics, postural dysfunction, and pain.   ACTIVITY LIMITATIONS: carrying, lifting, sleeping, bed mobility, bathing, dressing, reach over head, hygiene/grooming, and caring for others  PARTICIPATION LIMITATIONS: meal prep, cleaning, laundry, shopping, and community activity  PERSONAL FACTORS: Behavior pattern, Fitness,  Past/current experiences, Time since onset of injury/illness/exacerbation, and 3+ comorbidities: R shoulder RCR 12/14/22, C5-6 ACDF 2015, Cervical radiculopathy, R CTS, L shoulder surgery - SAD/DCR 08/06/15, Chronic back pain, Lumbar fusion, Anxiety, Depression, OSA  are also affecting patient's functional outcome.   REHAB POTENTIAL: Good  CLINICAL DECISION MAKING: Unstable/unpredictable  EVALUATION COMPLEXITY: High   GOALS: Goals reviewed with patient? Yes  SHORT TERM GOALS: Target date: 01/21/2024  Patient will be independent with initial HEP to improve outcomes and carryover.  Baseline:  Goal status: IN PROGRESS - 01/05/24 - continued review necessary   2.  Patient will report 25% improvement in R shoulder pain to improve QOL.   Baseline: 8-9/10 Goal status: IN PROGRESS - 01/05/24 - 7/10  LONG TERM GOALS: Target date: 02/25/2024  Patient will be independent with ongoing/advanced HEP for self-management at home.  Baseline:  Goal status: IN PROGRESS  2.  Patient will report 50-75% improvement in R shoulder pain to improve QOL.  Baseline: 8-9/10 Goal status: IN PROGRESS- not much change from last visit 01/19/24  3.  Patient to demonstrate improved upright posture with posterior shoulder girdle engaged to promote improved glenohumeral joint mobility. Baseline:  Goal status: IN PROGRESS  4.  Patient to improve R shoulder AROM to [facility] without pain provocation to allow for increased ease of ADLs.  Baseline: Refer to above UE ROM table Goal status: IN PROGRESS  5.  Patient will demonstrate improved B shoulder and scapular strength to >/= 4 to 4+/5 for functional UE use. Baseline: Refer to above UE MMT table Goal status: IN PROGRESS  6  Patient will report </= 42% on QuickDASH to demonstrate improved functional ability.  Baseline: 52.3 / 100 = 52.3 % Goal status: IN PROGRESS   PLAN:  PT  FREQUENCY: 2x/week  PT DURATION: 4-8 weeks  PLANNED INTERVENTIONS: 16109- PT Re-evaluation,  97110-Therapeutic exercises, 97530- Therapeutic activity, [id number]- Neuromuscular re-education, 97535- Self Care, [id number]- Manual therapy, [id number]- Electrical stimulation (unattended), [phone]- Electrical stimulation (manual), 97016- Vasopneumatic device, [id number]- Ultrasound, [id number]- Ionotophoresis 4mg /ml Dexamethasone, Patient/Family education, Taping, Dry Needling, Joint mobilization, Spinal mobilization, Cryotherapy, and Moist heat  PLAN FOR NEXT SESSION:  MT including TPDN as indicated to address abnormal muscle tension in R shoulder complex; progress postural awareness/strengthening; gently R shoulder A/AAROM focusing on proper scapulohumeral kinematics; initiate gentle RTC strengthening; modalities PRN for pain management, continue with stabilization R shoulder   Darleene Cleaver, PTA 01/19/2024, 4:31 PM

## 2024-01-21 ENCOUNTER — Ambulatory Visit: Payer: 59 | Admitting: Physical Therapy

## 2024-01-25 ENCOUNTER — Ambulatory Visit: Payer: 59 | Admitting: Physical Therapy

## 2024-01-25 ENCOUNTER — Encounter: Payer: Self-pay | Admitting: Physical Therapy

## 2024-01-25 DIAGNOSIS — G8929 Other chronic pain: Secondary | ICD-10-CM | POA: Diagnosis not present

## 2024-01-25 DIAGNOSIS — M6281 Muscle weakness (generalized): Secondary | ICD-10-CM | POA: Diagnosis not present

## 2024-01-25 DIAGNOSIS — M62838 Other muscle spasm: Secondary | ICD-10-CM

## 2024-01-25 DIAGNOSIS — M25611 Stiffness of right shoulder, not elsewhere classified: Secondary | ICD-10-CM | POA: Diagnosis not present

## 2024-01-25 DIAGNOSIS — M25511 Pain in right shoulder: Secondary | ICD-10-CM | POA: Diagnosis not present

## 2024-01-25 DIAGNOSIS — R293 Abnormal posture: Secondary | ICD-10-CM

## 2024-01-25 NOTE — Therapy (Signed)
 OUTPATIENT PHYSICAL THERAPY TREATMENT   Patient Name: HANNAHMARIE ASBERRY MRN: [id number] DOB:12-Oct-1966, 58 y.o., female Today's Date: 01/25/2024   END OF SESSION:  PT End of Session - 01/25/24 1028     Visit Number 6    Date for PT Re-Evaluation 02/25/24    Authorization Type [employer] Dual Complete    PT Start Time 1028   Pt arrived late   PT Stop Time 1111    PT Time Calculation (min) 43 min    Activity Tolerance Patient tolerated treatment well;Patient limited by fatigue    Behavior During Therapy [facility] for tasks assessed/performed                Past Medical History:  Diagnosis Date   Anxiety    Back pain, chronic    Depression    Past Surgical History:  Procedure Laterality Date   ANTERIOR CERVICAL DECOMP/DISCECTOMY FUSION N/A 11/22/2013   Procedure: ANTERIOR CERVICAL DECOMPRESSION/DISCECTOMY FUSION 1 LEVEL;  Surgeon: Emilee Hero, MD;  Location: [facility] OR;  Service: Orthopedics;  Laterality: N/A;  Anterior cervical decompression fusion, cervical 5-6 with instrumentation and allograft   back fusion     lumbar   BACK SURGERY     KNEE ARTHROSCOPY     LAPAROSCOPIC GASTRIC SLEEVE RESECTION     2015   ROTATOR CUFF REPAIR Right 12/2022   TUBAL LIGATION     Patient Active Problem List   Diagnosis Date Noted   Obesity (BMI 30-39.9) 11/17/2023   Full thickness rotator cuff tear 11/19/2022   Tinnitus, right 07/08/2017   Carpal tunnel syndrome 07/17/2014   Obstructive sleep apnea 07/17/2014   Radiculopathy 11/22/2013   Menometrorrhagia 06/08/2012    Class: Chronic   Chronic lower back pain 04/01/2007    PCP: Alfredia Ferguson, PA-C   REFERRING PROVIDER: Beverely Low, MD   REFERRING DIAG: M25.511 (ICD-10-CM) -  Pain in right shoulder  THERAPY DIAG:  Chronic right shoulder pain  Abnormal posture  Stiffness of right shoulder, not elsewhere classified  Muscle weakness (generalized)  Other muscle spasm  RATIONALE FOR EVALUATION AND TREATMENT:  Rehabilitation  ONSET DATE: Chronic  NEXT MD VISIT: 02/04/24?   SUBJECTIVE:                                                                                                                                                                                                         SUBJECTIVE STATEMENT: Pt reports tightness in the upper shoulder today.  EVAL:  Pt reports ongoing pain upper lateral shoulder since her R shoulder surgery in 12/2022  which causes her shoulder to lock up. Also notes recently diagnosed with B CTS and pinched nerves in her neck. When she sleeps on her R side her hand goes numb.  She notes her arm gets weak when her shoulder "locks up" while trying to do her exercises.  PAIN: Are you having pain? Yes: NPRS scale: 7.5/10  Pain location: R upper lateral shoulder Pain description: sharp dull ache  Aggravating factors: increased activity  Relieving factors: muscle relaxant, heat > cold   PERTINENT HISTORY:  R shoulder RCR 12/14/22, C5-6 ACDF 2015, Cervical radiculopathy, R CTS, L shoulder surgery - SAD/DCR 08/06/15, Chronic back pain, Lumbar fusion, Anxiety, Depression, OSA  PRECAUTIONS: None  RED FLAGS: None  HAND DOMINANCE: Right  WEIGHT BEARING RESTRICTIONS: No  FALLS:  Has patient fallen in last 6 months? No  LIVING ENVIRONMENT: Lives with: lives with their family Lives in: House/apartment Stairs: Yes: Internal: 14 steps; on right going up and External: 1 steps; none Has following equipment at home: Single point cane  OCCUPATION: On disability  PLOF: Independent with household mobility without device, Independent with gait, Independent with transfers, Needs assistance with ADLs, Needs assistance with homemaking, and Leisure: mostly sedentary due to depression and cold weather   PATIENT GOALS: "To be more flexible and pain free if possible."   OBJECTIVE: (objective measures completed at initial evaluation unless otherwise dated)  DIAGNOSTIC FINDINGS:   12/22/2023 - EMG nerve conduction study reviewed with the patient showing evidence of a cervical radiculopathy as well as right hand carpal tunnel syndrome, mild with no motor nerve involvement. She has no evidence of left carpal tunnel syndrome on nerve conduction study. The cervical radiculopathy is concerning. Patient has seen Dr. Yevette Edwards over at [facility] and is planning on seeing him to review options for the neck given the ongoing cervical radiculopathy noted on the EMG/NCS here. Recommend right hand CTR here in office using WALANT technique. Patient agrees, referral made.   PATIENT SURVEYS:  Quick Dash 52.3 / 100 = 52.3 %; limitations also potentially impacted by cervical radiculopathy and B CTS  COGNITION: Overall cognitive status: Within functional limits for tasks assessed     SENSATION: Intermittent numbness and tingling in radial side of hand, wrist and forearm  POSTURE: rounded shoulders, forward head, decreased lumbar lordosis, increased thoracic kyphosis, and R shoulder depressed relative to L  CERVICAL ROM:   Active ROM Eval 01/25/24  Flexion 40 36  Extension 28 27  Right lateral flexion 28 36  Left lateral flexion 21 19  Right rotation 42 50  Left rotation 51 48   (Blank rows = not tested)  UPPER EXTREMITY ROM:   Active ROM Right eval Left eval R 01/25/24 L 01/25/24  Shoulder flexion 121 142 119 136  Shoulder extension 25 34 35 45  Shoulder abduction 107 160 105 130  Shoulder adduction      Shoulder internal rotation FIR lateral lower ribs FIR T10 L2 L1  Shoulder external rotation FER T2 FER T4 T2 T3  Elbow flexion      Elbow extension      Wrist flexion      Wrist extension      Wrist ulnar deviation      Wrist radial deviation      Wrist pronation      Wrist supination      (Blank rows = not tested)  UPPER EXTREMITY MMT:  MMT Right eval Left eval  Shoulder flexion 4- 4  Shoulder extension 4- 4  Shoulder abduction 4- 4-  Shoulder adduction     Shoulder internal rotation 4 4+  Shoulder external rotation 4- 4+  Middle trapezius 4- 4  Lower trapezius 3- 3+  Elbow flexion 5 4+  Elbow extension 5 4-  Wrist flexion    Wrist extension    Wrist ulnar deviation    Wrist radial deviation    Wrist pronation    Wrist supination    Grip strength (lbs) 42# 45#  (Blank rows = not tested)  SHOULDER SPECIAL TESTS: Impingement tests: Neer impingement test: positive  and Hawkins/Kennedy impingement test: positive  Rotator cuff assessment: Empty can test: positive  and Full can test: negative  JOINT MOBILITY TESTING:  R shoulder restricted in all planes  PALPATION:  Increased muscle tension and TTP in R UT, LS, all RTC muscles, lats, pecs     TODAY'S TREATMENT:   01/25/2024  THERAPEUTIC EXERCISE: To improve strength, endurance, ROM, and flexibility.  Demonstration, verbal and tactile cues throughout for technique.  UBE - L1.5 x 6 min (3' each fwd & back) R posterior capsule stretch 2 x 30" - cues for proper alignment and height of shoulder Lower trap setting at wall - "V" wall slide + slight lift-off x 10  MANUAL THERAPY: To promote normalized muscle tension, improved flexibility, increased ROM, and reduced pain. Trigger Point Dry Needling: Treatment instructions/education: Subsequent Treatment: Instructions provided previously at initial dry needling treatment.  Instructions reviewed, if requested by the patient, prior to subsequent dry needling treatment.  Education Handout Provided: Previously Provided Consent: Patient Verbal Consent Given: Yes Treatment: Muscles Treated: R infraspinatus, R posterior deltoid, R UT, R subscapularis Skilled palpation and monitoring of soft tissue during DN Electrical Stimulation Performed: No Treatment Response/Outcome: Twitch response elicited, Palpable increase in muscle length, Decreased tissue resistance noted, and Decreased TTP STM/DTM, manual TPR and pin & stretch to muscles addressed  with DN   01/19/24 NEUROMUSCULAR RE-EDUCATION: To improve coordination, kinesthesia, posture, and proprioception.  UBE L1.0 3 min fwd and 3 min back Seated thoracic ext in chair 10x3" Prone rows x 15 Prone Y raises x 15 Prone shoulder extension x 15 Serratus punches 1lb x 20 Supine rhythmic stab 2x MANUAL THERAPY: To promote normalized muscle tension, improved flexibility, improved joint mobility, increased ROM, and reduced pain. STM to R infraspinatus, teres minor, UT,LS   01/13/24:   Manual: Prone for Gr2-3 PA glides mid and lower post ribs, transverse processes thoracic spine Trigger Point Dry Needling  Subsequent Treatment: Instructions provided previously at initial dry needling treatment.  Instructions reviewed, if requested by the patient, prior to subsequent dry needling treatment.   Patient Verbal Consent Given: Yes Education Handout Provided: Previously Provided Muscles Treated: R upper traps, R infraspinatus, R teres minor Treatment Response/Outcome: twitch response R teres minor, decreased resistance noted upper traps and infraspinatus  Neuromuscular re education for periscapular region, postural musculature:  Prone for B shoulder rows at 90 degrees abd with terminal ER Prone R Y's 15 reps Side lying for scapular punches with R shoulder at 90 degrees abduction, therapist providing manual resistance/tactile cuing Supine for R scapular punches at 90 degrees flexion, therapist providing manual resistance Rhythmic stabilization, in supine with R shoulder 60 degrees abd, elbow flexed 90 degree, quick taps by therapist all planes with goal of maintaining stable position/decreased movement R shoulder/ elbow Rhythmic stabilization, supine at 60 degrees abd, quick , low amplitude movements into IR/ER with therapist providing manual resistance   PATIENT EDUCATION:  Education details: HEP  review, role of DN, and DN rational, procedure, outcomes, potential side effects, and  recommended post-treatment exercises/activity  Person educated: Patient Education method: Explanation, Demonstration, Verbal cues, and Handouts Education comprehension: verbalized understanding, returned demonstration, verbal cues required, and needs further education  HOME EXERCISE PROGRAM: Access Code: [email or web address] URL: [email or web address] Date: 01/05/2024 Prepared by: Glenetta Hew  Exercises - Seated Scapular Retraction with External Rotation  - 2 x daily - 7 x weekly - 2 sets - 10 reps - 3 sec hold - Seated Thoracic Lumbar Extension  - 2 x daily - 7 x weekly - 2 sets - 10 reps - 3 sec hold - Open Book Chest Stretch on Towel Roll  - 1 x daily - 7 x weekly - 2 sets - 10 reps - 3 sec hold - Supine Shoulder Horizontal Abduction with Resistance  - 1 x daily - 7 x weekly - 2 sets - 10 reps - 3 sec hold  Patient Education - Trigger Point Dry Needling  Access Code from prior PT episode: [email or web address]    ASSESSMENT:  CLINICAL IMPRESSION: Myeasha reports less pain although VAS rating unchanged.  She continues to reports stiffness, today mostly localized to posterior shoulder.  Cervical ROM slightly better in R lateral flexion and R rotation but otherwise mostly unchanged.  B shoulder extension ROM improving but remainder of motions unchanged or declining.  She requested TPDN today, therefore focused on posterior shoulder complex followed by instruction in stretching to promote maintenance of improved flexibility.  She continues to demonstrate substitution with shoulder hike/shrug with attempts at [facility] elevation, therefore worked on increasing lower trap engagement to promote improved GH mechanics.  HEP updated to reflect exercise progression.  Geovana will benefit from continued skilled PT to address ongoing deficits and impairments to improve mobility and activity tolerance with decreased pain interference.   EVAL: JYOTI HARJU is a 58 y.o. female who was referred to physical therapy  for evaluation and treatment for chronic R shoulder pain.  Recent EMG/NCV showing evidence of cervical radiculopathy and R carpal tunnel syndrome for which she is pending follow-up with her neurosurgeon, Dr. Yevette Edwards over at [facility], in relation to the cervical radiculopathy and will be scheduled for carpal tunnel release with Dr. Ranell Patrick.  Patient reports ongoing R pain since R RCR in 12/2022. Pain is worse with lying on R side and lifting, and patient notes progressive weakness as well as increased pain with any increased in activity.  Patient has deficits in cervicsl and R shoulder ROM, cervical and postural flexibility, R>L shoulder/UE strength, abnormal posture, and TTP with abnormal muscle tension t/o R shoulder complex which are interfering with ADLs and are impacting quality of life.  On QuickDASH patient scored 52.3/100 demonstrating 52.3% disability.  Swayzie will benefit from skilled PT to address above deficits to improve mobility and activity tolerance with decreased pain interference.  OBJECTIVE IMPAIRMENTS: decreased activity tolerance, decreased endurance, decreased knowledge of condition, decreased mobility, decreased ROM, decreased strength, hypomobility, increased fascial restrictions, impaired perceived functional ability, increased muscle spasms, impaired flexibility, impaired sensation, impaired UE functional use, improper body mechanics, postural dysfunction, and pain.   ACTIVITY LIMITATIONS: carrying, lifting, sleeping, bed mobility, bathing, dressing, reach over head, hygiene/grooming, and caring for others  PARTICIPATION LIMITATIONS: meal prep, cleaning, laundry, shopping, and community activity  PERSONAL FACTORS: Behavior pattern, Fitness, Past/current experiences, Time since onset of injury/illness/exacerbation, and 3+ comorbidities: R shoulder RCR 12/14/22, C5-6 ACDF 2015, Cervical radiculopathy, R CTS, L shoulder surgery - SAD/DCR 08/06/15, Chronic back  pain, Lumbar fusion,  Anxiety, Depression, OSA  are also affecting patient's functional outcome.   REHAB POTENTIAL: Good  CLINICAL DECISION MAKING: Unstable/unpredictable  EVALUATION COMPLEXITY: High   GOALS: Goals reviewed with patient? Yes  SHORT TERM GOALS: Target date: 01/21/2024  Patient will be independent with initial HEP to improve outcomes and carryover.  Baseline:  Goal status: MET - 01/25/24    2.  Patient will report 25% improvement in R shoulder pain to improve QOL.   Baseline: 8-9/10 Goal status: IN PROGRESS - 01/25/24 - pain essentially unchanged but shoulder feels looser  LONG TERM GOALS: Target date: 02/25/2024  Patient will be independent with ongoing/advanced HEP for self-management at home.  Baseline:  Goal status: IN PROGRESS  2.  Patient will report 50-75% improvement in R shoulder pain to improve QOL.  Baseline: 8-9/10 Goal status: IN PROGRESS - 01/19/24 - not much change from last visit   3.  Patient to demonstrate improved upright posture with posterior shoulder girdle engaged to promote improved glenohumeral joint mobility. Baseline:  Goal status: IN PROGRESS  4.  Patient to improve R shoulder AROM to [facility] without pain provocation to allow for increased ease of ADLs.  Baseline: Refer to above UE ROM table Goal status: IN PROGRESS - 01/25/24 - gains noted in B shoulder extension but no significant change in remaining motions  5.  Patient will demonstrate improved B shoulder and scapular strength to >/= 4 to 4+/5 for functional UE use. Baseline: Refer to above UE MMT table Goal status: IN PROGRESS  6  Patient will report </= 42% on QuickDASH to demonstrate improved functional ability.  Baseline: 52.3 / 100 = 52.3 % Goal status: IN PROGRESS   PLAN:  PT FREQUENCY: 2x/week  PT DURATION: 4-8 weeks  PLANNED INTERVENTIONS: 97164- PT Re-evaluation, 97110-Therapeutic exercises, 97530- Therapeutic activity, [id number]- Neuromuscular re-education, 97535- Self Care, 16109- Manual  therapy, [id number]- Electrical stimulation (unattended), [phone]- Electrical stimulation (manual), 97016- Vasopneumatic device, [id number]- Ultrasound, 09811- Ionotophoresis 4mg /ml Dexamethasone, Patient/Family education, Taping, Dry Needling, Joint mobilization, Spinal mobilization, Cryotherapy, and Moist heat  PLAN FOR NEXT SESSION:  MT including TPDN as indicated to address abnormal muscle tension in R shoulder complex; progress postural awareness/strengthening; gently R shoulder A/AAROM focusing on proper scapulohumeral kinematics; initiate gentle RTC strengthening; modalities PRN for pain management, continue with stabilization R shoulder   Marry Guan, PT 01/25/2024, 1:35 PM

## 2024-01-26 ENCOUNTER — Ambulatory Visit (HOSPITAL_BASED_OUTPATIENT_CLINIC_OR_DEPARTMENT_OTHER)

## 2024-01-26 ENCOUNTER — Encounter (HOSPITAL_BASED_OUTPATIENT_CLINIC_OR_DEPARTMENT_OTHER): Payer: Self-pay

## 2024-01-28 ENCOUNTER — Encounter: Payer: Self-pay | Admitting: Physical Therapy

## 2024-01-28 ENCOUNTER — Ambulatory Visit: Payer: 59 | Admitting: Physical Therapy

## 2024-01-28 DIAGNOSIS — R293 Abnormal posture: Secondary | ICD-10-CM

## 2024-01-28 DIAGNOSIS — M6281 Muscle weakness (generalized): Secondary | ICD-10-CM

## 2024-01-28 DIAGNOSIS — M62838 Other muscle spasm: Secondary | ICD-10-CM

## 2024-01-28 DIAGNOSIS — G8929 Other chronic pain: Secondary | ICD-10-CM

## 2024-01-28 DIAGNOSIS — M25611 Stiffness of right shoulder, not elsewhere classified: Secondary | ICD-10-CM

## 2024-01-28 DIAGNOSIS — M25511 Pain in right shoulder: Secondary | ICD-10-CM | POA: Diagnosis not present

## 2024-01-28 NOTE — Therapy (Signed)
 OUTPATIENT PHYSICAL THERAPY TREATMENT   Patient Name: Robin Schmidt MRN: [id number] DOB:[birth year], 58 y.o., female Today's Date: 01/28/2024   END OF SESSION:  PT End of Session - 01/28/24 1023     Visit Number 7    Date for PT Re-Evaluation 02/25/24    Authorization Type [employer] Dual Complete    PT Start Time 1023   Pt arrived late   PT Stop Time 1111    PT Time Calculation (min) 48 min    Activity Tolerance Patient tolerated treatment well;Patient limited by fatigue    Behavior During Therapy [facility] for tasks assessed/performed                 Past Medical History:  Diagnosis Date   Anxiety    Back pain, chronic    Depression    Past Surgical History:  Procedure Laterality Date   ANTERIOR CERVICAL DECOMP/DISCECTOMY FUSION N/A 11/22/2013   Procedure: ANTERIOR CERVICAL DECOMPRESSION/DISCECTOMY FUSION 1 LEVEL;  Surgeon: Emilee Hero, MD;  Location: [facility] OR;  Service: Orthopedics;  Laterality: N/A;  Anterior cervical decompression fusion, cervical 5-6 with instrumentation and allograft   back fusion     lumbar   BACK SURGERY     KNEE ARTHROSCOPY     LAPAROSCOPIC GASTRIC SLEEVE RESECTION     2015   ROTATOR CUFF REPAIR Right 12/2022   TUBAL LIGATION     Patient Active Problem List   Diagnosis Date Noted   Obesity (BMI 30-39.9) 11/17/2023   Full thickness rotator cuff tear 11/19/2022   Tinnitus, right 07/08/2017   Carpal tunnel syndrome 07/17/2014   Obstructive sleep apnea 07/17/2014   Radiculopathy 11/22/2013   Menometrorrhagia 06/08/2012    Class: Chronic   Chronic lower back pain 04/01/2007    PCP: Alfredia Ferguson, PA-C   REFERRING PROVIDER: Beverely Low, MD   REFERRING DIAG: M25.511 (ICD-10-CM) -  Pain in right shoulder  THERAPY DIAG:  Chronic right shoulder pain  Abnormal posture  Stiffness of right shoulder, not elsewhere classified  Muscle weakness (generalized)  Other muscle spasm  RATIONALE FOR EVALUATION AND TREATMENT:  Rehabilitation  ONSET DATE: Chronic  NEXT MD VISIT: 02/02/24   SUBJECTIVE:                                                                                                                                                                                                         SUBJECTIVE STATEMENT: Pt reports reports she didn't sleep very well last night and her pain is more elevated.  She is scheduled for her cervical CT  and MRI on Sunday, 01/30/2024.  EVAL:  Pt reports ongoing pain upper lateral shoulder since her R shoulder surgery in 12/2022 which causes her shoulder to lock up. Also notes recently diagnosed with B CTS and pinched nerves in her neck. When she sleeps on her R side her hand goes numb.  She notes her arm gets weak when her shoulder "locks up" while trying to do her exercises.  PAIN: Are you having pain? Yes: NPRS scale: 8/10  Pain location: R upper lateral shoulder Pain description: sharp dull ache  Aggravating factors: increased activity  Relieving factors: muscle relaxant, heat > cold   PERTINENT HISTORY:  R shoulder RCR 12/14/22, C5-6 ACDF 2015, Cervical radiculopathy, R CTS, L shoulder surgery - SAD/DCR 08/06/15, Chronic back pain, Lumbar fusion, Anxiety, Depression, OSA  PRECAUTIONS: None  RED FLAGS: None  HAND DOMINANCE: Right  WEIGHT BEARING RESTRICTIONS: No  FALLS:  Has patient fallen in last 6 months? No  LIVING ENVIRONMENT: Lives with: lives with their family Lives in: House/apartment Stairs: Yes: Internal: 14 steps; on right going up and External: 1 steps; none Has following equipment at home: Single point cane  OCCUPATION: On disability  PLOF: Independent with household mobility without device, Independent with gait, Independent with transfers, Needs assistance with ADLs, Needs assistance with homemaking, and Leisure: mostly sedentary due to depression and cold weather   PATIENT GOALS: "To be more flexible and pain free if possible."   OBJECTIVE:  (objective measures completed at initial evaluation unless otherwise dated)  DIAGNOSTIC FINDINGS:  12/22/2023 - EMG nerve conduction study reviewed with the patient showing evidence of a cervical radiculopathy as well as right hand carpal tunnel syndrome, mild with no motor nerve involvement. She has no evidence of left carpal tunnel syndrome on nerve conduction study. The cervical radiculopathy is concerning. Patient has seen Dr. Yevette Edwards over at [facility] ortho and is planning on seeing him to review options for the neck given the ongoing cervical radiculopathy noted on the EMG/NCS here. Recommend right hand CTR here in office using WALANT technique. Patient agrees, referral made.   PATIENT SURVEYS:  Quick Dash 52.3 / 100 = 52.3 %; limitations also potentially impacted by cervical radiculopathy and B CTS  COGNITION: Overall cognitive status: Within functional limits for tasks assessed     SENSATION: Intermittent numbness and tingling in radial side of hand, wrist and forearm  POSTURE: rounded shoulders, forward head, decreased lumbar lordosis, increased thoracic kyphosis, and R shoulder depressed relative to L  CERVICAL ROM:   Active ROM Eval 01/25/24  Flexion 40 36  Extension 28 27  Right lateral flexion 28 36  Left lateral flexion 21 19  Right rotation 42 50  Left rotation 51 48   (Blank rows = not tested)  UPPER EXTREMITY ROM:   Active ROM Right eval Left eval R 01/25/24 L 01/25/24  Shoulder flexion 121 142 119 136  Shoulder extension 25 34 35 45  Shoulder abduction 107 160 105 130  Shoulder adduction      Shoulder internal rotation FIR lateral lower ribs FIR T10 L2 L1  Shoulder external rotation FER T2 FER T4 T2 T3  Elbow flexion      Elbow extension      Wrist flexion      Wrist extension      Wrist ulnar deviation      Wrist radial deviation      Wrist pronation      Wrist supination      (Blank rows =  not tested)  UPPER EXTREMITY MMT:  MMT Right eval Left eval   Shoulder flexion 4- 4  Shoulder extension 4- 4  Shoulder abduction 4- 4-  Shoulder adduction    Shoulder internal rotation 4 4+  Shoulder external rotation 4- 4+  Middle trapezius 4- 4  Lower trapezius 3- 3+  Elbow flexion 5 4+  Elbow extension 5 4-  Wrist flexion    Wrist extension    Wrist ulnar deviation    Wrist radial deviation    Wrist pronation    Wrist supination    Grip strength (lbs) 42# 45#  (Blank rows = not tested)  SHOULDER SPECIAL TESTS: Impingement tests: Neer impingement test: positive  and Hawkins/Kennedy impingement test: positive  Rotator cuff assessment: Empty can test: positive  and Full can test: negative  JOINT MOBILITY TESTING:  R shoulder restricted in all planes  PALPATION:  Increased muscle tension and TTP in R UT, LS, all RTC muscles, lats, pecs     TODAY'S TREATMENT:   01/28/2024  THERAPEUTIC EXERCISE: To improve strength, endurance, and ROM.  Demonstration, verbal and tactile cues throughout for technique.  UBE - L2.0 x 6 min (3' each fwd & back) Seated scap retraction into pool noodle + B shoulder scaption 2 x 10, 2nd set with YTB - fatigues quickly requiring reps 3 x 3 + 1 to complete 10 reps Seated scap retraction into pool noodle + B shoulder ER x 2 x 10, 2nd set with YTB - fatigues quickly requiring reps 2 x 5 to complete 10 reps  NEUROMUSCULAR RE-EDUCATION: To improve coordination, kinesthesia, and posture. Seated R shoulder rhythmic stabilization at ~90 flexion 2 x 15 sec - arm dropping during attempts Standing R shoulder rhythmic stabilization with fist on ball on wall at ~90 flexion and scaption 2 x 15 sec each, quick taps by therapist all planes with goal of maintaining stable position/decreased movement R shoulder Standing R shoulder CW/CCW circles with fist on ball on wall at 90 flexion and scaption 2 x 10 each   MODALITIES:  Moist heat pack to R shoulder x 10 min   01/25/2024  THERAPEUTIC EXERCISE: To improve strength,  endurance, ROM, and flexibility.  Demonstration, verbal and tactile cues throughout for technique.  UBE - L1.5 x 6 min (3' each fwd & back) R posterior capsule stretch 2 x 30" - cues for proper alignment and height of shoulder Lower trap setting at wall - "V" wall slide + slight lift-off x 10  MANUAL THERAPY: To promote normalized muscle tension, improved flexibility, increased ROM, and reduced pain. Trigger Point Dry Needling: Treatment instructions/education: Subsequent Treatment: Instructions provided previously at initial dry needling treatment.  Instructions reviewed, if requested by the patient, prior to subsequent dry needling treatment.  Education Handout Provided: Previously Provided Consent: Patient Verbal Consent Given: Yes Treatment: Muscles Treated: R infraspinatus, R posterior deltoid, R UT, R subscapularis Skilled palpation and monitoring of soft tissue during DN Electrical Stimulation Performed: No Treatment Response/Outcome: Twitch response elicited, Palpable increase in muscle length, Decreased tissue resistance noted, and Decreased TTP STM/DTM, manual TPR and pin & stretch to muscles addressed with DN   01/19/24 NEUROMUSCULAR RE-EDUCATION: To improve coordination, kinesthesia, posture, and proprioception.  UBE L1.0 3 min fwd and 3 min back Seated thoracic ext in chair 10x3" Prone rows x 15 Prone Y raises x 15 Prone shoulder extension x 15 Serratus punches 1lb x 20 Supine rhythmic stab 2x MANUAL THERAPY: To promote normalized muscle tension, improved flexibility, improved joint  mobility, increased ROM, and reduced pain. STM to R infraspinatus, teres minor, UT,LS   PATIENT EDUCATION:  Education details: HEP review and postural awareness  Person educated: Patient Education method: Explanation, Demonstration, and Verbal cues Education comprehension: verbalized understanding, returned demonstration, verbal cues required, and needs further education  HOME EXERCISE  PROGRAM: Access Code: [email or web address] URL: [email or web address] Date: 01/28/2024 Prepared by: Glenetta Hew  Exercises - Seated Scapular Retraction with External Rotation  - 2 x daily - 7 x weekly - 2 sets - 10 reps - 3 sec hold - Seated Thoracic Lumbar Extension  - 2 x daily - 7 x weekly - 2 sets - 10 reps - 3 sec hold - Open Book Chest Stretch on Towel Roll  - 1 x daily - 7 x weekly - 2 sets - 10 reps - 3 sec hold - Supine Shoulder Horizontal Abduction with Resistance  - 1 x daily - 3-4 x weekly - 2 sets - 10 reps - 3 sec hold - Standing Shoulder Posterior Capsule Stretch  - 1-2 x daily - 7 x weekly - 3 reps - 30 sec hold - Low Trap Setting at Wall  - 1 x daily - 7 x weekly - 2 sets - 10 reps - 3 sec hold - Prone Single Arm Shoulder Horizontal Abduction with Scapular Retraction and Palm Down  - 1 x daily - 7 x weekly - 2 sets - 10 reps - 3 sec hold - Prone Shoulder Extension - Single Arm  - 1 x daily - 7 x weekly - 2 sets - 10 reps - 3 sec hold - Prone Single Arm Shoulder Y  - 1 x daily - 7 x weekly - 2 sets - 10 reps - 3 sec hold  Patient Education - Trigger Point Dry Needling  Access Code from prior PT episode: [email or web address]    ASSESSMENT:  CLINICAL IMPRESSION: Kachina reports increased pain today after sleeping poorly last night.  Very pronounced rounded forward shoulder posture demonstrated today therefore provided verbal and tactile cues for postural awareness and scapular engagement during ROM and strengthening exercises.  She continues to fatigue very quickly during all exercises requiring small reps/sets in order to maintain desired posture and muscle activation.  She is scheduled for a cervical CT and MRI on Sunday, 01/30/2024 to further evaluate her cervical radiculopathy which may be contributing to some of her issues related to her R shoulder.  She is scheduled to follow-up with Dr. Ranell Patrick on 02/02/2024, therefore will plan to further assess progress with PT next visit in  preparation for progress note for MD visit.  Mafalda will benefit from continued skilled PT to address ongoing deficits and functional impairments to improve mobility and activity tolerance with decreased pain interference.   EVAL: CARIE KAPUSCINSKI is a 58 y.o. female who was referred to physical therapy for evaluation and treatment for chronic R shoulder pain.  Recent EMG/NCV showing evidence of cervical radiculopathy and R carpal tunnel syndrome for which she is pending follow-up with her neurosurgeon, Dr. Yevette Edwards over at [facility], in relation to the cervical radiculopathy and will be scheduled for carpal tunnel release with Dr. Ranell Patrick.  Patient reports ongoing R pain since R RCR in 12/2022. Pain is worse with lying on R side and lifting, and patient notes progressive weakness as well as increased pain with any increased in activity.  Patient has deficits in cervicsl and R shoulder ROM, cervical and postural flexibility, R>L shoulder/UE strength, abnormal posture, and TTP with abnormal  muscle tension t/o R shoulder complex which are interfering with ADLs and are impacting quality of life.  On QuickDASH patient scored 52.3/100 demonstrating 52.3% disability.  Aashika will benefit from skilled PT to address above deficits to improve mobility and activity tolerance with decreased pain interference.  OBJECTIVE IMPAIRMENTS: decreased activity tolerance, decreased endurance, decreased knowledge of condition, decreased mobility, decreased ROM, decreased strength, hypomobility, increased fascial restrictions, impaired perceived functional ability, increased muscle spasms, impaired flexibility, impaired sensation, impaired UE functional use, improper body mechanics, postural dysfunction, and pain.   ACTIVITY LIMITATIONS: carrying, lifting, sleeping, bed mobility, bathing, dressing, reach over head, hygiene/grooming, and caring for others  PARTICIPATION LIMITATIONS: meal prep, cleaning, laundry, shopping, and  community activity  PERSONAL FACTORS: Behavior pattern, Fitness, Past/current experiences, Time since onset of injury/illness/exacerbation, and 3+ comorbidities: R shoulder RCR 12/14/22, C5-6 ACDF 2015, Cervical radiculopathy, R CTS, L shoulder surgery - SAD/DCR 08/06/15, Chronic back pain, Lumbar fusion, Anxiety, Depression, OSA  are also affecting patient's functional outcome.   REHAB POTENTIAL: Good  CLINICAL DECISION MAKING: Unstable/unpredictable  EVALUATION COMPLEXITY: High   GOALS: Goals reviewed with patient? Yes  SHORT TERM GOALS: Target date: 01/21/2024  Patient will be independent with initial HEP to improve outcomes and carryover.  Baseline:  Goal status: MET - 01/25/24    2.  Patient will report 25% improvement in R shoulder pain to improve QOL.   Baseline: 8-9/10 Goal status: IN PROGRESS - 01/25/24 - pain essentially unchanged but shoulder feels looser  LONG TERM GOALS: Target date: 02/25/2024  Patient will be independent with ongoing/advanced HEP for self-management at home.  Baseline:  Goal status: IN PROGRESS  2.  Patient will report 50-75% improvement in R shoulder pain to improve QOL.  Baseline: 8-9/10 Goal status: IN PROGRESS - 01/19/24 - not much change from last visit   3.  Patient to demonstrate improved upright posture with posterior shoulder girdle engaged to promote improved glenohumeral joint mobility. Baseline:  Goal status: IN PROGRESS  4.  Patient to improve R shoulder AROM to [facility] without pain provocation to allow for increased ease of ADLs.  Baseline: Refer to above UE ROM table Goal status: IN PROGRESS - 01/25/24 - gains noted in B shoulder extension but no significant change in remaining motions  5.  Patient will demonstrate improved B shoulder and scapular strength to >/= 4 to 4+/5 for functional UE use. Baseline: Refer to above UE MMT table Goal status: IN PROGRESS  6  Patient will report </= 42% on QuickDASH to demonstrate improved functional  ability.  Baseline: 52.3 / 100 = 52.3 % Goal status: IN PROGRESS   PLAN:  PT FREQUENCY: 2x/week  PT DURATION: 4-8 weeks  PLANNED INTERVENTIONS: 97164- PT Re-evaluation, 97110-Therapeutic exercises, 97530- Therapeutic activity, 97112- Neuromuscular re-education, 97535- Self Care, 16109- Manual therapy, [id number]- Electrical stimulation (unattended), [phone]- Electrical stimulation (manual), 97016- Vasopneumatic device, [id number]- Ultrasound, 09811- Ionotophoresis 4mg /ml Dexamethasone, Patient/Family education, Taping, Dry Needling, Joint mobilization, Spinal mobilization, Cryotherapy, and Moist heat  PLAN FOR NEXT SESSION:  Goal assessment and MD PN for appt on 02/02/24; MT including TPDN as indicated to address abnormal muscle tension in R shoulder complex; progress postural awareness/strengthening; gently R shoulder A/AAROM focusing on proper scapulohumeral kinematics; initiate gentle RTC strengthening; modalities PRN for pain management, continue with stabilization R shoulder   Marry Guan, PT 01/28/2024, 11:34 AM

## 2024-01-30 ENCOUNTER — Ambulatory Visit (HOSPITAL_BASED_OUTPATIENT_CLINIC_OR_DEPARTMENT_OTHER)
Admission: RE | Admit: 2024-01-30 | Discharge: 2024-01-30 | Disposition: A | Discharge status: Home / Self Care | Source: Ambulatory Visit | Attending: Orthopedic Surgery | Admitting: Orthopedic Surgery

## 2024-01-30 ENCOUNTER — Ambulatory Visit (HOSPITAL_BASED_OUTPATIENT_CLINIC_OR_DEPARTMENT_OTHER)

## 2024-01-30 DIAGNOSIS — Z981 Arthrodesis status: Secondary | ICD-10-CM | POA: Diagnosis not present

## 2024-01-30 DIAGNOSIS — M542 Cervicalgia: Secondary | ICD-10-CM | POA: Insufficient documentation

## 2024-01-30 DIAGNOSIS — M47812 Spondylosis without myelopathy or radiculopathy, cervical region: Secondary | ICD-10-CM | POA: Diagnosis not present

## 2024-02-01 ENCOUNTER — Encounter: Payer: Self-pay | Admitting: Physical Therapy

## 2024-02-01 ENCOUNTER — Ambulatory Visit: Admitting: Physical Therapy

## 2024-02-01 DIAGNOSIS — R293 Abnormal posture: Secondary | ICD-10-CM | POA: Diagnosis not present

## 2024-02-01 DIAGNOSIS — M25511 Pain in right shoulder: Secondary | ICD-10-CM | POA: Diagnosis not present

## 2024-02-01 DIAGNOSIS — M62838 Other muscle spasm: Secondary | ICD-10-CM

## 2024-02-01 DIAGNOSIS — M6281 Muscle weakness (generalized): Secondary | ICD-10-CM

## 2024-02-01 DIAGNOSIS — M25611 Stiffness of right shoulder, not elsewhere classified: Secondary | ICD-10-CM

## 2024-02-01 DIAGNOSIS — G8929 Other chronic pain: Secondary | ICD-10-CM | POA: Diagnosis not present

## 2024-02-01 NOTE — Therapy (Signed)
 OUTPATIENT PHYSICAL THERAPY TREATMENT  Progress Note  Reporting Period 12/31/23 to 02/01/2024   See note below for Objective Data and Assessment of Progress/Goals.     Patient Name: Robin Schmidt MRN: [id number] DOB:1966/10/14, 59 y.o., female Today's Date: 02/01/2024   END OF SESSION:  PT End of Session - 02/01/24 1318     Visit Number 8    Date for PT Re-Evaluation 02/25/24    Authorization Type [employer] Dual Complete    PT Start Time 1318    PT Stop Time 1415    PT Time Calculation (min) 57 min    Activity Tolerance Patient tolerated treatment well;Patient limited by fatigue    Behavior During Therapy [facility] for tasks assessed/performed                  Past Medical History:  Diagnosis Date   Anxiety    Back pain, chronic    Depression    Past Surgical History:  Procedure Laterality Date   ANTERIOR CERVICAL DECOMP/DISCECTOMY FUSION N/A 11/22/2013   Procedure: ANTERIOR CERVICAL DECOMPRESSION/DISCECTOMY FUSION 1 LEVEL;  Surgeon: Emilee Hero, MD;  Location: [facility] OR;  Service: Orthopedics;  Laterality: N/A;  Anterior cervical decompression fusion, cervical 5-6 with instrumentation and allograft   back fusion     lumbar   BACK SURGERY     KNEE ARTHROSCOPY     LAPAROSCOPIC GASTRIC SLEEVE RESECTION     2015   ROTATOR CUFF REPAIR Right 12/2022   TUBAL LIGATION     Patient Active Problem List   Diagnosis Date Noted   Obesity (BMI 30-39.9) 11/17/2023   Full thickness rotator cuff tear 11/19/2022   Tinnitus, right 07/08/2017   Carpal tunnel syndrome 07/17/2014   Obstructive sleep apnea 07/17/2014   Radiculopathy 11/22/2013   Menometrorrhagia 06/08/2012    Class: Chronic   Chronic lower back pain 04/01/2007    PCP: Alfredia Ferguson, PA-C   REFERRING PROVIDER: Beverely Low, MD   REFERRING DIAG: M25.511 (ICD-10-CM) -  Pain in right shoulder  THERAPY DIAG:  Chronic right shoulder pain  Abnormal posture  Stiffness of right shoulder, not elsewhere  classified  Muscle weakness (generalized)  Other muscle spasm  RATIONALE FOR EVALUATION AND TREATMENT: Rehabilitation  ONSET DATE: Chronic  NEXT MD VISIT: 02/02/24   SUBJECTIVE:                                                                                                                                                                                                         SUBJECTIVE STATEMENT: Pt reports she had  her cervical CT on Sunday, 01/30/2024 but had to reschedule her MRI for next Sunday due to the MRI machine being down.  She reports difficulty with trying to sleep on either side due to shoulder pain as well as her carpal tunnel symptoms.  EVAL:  Pt reports ongoing pain upper lateral shoulder since her R shoulder surgery in 12/2022 which causes her shoulder to lock up. Also notes recently diagnosed with B CTS and pinched nerves in her neck. When she sleeps on her R side her hand goes numb.  She notes her arm gets weak when her shoulder "locks up" while trying to do her exercises.  PAIN: Are you having pain? Yes: NPRS scale: 8/10  Pain location: R upper lateral shoulder & down lateral upper arm  Pain description: ongoing sharp dull ache  Aggravating factors: increased activity  Relieving factors: muscle relaxant, heat > cold   PERTINENT HISTORY:  R shoulder RCR 12/14/22, C5-6 ACDF 2015, Cervical radiculopathy, R CTS, L shoulder surgery - SAD/DCR 08/06/15, Chronic back pain, Lumbar fusion, Anxiety, Depression, OSA  PRECAUTIONS: None  RED FLAGS: None  HAND DOMINANCE: Right  WEIGHT BEARING RESTRICTIONS: No  FALLS:  Has patient fallen in last 6 months? No  LIVING ENVIRONMENT: Lives with: lives with their family Lives in: House/apartment Stairs: Yes: Internal: 14 steps; on right going up and External: 1 steps; none Has following equipment at home: Single point cane  OCCUPATION: On disability  PLOF: Independent with household mobility without device, Independent with  gait, Independent with transfers, Needs assistance with ADLs, Needs assistance with homemaking, and Leisure: mostly sedentary due to depression and cold weather   PATIENT GOALS: "To be more flexible and pain free if possible."   OBJECTIVE: (objective measures completed at initial evaluation unless otherwise dated)  DIAGNOSTIC FINDINGS:  12/22/2023 - EMG nerve conduction study reviewed with the patient showing evidence of a cervical radiculopathy as well as right hand carpal tunnel syndrome, mild with no motor nerve involvement. She has no evidence of left carpal tunnel syndrome on nerve conduction study. The cervical radiculopathy is concerning. Patient has seen Dr. Yevette Edwards over at [facility] ortho and is planning on seeing him to review options for the neck given the ongoing cervical radiculopathy noted on the EMG/NCS here. Recommend right hand CTR here in office using WALANT technique. Patient agrees, referral made.   PATIENT SURVEYS:  Quick Dash 52.3 / 100 = 52.3 %; limitations also potentially impacted by cervical radiculopathy and B CTS 02/01/24: QuickDASH 65.9 / 100 = 65.9 %  COGNITION: Overall cognitive status: Within functional limits for tasks assessed     SENSATION: Intermittent numbness and tingling in radial side of hand, wrist and forearm  POSTURE: rounded shoulders, forward head, decreased lumbar lordosis, increased thoracic kyphosis, and R shoulder depressed relative to L  CERVICAL ROM:   Active ROM Eval 01/25/24  Flexion 40 36  Extension 28 27  Right lateral flexion 28 36  Left lateral flexion 21 19  Right rotation 42 50  Left rotation 51 48   (Blank rows = not tested)  UPPER EXTREMITY ROM:   Active ROM Right eval Left eval R 01/25/24 L 01/25/24 R 02/01/24 L 02/01/24  Shoulder flexion 121 142 119 136 121 151  Shoulder extension 25 34 35 45 41 41  Shoulder abduction 107 160 105 130 138 ^ 155  Shoulder adduction        Shoulder internal rotation FIR lateral lower ribs  FIR T10 L2 L1 L1 T12  Shoulder  external rotation FER T2 FER T4 T2 T3 T3 T3  Elbow flexion        Elbow extension        Wrist flexion        Wrist extension        Wrist ulnar deviation        Wrist radial deviation        Wrist pronation        Wrist supination        (Blank rows = not tested, ^ = increased pain)  UPPER EXTREMITY MMT:  MMT Right eval Left eval R 02/01/24 L 02/01/24  Shoulder flexion 4- 4 4 4   Shoulder extension 4- 4 4+ 4+  Shoulder abduction 4- 4- 4- 4  Shoulder adduction      Shoulder internal rotation 4 4+ 4+ 4+  Shoulder external rotation 4- 4+ 4 4+  Middle trapezius 4- 4 4- 4  Lower trapezius 3- 3+ 3- 3  Elbow flexion 4+ 5 5 5   Elbow extension 4- 5 4 5   Wrist flexion      Wrist extension      Wrist ulnar deviation      Wrist radial deviation      Wrist pronation      Wrist supination      Grip strength (lbs) 42# 45#    (Blank rows = not tested)  SHOULDER SPECIAL TESTS: Impingement tests: Neer impingement test: positive  and Hawkins/Kennedy impingement test: positive  Rotator cuff assessment: Empty can test: positive  and Full can test: negative  JOINT MOBILITY TESTING:  R shoulder restricted in all planes  PALPATION:  Increased muscle tension and TTP in R UT, LS, all RTC muscles, lats, pecs     TODAY'S TREATMENT:   02/01/2024 THERAPEUTIC EXERCISE: To improve strength and endurance.  Demonstration, verbal and tactile cues throughout for technique.  UBE - L2.0 x 6 min (3' each fwd & back)  THERAPEUTIC ACTIVITIES: To improve functional performance.  Demonstration, verbal and tactile cues throughout for technique.  Shoulder ROM assessment UE MMT QuickDASH: 65.9 / 100 = 65.9 % Goal assessment  NEUROMUSCULAR RE-EDUCATION: To improve coordination, kinesthesia, posture, and proprioception.  Lower trap setting at wall - "V" wall slide + slight lift-off x 10 - PT providing verbal and tactile cues for scapular engagement/desired movement pattern for  lower trap activation Scap retraction + depression - YTB draped over shoulders with band crossed behind back - hands pressing down and slightly out 2 x 10  MODALITIES:  Moist heat pack to R shoulder x 10 min   01/28/2024  THERAPEUTIC EXERCISE: To improve strength, endurance, and ROM.  Demonstration, verbal and tactile cues throughout for technique.  UBE - L2.0 x 6 min (3' each fwd & back) Seated scap retraction into pool noodle + B shoulder scaption 2 x 10, 2nd set with YTB - fatigues quickly requiring reps 3 x 3 + 1 to complete 10 reps Seated scap retraction into pool noodle + B shoulder ER x 2 x 10, 2nd set with YTB - fatigues quickly requiring reps 2 x 5 to complete 10 reps  NEUROMUSCULAR RE-EDUCATION: To improve coordination, kinesthesia, and posture. Seated R shoulder rhythmic stabilization at ~90 flexion 2 x 15 sec - arm dropping during attempts Standing R shoulder rhythmic stabilization with fist on ball on wall at ~90 flexion and scaption 2 x 15 sec each, quick taps by therapist all planes with goal of maintaining stable position/decreased movement R shoulder Standing R  shoulder CW/CCW circles with fist on ball on wall at 90 flexion and scaption 2 x 10 each   MODALITIES:  Moist heat pack to R shoulder x 10 min   01/25/2024  THERAPEUTIC EXERCISE: To improve strength, endurance, ROM, and flexibility.  Demonstration, verbal and tactile cues throughout for technique.  UBE - L1.5 x 6 min (3' each fwd & back) R posterior capsule stretch 2 x 30" - cues for proper alignment and height of shoulder Lower trap setting at wall - "V" wall slide + slight lift-off x 10  MANUAL THERAPY: To promote normalized muscle tension, improved flexibility, increased ROM, and reduced pain. Trigger Point Dry Needling: Treatment instructions/education: Subsequent Treatment: Instructions provided previously at initial dry needling treatment.  Instructions reviewed, if requested by the patient, prior to  subsequent dry needling treatment.  Education Handout Provided: Previously Provided Consent: Patient Verbal Consent Given: Yes Treatment: Muscles Treated: R infraspinatus, R posterior deltoid, R UT, R subscapularis Skilled palpation and monitoring of soft tissue during DN Electrical Stimulation Performed: No Treatment Response/Outcome: Twitch response elicited, Palpable increase in muscle length, Decreased tissue resistance noted, and Decreased TTP STM/DTM, manual TPR and pin & stretch to muscles addressed with DN   PATIENT EDUCATION:  Education details: progress with PT, ongoing PT POC, and postural awareness  Person educated: Patient Education method: Explanation, Demonstration, and Verbal cues Education comprehension: verbalized understanding, returned demonstration, verbal cues required, and needs further education  HOME EXERCISE PROGRAM: Access Code: [email or web address] URL: [email or web address] Date: 02/01/2024 Prepared by: Glenetta Hew  Exercises - Seated Scapular Retraction with External Rotation  - 2 x daily - 7 x weekly - 2 sets - 10 reps - 3 sec hold - Seated Thoracic Lumbar Extension  - 2 x daily - 7 x weekly - 2 sets - 10 reps - 3 sec hold - Open Book Chest Stretch on Towel Roll  - 1 x daily - 7 x weekly - 2 sets - 10 reps - 3 sec hold - Supine Shoulder Horizontal Abduction with Resistance  - 1 x daily - 3-4 x weekly - 2 sets - 10 reps - 3 sec hold - Standing Shoulder Posterior Capsule Stretch  - 1-2 x daily - 7 x weekly - 3 reps - 30 sec hold - Low Trap Setting at Wall  - 1 x daily - 7 x weekly - 2 sets - 10 reps - 3 sec hold - Prone Single Arm Shoulder Horizontal Abduction with Scapular Retraction and Palm Down  - 1 x daily - 7 x weekly - 2 sets - 10 reps - 3 sec hold - Prone Shoulder Extension - Single Arm  - 1 x daily - 7 x weekly - 2 sets - 10 reps - 3 sec hold - Prone Single Arm Shoulder Y  - 1 x daily - 7 x weekly - 2 sets - 10 reps - 3 sec hold - Staggered  Stance Scapular Retraction and Depression with Resistance  - 1 x daily - 7 x weekly - 2 sets - 10 reps - 3 sec hold  Patient Education - Trigger Point Dry Needling  Access Code from prior PT episode: [email or web address]    ASSESSMENT:  CLINICAL IMPRESSION: Robin Schmidt reports her pain levels remain unchanged since start of PT and the QuickDASH reveals decline in perceived function from 52.3% disability to 65.9% disability, however difficult to differentiate degree of limitation related to her R shoulder vs the cervical radiculopathy or her B CTS.  R shoulder ROM has improved  in all planes except flexion but high pain levels still limiting overhead and behind the back motion.  Slight improvement in strength noted in some muscle groups in B shoulders however fatigue remains a significant issue with functional activity and exercise tolerance.  She persists with forward head and rounded shoulder posture with increased postural kyphosis which likely contributes to her cervical radiculopathy as well as creates increased risk for shoulder impingement with overhead ROM.  Only limited progress observed toward PT goals, with only STG #1 met at this time.  Robin Schmidt continues to have potential to benefit from continued skilled PT to address ongoing deficits and functional impairments to improve mobility and activity tolerance with decreased pain interference, however if further gains not observed by end of current POC may have to consider discharge.  EVAL: Robin Schmidt is a 58 y.o. female who was referred to physical therapy for evaluation and treatment for chronic R shoulder pain.  Recent EMG/NCV showing evidence of cervical radiculopathy and R carpal tunnel syndrome for which she is pending follow-up with her neurosurgeon, Dr. Yevette Edwards over at [facility], in relation to the cervical radiculopathy and will be scheduled for carpal tunnel release with Dr. Ranell Patrick.  Patient reports ongoing R pain since R RCR in 12/2022. Pain is  worse with lying on R side and lifting, and patient notes progressive weakness as well as increased pain with any increased in activity.  Patient has deficits in cervicsl and R shoulder ROM, cervical and postural flexibility, R>L shoulder/UE strength, abnormal posture, and TTP with abnormal muscle tension t/o R shoulder complex which are interfering with ADLs and are impacting quality of life.  On QuickDASH patient scored 52.3/100 demonstrating 52.3% disability.  Robin Schmidt will benefit from skilled PT to address above deficits to improve mobility and activity tolerance with decreased pain interference.  OBJECTIVE IMPAIRMENTS: decreased activity tolerance, decreased endurance, decreased knowledge of condition, decreased mobility, decreased ROM, decreased strength, hypomobility, increased fascial restrictions, impaired perceived functional ability, increased muscle spasms, impaired flexibility, impaired sensation, impaired UE functional use, improper body mechanics, postural dysfunction, and pain.   ACTIVITY LIMITATIONS: carrying, lifting, sleeping, bed mobility, bathing, dressing, reach over head, hygiene/grooming, and caring for others  PARTICIPATION LIMITATIONS: meal prep, cleaning, laundry, shopping, and community activity  PERSONAL FACTORS: Behavior pattern, Fitness, Past/current experiences, Time since onset of injury/illness/exacerbation, and 3+ comorbidities: R shoulder RCR 12/14/22, C5-6 ACDF 2015, Cervical radiculopathy, R CTS, L shoulder surgery - SAD/DCR 08/06/15, Chronic back pain, Lumbar fusion, Anxiety, Depression, OSA  are also affecting patient's functional outcome.   REHAB POTENTIAL: Good  CLINICAL DECISION MAKING: Unstable/unpredictable  EVALUATION COMPLEXITY: High   GOALS: Goals reviewed with patient? Yes  SHORT TERM GOALS: Target date: 01/21/2024  Patient will be independent with initial HEP to improve outcomes and carryover.  Baseline:  Goal status: MET - 01/25/24    2.   Patient will report 25% improvement in R shoulder pain to improve QOL.   Baseline: 8-9/10 Goal status: IN PROGRESS - 02/01/24 - pain essentially unchanged (some days better, some days worse), but shoulder feels looser  LONG TERM GOALS: Target date: 02/25/2024  Patient will be independent with ongoing/advanced HEP for self-management at home.  Baseline:  Goal status: IN PROGRESS - 02/01/24 - Pt reports daily performance of HEP but does experience increased pain at time and notes fatigue requiring small sets with frequent rest breaks  2.  Patient will report 50-75% improvement in R shoulder pain to improve QOL.  Baseline: 8-9/10 Goal status: IN  PROGRESS - 02/01/24 - pain essentially unchanged (some days better, some days worse), but shoulder feels looser  3.  Patient to demonstrate improved upright posture with posterior shoulder girdle engaged to promote improved glenohumeral joint mobility. Baseline:  Goal status: IN PROGRESS - 02/01/24 - Pt persists with fwd head and rounded shoulder posture with increased postural kyphosis  4.  Patient to improve R shoulder AROM to [facility] without pain provocation to allow for increased ease of ADLs.  Baseline: Refer to above UE ROM table Goal status: IN PROGRESS - 02/01/24 - R shoulder flexion unchanged but slight gains noted in remaining motions  5.  Patient will demonstrate improved B shoulder and scapular strength to >/= 4 to 4+/5 for functional UE use. Baseline: Refer to above UE MMT table Goal status: IN PROGRESS - 02/01/24 - slight gains in some motions in B shoulders but still significant weakness, esp in scapular muscles  6  Patient will report </= 42% on QuickDASH to demonstrate improved functional ability.  Baseline: 52.3 / 100 = 52.3 % Goal status: IN PROGRESS - 02/01/24 - 65.9 / 100 = 65.9 % - worsening function potentially related to cervical radiculopathy and carpal tunnel issues   PLAN:  PT FREQUENCY: 2x/week  PT DURATION: 4-8  weeks  PLANNED INTERVENTIONS: 97164- PT Re-evaluation, 97110-Therapeutic exercises, 97530- Therapeutic activity, 97112- Neuromuscular re-education, 97535- Self Care, [id number]- Manual therapy, [id number]- Electrical stimulation (unattended), [id number]- Electrical stimulation (manual), 97016- Vasopneumatic device, [id number]- Ultrasound, 47829- Ionotophoresis 4mg /ml Dexamethasone, Patient/Family education, Taping, Dry Needling, Joint mobilization, Spinal mobilization, Cryotherapy, and Moist heat  PLAN FOR NEXT SESSION:  MT including TPDN as indicated to address abnormal muscle tension in R shoulder complex; progress postural awareness/strengthening; gently R shoulder A/AAROM focusing on proper scapulohumeral kinematics; initiate gentle RTC strengthening; modalities PRN for pain management, continue with stabilization R shoulder   Marry Guan, PT 02/01/2024, 1:24 PM

## 2024-02-02 DIAGNOSIS — M75121 Complete rotator cuff tear or rupture of right shoulder, not specified as traumatic: Secondary | ICD-10-CM | POA: Diagnosis not present

## 2024-02-06 ENCOUNTER — Ambulatory Visit (HOSPITAL_BASED_OUTPATIENT_CLINIC_OR_DEPARTMENT_OTHER)
Admission: RE | Admit: 2024-02-06 | Discharge: 2024-02-06 | Disposition: A | Discharge status: Home / Self Care | Source: Ambulatory Visit | Attending: Orthopedic Surgery | Admitting: Orthopedic Surgery

## 2024-02-06 DIAGNOSIS — M542 Cervicalgia: Secondary | ICD-10-CM | POA: Diagnosis not present

## 2024-02-06 DIAGNOSIS — Z981 Arthrodesis status: Secondary | ICD-10-CM | POA: Diagnosis not present

## 2024-02-06 DIAGNOSIS — M47812 Spondylosis without myelopathy or radiculopathy, cervical region: Secondary | ICD-10-CM | POA: Diagnosis not present

## 2024-02-08 ENCOUNTER — Ambulatory Visit: Admitting: Physical Therapy

## 2024-02-11 ENCOUNTER — Ambulatory Visit: Attending: Orthopedic Surgery

## 2024-02-11 DIAGNOSIS — R293 Abnormal posture: Secondary | ICD-10-CM | POA: Insufficient documentation

## 2024-02-11 DIAGNOSIS — M25611 Stiffness of right shoulder, not elsewhere classified: Secondary | ICD-10-CM | POA: Insufficient documentation

## 2024-02-11 DIAGNOSIS — M62838 Other muscle spasm: Secondary | ICD-10-CM | POA: Insufficient documentation

## 2024-02-11 DIAGNOSIS — M25511 Pain in right shoulder: Secondary | ICD-10-CM | POA: Insufficient documentation

## 2024-02-11 DIAGNOSIS — G8929 Other chronic pain: Secondary | ICD-10-CM | POA: Insufficient documentation

## 2024-02-11 DIAGNOSIS — M6281 Muscle weakness (generalized): Secondary | ICD-10-CM | POA: Insufficient documentation

## 2024-02-14 DIAGNOSIS — M542 Cervicalgia: Secondary | ICD-10-CM | POA: Diagnosis not present

## 2024-02-15 ENCOUNTER — Ambulatory Visit

## 2024-02-15 DIAGNOSIS — G8929 Other chronic pain: Secondary | ICD-10-CM

## 2024-02-15 DIAGNOSIS — R293 Abnormal posture: Secondary | ICD-10-CM | POA: Diagnosis not present

## 2024-02-15 DIAGNOSIS — M25611 Stiffness of right shoulder, not elsewhere classified: Secondary | ICD-10-CM | POA: Diagnosis not present

## 2024-02-15 DIAGNOSIS — M25511 Pain in right shoulder: Secondary | ICD-10-CM | POA: Diagnosis not present

## 2024-02-15 DIAGNOSIS — M6281 Muscle weakness (generalized): Secondary | ICD-10-CM

## 2024-02-15 DIAGNOSIS — M62838 Other muscle spasm: Secondary | ICD-10-CM | POA: Diagnosis not present

## 2024-02-15 NOTE — Therapy (Signed)
 OUTPATIENT PHYSICAL THERAPY TREATMENT     Patient Name: KAYTE BORCHARD MRN: [id number] DOB:1966-08-11, 58 y.o., female Today's Date: 02/15/2024   END OF SESSION:  PT End of Session - 02/15/24 1101     Visit Number 9    Date for PT Re-Evaluation 02/25/24    Authorization Type [employer] Dual Complete    PT Start Time 1021   pt late   PT Stop Time 1058    PT Time Calculation (min) 37 min    Activity Tolerance Patient tolerated treatment well;Patient limited by pain    Behavior During Therapy [facility] for tasks assessed/performed                   Past Medical History:  Diagnosis Date   Anxiety    Back pain, chronic    Depression    Past Surgical History:  Procedure Laterality Date   ANTERIOR CERVICAL DECOMP/DISCECTOMY FUSION N/A 11/22/2013   Procedure: ANTERIOR CERVICAL DECOMPRESSION/DISCECTOMY FUSION 1 LEVEL;  Surgeon: Emilee Hero, MD;  Location: [facility] OR;  Service: Orthopedics;  Laterality: N/A;  Anterior cervical decompression fusion, cervical 5-6 with instrumentation and allograft   back fusion     lumbar   BACK SURGERY     KNEE ARTHROSCOPY     LAPAROSCOPIC GASTRIC SLEEVE RESECTION     2015   ROTATOR CUFF REPAIR Right 12/2022   TUBAL LIGATION     Patient Active Problem List   Diagnosis Date Noted   Obesity (BMI 30-39.9) 11/17/2023   Full thickness rotator cuff tear 11/19/2022   Tinnitus, right 07/08/2017   Carpal tunnel syndrome 07/17/2014   Obstructive sleep apnea 07/17/2014   Radiculopathy 11/22/2013   Menometrorrhagia 06/08/2012    Class: Chronic   Chronic lower back pain 04/01/2007    PCP: Alfredia Ferguson, PA-C   REFERRING PROVIDER: Beverely Low, MD   REFERRING DIAG: M25.511 (ICD-10-CM) -  Pain in right shoulder  THERAPY DIAG:  Chronic right shoulder pain  Abnormal posture  Stiffness of right shoulder, not elsewhere classified  Muscle weakness (generalized)  RATIONALE FOR EVALUATION AND TREATMENT: Rehabilitation  ONSET DATE:  Chronic  NEXT MD VISIT: 02/02/24   SUBJECTIVE:                                                                                                                                                                                                         SUBJECTIVE STATEMENT: Pt reports   EVAL:  Pt reports ongoing pain upper lateral shoulder since her R shoulder surgery in 12/2022 which causes her shoulder to  lock up. Also notes recently diagnosed with B CTS and pinched nerves in her neck. When she sleeps on her R side her hand goes numb.  She notes her arm gets weak when her shoulder "locks up" while trying to do her exercises.  PAIN: Are you having pain? Yes: NPRS scale: 8/10  Pain location: R upper lateral shoulder & down lateral upper arm  Pain description: ongoing sharp dull ache  Aggravating factors: increased activity  Relieving factors: muscle relaxant, heat > cold   PERTINENT HISTORY:  R shoulder RCR 12/14/22, C5-6 ACDF 2015, Cervical radiculopathy, R CTS, L shoulder surgery - SAD/DCR 08/06/15, Chronic back pain, Lumbar fusion, Anxiety, Depression, OSA  PRECAUTIONS: None  RED FLAGS: None  HAND DOMINANCE: Right  WEIGHT BEARING RESTRICTIONS: No  FALLS:  Has patient fallen in last 6 months? No  LIVING ENVIRONMENT: Lives with: lives with their family Lives in: House/apartment Stairs: Yes: Internal: 14 steps; on right going up and External: 1 steps; none Has following equipment at home: Single point cane  OCCUPATION: On disability  PLOF: Independent with household mobility without device, Independent with gait, Independent with transfers, Needs assistance with ADLs, Needs assistance with homemaking, and Leisure: mostly sedentary due to depression and cold weather   PATIENT GOALS: "To be more flexible and pain free if possible."   OBJECTIVE: (objective measures completed at initial evaluation unless otherwise dated)  DIAGNOSTIC FINDINGS:  12/22/2023 - EMG nerve conduction study  reviewed with the patient showing evidence of a cervical radiculopathy as well as right hand carpal tunnel syndrome, mild with no motor nerve involvement. She has no evidence of left carpal tunnel syndrome on nerve conduction study. The cervical radiculopathy is concerning. Patient has seen Dr. Yevette Edwards over at [facility] ortho and is planning on seeing him to review options for the neck given the ongoing cervical radiculopathy noted on the EMG/NCS here. Recommend right hand CTR here in office using WALANT technique. Patient agrees, referral made.   PATIENT SURVEYS:  Quick Dash 52.3 / 100 = 52.3 %; limitations also potentially impacted by cervical radiculopathy and B CTS 02/01/24: QuickDASH 65.9 / 100 = 65.9 %  COGNITION: Overall cognitive status: Within functional limits for tasks assessed     SENSATION: Intermittent numbness and tingling in radial side of hand, wrist and forearm  POSTURE: rounded shoulders, forward head, decreased lumbar lordosis, increased thoracic kyphosis, and R shoulder depressed relative to L  CERVICAL ROM:   Active ROM Eval 01/25/24  Flexion 40 36  Extension 28 27  Right lateral flexion 28 36  Left lateral flexion 21 19  Right rotation 42 50  Left rotation 51 48   (Blank rows = not tested)  UPPER EXTREMITY ROM:   Active ROM Right eval Left eval R 01/25/24 L 01/25/24 R 02/01/24 L 02/01/24 02/15/24  Shoulder flexion 121 142 119 136 121 151 140  Shoulder extension 25 34 35 45 41 41   Shoulder abduction 107 160 105 130 138 ^ 155 111- limited by pain today  Shoulder adduction         Shoulder internal rotation FIR lateral lower ribs FIR T10 L2 L1 L1 T12 L1  Shoulder external rotation FER T2 FER T4 T2 T3 T3 T3 T3  Elbow flexion         Elbow extension         Wrist flexion         Wrist extension         Wrist ulnar deviation  Wrist radial deviation         Wrist pronation         Wrist supination         (Blank rows = not tested, ^ = increased  pain)  UPPER EXTREMITY MMT:  MMT Right eval Left eval R 02/01/24 L 02/01/24  Shoulder flexion 4- 4 4 4   Shoulder extension 4- 4 4+ 4+  Shoulder abduction 4- 4- 4- 4  Shoulder adduction      Shoulder internal rotation 4 4+ 4+ 4+  Shoulder external rotation 4- 4+ 4 4+  Middle trapezius 4- 4 4- 4  Lower trapezius 3- 3+ 3- 3  Elbow flexion 4+ 5 5 5   Elbow extension 4- 5 4 5   Wrist flexion      Wrist extension      Wrist ulnar deviation      Wrist radial deviation      Wrist pronation      Wrist supination      Grip strength (lbs) 42# 45#    (Blank rows = not tested)  SHOULDER SPECIAL TESTS: Impingement tests: Neer impingement test: positive  and Hawkins/Kennedy impingement test: positive  Rotator cuff assessment: Empty can test: positive  and Full can test: negative  JOINT MOBILITY TESTING:  R shoulder restricted in all planes  PALPATION:  Increased muscle tension and TTP in R UT, LS, all RTC muscles, lats, pecs     TODAY'S TREATMENT:  02/15/24 THERAPEUTIC EXERCISE: To improve strength and endurance.  Demonstration, verbal and tactile cues throughout for technique.  UBE - L2.0 x 6 min (3' each fwd & back) UE ROM  Self care home management: HEP review and consolidation Cues needed with lower trap sets and progressed ER with band  02/01/2024 THERAPEUTIC EXERCISE: To improve strength and endurance.  Demonstration, verbal and tactile cues throughout for technique.  UBE - L2.0 x 6 min (3' each fwd & back)  THERAPEUTIC ACTIVITIES: To improve functional performance.  Demonstration, verbal and tactile cues throughout for technique.  Shoulder ROM assessment UE MMT QuickDASH: 65.9 / 100 = 65.9 % Goal assessment  NEUROMUSCULAR RE-EDUCATION: To improve coordination, kinesthesia, posture, and proprioception.  Lower trap setting at wall - "V" wall slide + slight lift-off x 10 - PT providing verbal and tactile cues for scapular engagement/desired movement pattern for lower trap  activation Scap retraction + depression - YTB draped over shoulders with band crossed behind back - hands pressing down and slightly out 2 x 10  MODALITIES:  Moist heat pack to R shoulder x 10 min   01/28/2024  THERAPEUTIC EXERCISE: To improve strength, endurance, and ROM.  Demonstration, verbal and tactile cues throughout for technique.  UBE - L2.0 x 6 min (3' each fwd & back) Seated scap retraction into pool noodle + B shoulder scaption 2 x 10, 2nd set with YTB - fatigues quickly requiring reps 3 x 3 + 1 to complete 10 reps Seated scap retraction into pool noodle + B shoulder ER x 2 x 10, 2nd set with YTB - fatigues quickly requiring reps 2 x 5 to complete 10 reps  NEUROMUSCULAR RE-EDUCATION: To improve coordination, kinesthesia, and posture. Seated R shoulder rhythmic stabilization at ~90 flexion 2 x 15 sec - arm dropping during attempts Standing R shoulder rhythmic stabilization with fist on ball on wall at ~90 flexion and scaption 2 x 15 sec each, quick taps by therapist all planes with goal of maintaining stable position/decreased movement R shoulder Standing R shoulder CW/CCW  circles with fist on ball on wall at 90 flexion and scaption 2 x 10 each   MODALITIES:  Moist heat pack to R shoulder x 10 min   01/25/2024  THERAPEUTIC EXERCISE: To improve strength, endurance, ROM, and flexibility.  Demonstration, verbal and tactile cues throughout for technique.  UBE - L1.5 x 6 min (3' each fwd & back) R posterior capsule stretch 2 x 30" - cues for proper alignment and height of shoulder Lower trap setting at wall - "V" wall slide + slight lift-off x 10  MANUAL THERAPY: To promote normalized muscle tension, improved flexibility, increased ROM, and reduced pain. Trigger Point Dry Needling: Treatment instructions/education: Subsequent Treatment: Instructions provided previously at initial dry needling treatment.  Instructions reviewed, if requested by the patient, prior to subsequent dry  needling treatment.  Education Handout Provided: Previously Provided Consent: Patient Verbal Consent Given: Yes Treatment: Muscles Treated: R infraspinatus, R posterior deltoid, R UT, R subscapularis Skilled palpation and monitoring of soft tissue during DN Electrical Stimulation Performed: No Treatment Response/Outcome: Twitch response elicited, Palpable increase in muscle length, Decreased tissue resistance noted, and Decreased TTP STM/DTM, manual TPR and pin & stretch to muscles addressed with DN   PATIENT EDUCATION:  Education details: progress with PT, ongoing PT POC, and postural awareness  Person educated: Patient Education method: Explanation, Demonstration, and Verbal cues Education comprehension: verbalized understanding, returned demonstration, verbal cues required, and needs further education  HOME EXERCISE PROGRAM: Access Code: [email or web address] URL: [email or web address] Date: 02/01/2024 Prepared by: Glenetta Hew  Exercises - Seated Scapular Retraction with External Rotation  - 2 x daily - 7 x weekly - 2 sets - 10 reps - 3 sec hold - Seated Thoracic Lumbar Extension  - 2 x daily - 7 x weekly - 2 sets - 10 reps - 3 sec hold - Open Book Chest Stretch on Towel Roll  - 1 x daily - 7 x weekly - 2 sets - 10 reps - 3 sec hold - Supine Shoulder Horizontal Abduction with Resistance  - 1 x daily - 3-4 x weekly - 2 sets - 10 reps - 3 sec hold - Standing Shoulder Posterior Capsule Stretch  - 1-2 x daily - 7 x weekly - 3 reps - 30 sec hold - Low Trap Setting at Wall  - 1 x daily - 7 x weekly - 2 sets - 10 reps - 3 sec hold - Prone Single Arm Shoulder Horizontal Abduction with Scapular Retraction and Palm Down  - 1 x daily - 7 x weekly - 2 sets - 10 reps - 3 sec hold - Prone Shoulder Extension - Single Arm  - 1 x daily - 7 x weekly - 2 sets - 10 reps - 3 sec hold - Prone Single Arm Shoulder Y  - 1 x daily - 7 x weekly - 2 sets - 10 reps - 3 sec hold - Staggered Stance Scapular  Retraction and Depression with Resistance  - 1 x daily - 7 x weekly - 2 sets - 10 reps - 3 sec hold  Patient Education - Trigger Point Dry Needling  Access Code from prior PT episode: [email or web address]    ASSESSMENT:  CLINICAL IMPRESSION: Reviewed her HEP to ensure understanding. Clarification needed for some of the exercises. Shoulder ROM is improving well but she was more limited with abduction today s/t pain. Jakaylah continues to have potential to benefit from continued skilled PT to address ongoing deficits and functional impairments to improve mobility and activity tolerance.  EVAL: ELECTRA PALADINO is a 58 y.o. female who was referred to physical therapy for evaluation and treatment for chronic R shoulder pain.  Recent EMG/NCV showing evidence of cervical radiculopathy and R carpal tunnel syndrome for which she is pending follow-up with her neurosurgeon, Dr. Yevette Edwards over at [facility], in relation to the cervical radiculopathy and will be scheduled for carpal tunnel release with Dr. Ranell Patrick.  Patient reports ongoing R pain since R RCR in 12/2022. Pain is worse with lying on R side and lifting, and patient notes progressive weakness as well as increased pain with any increased in activity.  Patient has deficits in cervicsl and R shoulder ROM, cervical and postural flexibility, R>L shoulder/UE strength, abnormal posture, and TTP with abnormal muscle tension t/o R shoulder complex which are interfering with ADLs and are impacting quality of life.  On QuickDASH patient scored 52.3/100 demonstrating 52.3% disability.  Paislea will benefit from skilled PT to address above deficits to improve mobility and activity tolerance with decreased pain interference.  OBJECTIVE IMPAIRMENTS: decreased activity tolerance, decreased endurance, decreased knowledge of condition, decreased mobility, decreased ROM, decreased strength, hypomobility, increased fascial restrictions, impaired perceived functional ability,  increased muscle spasms, impaired flexibility, impaired sensation, impaired UE functional use, improper body mechanics, postural dysfunction, and pain.   ACTIVITY LIMITATIONS: carrying, lifting, sleeping, bed mobility, bathing, dressing, reach over head, hygiene/grooming, and caring for others  PARTICIPATION LIMITATIONS: meal prep, cleaning, laundry, shopping, and community activity  PERSONAL FACTORS: Behavior pattern, Fitness, Past/current experiences, Time since onset of injury/illness/exacerbation, and 3+ comorbidities: R shoulder RCR 12/14/22, C5-6 ACDF 2015, Cervical radiculopathy, R CTS, L shoulder surgery - SAD/DCR 08/06/15, Chronic back pain, Lumbar fusion, Anxiety, Depression, OSA  are also affecting patient's functional outcome.   REHAB POTENTIAL: Good  CLINICAL DECISION MAKING: Unstable/unpredictable  EVALUATION COMPLEXITY: High   GOALS: Goals reviewed with patient? Yes  SHORT TERM GOALS: Target date: 01/21/2024  Patient will be independent with initial HEP to improve outcomes and carryover.  Baseline:  Goal status: MET - 01/25/24    2.  Patient will report 25% improvement in R shoulder pain to improve QOL.   Baseline: 8-9/10 Goal status: IN PROGRESS - 02/01/24 - pain essentially unchanged (some days better, some days worse), but shoulder feels looser  LONG TERM GOALS: Target date: 02/25/2024  Patient will be independent with ongoing/advanced HEP for self-management at home.  Baseline:  Goal status: IN PROGRESS - 02/01/24 - Pt reports daily performance of HEP but does experience increased pain at time and notes fatigue requiring small sets with frequent rest breaks  2.  Patient will report 50-75% improvement in R shoulder pain to improve QOL.  Baseline: 8-9/10 Goal status: IN PROGRESS - 02/01/24 - pain essentially unchanged (some days better, some days worse), but shoulder feels looser  3.  Patient to demonstrate improved upright posture with posterior shoulder girdle engaged  to promote improved glenohumeral joint mobility. Baseline:  Goal status: IN PROGRESS - 02/01/24 - Pt persists with fwd head and rounded shoulder posture with increased postural kyphosis  4.  Patient to improve R shoulder AROM to [facility] without pain provocation to allow for increased ease of ADLs.  Baseline: Refer to above UE ROM table Goal status: IN PROGRESS - 02/15/24 R shoulder flexion increased  5.  Patient will demonstrate improved B shoulder and scapular strength to >/= 4 to 4+/5 for functional UE use. Baseline: Refer to above UE MMT table Goal status: IN PROGRESS - 02/01/24 - slight gains in some  motions in B shoulders but still significant weakness, esp in scapular muscles  6  Patient will report </= 42% on QuickDASH to demonstrate improved functional ability.  Baseline: 52.3 / 100 = 52.3 % Goal status: IN PROGRESS - 02/01/24 - 65.9 / 100 = 65.9 % - worsening function potentially related to cervical radiculopathy and carpal tunnel issues   PLAN:  PT FREQUENCY: 2x/week  PT DURATION: 4-8 weeks  PLANNED INTERVENTIONS: 97164- PT Re-evaluation, 97110-Therapeutic exercises, 97530- Therapeutic activity, 97112- Neuromuscular re-education, 97535- Self Care, 16109- Manual therapy, [id number]- Electrical stimulation (unattended), [id number]- Electrical stimulation (manual), 97016- Vasopneumatic device, [id number]- Ultrasound, 09811- Ionotophoresis 4mg /ml Dexamethasone, Patient/Family education, Taping, Dry Needling, Joint mobilization, Spinal mobilization, Cryotherapy, and Moist heat  PLAN FOR NEXT SESSION:  MT including TPDN as indicated to address abnormal muscle tension in R shoulder complex; progress postural awareness/strengthening; gently R shoulder A/AAROM focusing on proper scapulohumeral kinematics; initiate gentle RTC strengthening; modalities PRN for pain management, continue with stabilization R shoulder   Alioune Hodgkin L Sabastien Tyler, PTA 02/15/2024, 11:01 AM

## 2024-02-18 ENCOUNTER — Encounter: Payer: Self-pay | Admitting: Physical Therapy

## 2024-02-18 ENCOUNTER — Ambulatory Visit: Admitting: Physical Therapy

## 2024-02-18 DIAGNOSIS — M25511 Pain in right shoulder: Secondary | ICD-10-CM | POA: Diagnosis not present

## 2024-02-18 DIAGNOSIS — M25611 Stiffness of right shoulder, not elsewhere classified: Secondary | ICD-10-CM

## 2024-02-18 DIAGNOSIS — G8929 Other chronic pain: Secondary | ICD-10-CM

## 2024-02-18 DIAGNOSIS — R293 Abnormal posture: Secondary | ICD-10-CM | POA: Diagnosis not present

## 2024-02-18 DIAGNOSIS — M62838 Other muscle spasm: Secondary | ICD-10-CM

## 2024-02-18 DIAGNOSIS — M6281 Muscle weakness (generalized): Secondary | ICD-10-CM | POA: Diagnosis not present

## 2024-02-18 NOTE — Therapy (Signed)
 OUTPATIENT PHYSICAL THERAPY TREATMENT     Patient Name: Robin Schmidt MRN: [id number] DOB:04-16-1966, 58 y.o., female Today's Date: 02/18/2024   END OF SESSION:  PT End of Session - 02/18/24 1030     Visit Number 10    Date for PT Re-Evaluation 02/25/24    Authorization Type [employer] Dual Complete    PT Start Time 1030   Pt arrived late   PT Stop Time 1108    PT Time Calculation (min) 38 min    Activity Tolerance Patient tolerated treatment well;Patient limited by pain    Behavior During Therapy [facility] for tasks assessed/performed                    Past Medical History:  Diagnosis Date   Anxiety    Back pain, chronic    Depression    Past Surgical History:  Procedure Laterality Date   ANTERIOR CERVICAL DECOMP/DISCECTOMY FUSION N/A 11/22/2013   Procedure: ANTERIOR CERVICAL DECOMPRESSION/DISCECTOMY FUSION 1 LEVEL;  Surgeon: Emilee Hero, MD;  Location: [facility] OR;  Service: Orthopedics;  Laterality: N/A;  Anterior cervical decompression fusion, cervical 5-6 with instrumentation and allograft   back fusion     lumbar   BACK SURGERY     KNEE ARTHROSCOPY     LAPAROSCOPIC GASTRIC SLEEVE RESECTION     2015   ROTATOR CUFF REPAIR Right 12/2022   TUBAL LIGATION     Patient Active Problem List   Diagnosis Date Noted   Obesity (BMI 30-39.9) 11/17/2023   Full thickness rotator cuff tear 11/19/2022   Tinnitus, right 07/08/2017   Carpal tunnel syndrome 07/17/2014   Obstructive sleep apnea 07/17/2014   Radiculopathy 11/22/2013   Menometrorrhagia 06/08/2012    Class: Chronic   Chronic lower back pain 04/01/2007    PCP: Alfredia Ferguson, PA-C   REFERRING PROVIDER: Beverely Low, MD   REFERRING DIAG: M25.511 (ICD-10-CM) -  Pain in right shoulder  THERAPY DIAG:  Chronic right shoulder pain  Abnormal posture  Stiffness of right shoulder, not elsewhere classified  Muscle weakness (generalized)  Other muscle spasm  RATIONALE FOR EVALUATION AND TREATMENT:  Rehabilitation  ONSET DATE: Chronic  NEXT MD VISIT: 03/01/24   SUBJECTIVE:                                                                                                                                                                                                         SUBJECTIVE STATEMENT: Pt reports she saw the MD for the f/u from her MRI - everything looks normal with no obvious  cause from prior neck surgery for the pinched nerve - MD thinks pinched nerve must be coming from her shoulder surgery.  She will f/u with Dr. Ranell Patrick following her shoulder MRI on 02/22/24.  EVAL:  Pt reports ongoing pain upper lateral shoulder since her R shoulder surgery in 12/2022 which causes her shoulder to lock up. Also notes recently diagnosed with B CTS and pinched nerves in her neck. When she sleeps on her R side her hand goes numb.  She notes her arm gets weak when her shoulder "locks up" while trying to do her exercises.  PAIN: Are you having pain? Yes: NPRS scale: 8/10  Pain location: R upper lateral shoulder & down lateral upper arm  Pain description: ongoing sharp dull ache  Aggravating factors: increased activity  Relieving factors: muscle relaxant, heat > cold   PERTINENT HISTORY:  R shoulder RCR 12/14/22, C5-6 ACDF 2015, Cervical radiculopathy, R CTS, L shoulder surgery - SAD/DCR 08/06/15, Chronic back pain, Lumbar fusion, Anxiety, Depression, OSA  PRECAUTIONS: None  RED FLAGS: None  HAND DOMINANCE: Right  WEIGHT BEARING RESTRICTIONS: No  FALLS:  Has patient fallen in last 6 months? No  LIVING ENVIRONMENT: Lives with: lives with their family Lives in: House/apartment Stairs: Yes: Internal: 14 steps; on right going up and External: 1 steps; none Has following equipment at home: Single point cane  OCCUPATION: On disability  PLOF: Independent with household mobility without device, Independent with gait, Independent with transfers, Needs assistance with ADLs, Needs assistance with  homemaking, and Leisure: mostly sedentary due to depression and cold weather   PATIENT GOALS: "To be more flexible and pain free if possible."   OBJECTIVE: (objective measures completed at initial evaluation unless otherwise dated)  DIAGNOSTIC FINDINGS:  12/22/2023 - EMG nerve conduction study reviewed with the patient showing evidence of a cervical radiculopathy as well as right hand carpal tunnel syndrome, mild with no motor nerve involvement. She has no evidence of left carpal tunnel syndrome on nerve conduction study. The cervical radiculopathy is concerning. Patient has seen Dr. Yevette Edwards over at [facility] ortho and is planning on seeing him to review options for the neck given the ongoing cervical radiculopathy noted on the EMG/NCS here. Recommend right hand CTR here in office using WALANT technique. Patient agrees, referral made.   PATIENT SURVEYS:  Quick Dash 52.3 / 100 = 52.3 %; limitations also potentially impacted by cervical radiculopathy and B CTS 02/01/24: QuickDASH 65.9 / 100 = 65.9 %  COGNITION: Overall cognitive status: Within functional limits for tasks assessed     SENSATION: Intermittent numbness and tingling in radial side of hand, wrist and forearm  POSTURE: rounded shoulders, forward head, decreased lumbar lordosis, increased thoracic kyphosis, and R shoulder depressed relative to L  CERVICAL ROM:   Active ROM Eval 01/25/24  Flexion 40 36  Extension 28 27  Right lateral flexion 28 36  Left lateral flexion 21 19  Right rotation 42 50  Left rotation 51 48   (Blank rows = not tested)  UPPER EXTREMITY ROM:   Active ROM Right eval Left eval R 01/25/24 L 01/25/24 R 02/01/24 L 02/01/24 02/15/24  Shoulder flexion 121 142 119 136 121 151 140  Shoulder extension 25 34 35 45 41 41   Shoulder abduction 107 160 105 130 138 ^ 155 111- limited by pain today  Shoulder adduction         Shoulder internal rotation FIR lateral lower ribs FIR T10 L2 L1 L1 T12 L1  Shoulder external  rotation FER T2 FER T4 T2 T3 T3 T3 T3  Elbow flexion         Elbow extension         Wrist flexion         Wrist extension         Wrist ulnar deviation         Wrist radial deviation         Wrist pronation         Wrist supination         (Blank rows = not tested, ^ = increased pain)  UPPER EXTREMITY MMT:  MMT Right eval Left eval R 02/01/24 L 02/01/24  Shoulder flexion 4- 4 4 4   Shoulder extension 4- 4 4+ 4+  Shoulder abduction 4- 4- 4- 4  Shoulder adduction      Shoulder internal rotation 4 4+ 4+ 4+  Shoulder external rotation 4- 4+ 4 4+  Middle trapezius 4- 4 4- 4  Lower trapezius 3- 3+ 3- 3  Elbow flexion 4+ 5 5 5   Elbow extension 4- 5 4 5   Wrist flexion      Wrist extension      Wrist ulnar deviation      Wrist radial deviation      Wrist pronation      Wrist supination      Grip strength (lbs) 42# 45#    (Blank rows = not tested)  SHOULDER SPECIAL TESTS: Impingement tests: Neer impingement test: positive  and Hawkins/Kennedy impingement test: positive  Rotator cuff assessment: Empty can test: positive  and Full can test: negative  JOINT MOBILITY TESTING:  R shoulder restricted in all planes  PALPATION:  Increased muscle tension and TTP in R UT, LS, all RTC muscles, lats, pecs     TODAY'S TREATMENT:   02/18/2024  THERAPEUTIC EXERCISE: To improve strength, endurance, and ROM.  Demonstration, verbal and tactile cues throughout for technique.  UBE - L2.0 x 4 min (2' each fwd & back) - time shortened due to late arrival Seated scap retraction + B shoulder ER - clarification of 90 elbow flexion and neutral or supinated forearms Demonstrated using FR in place of towel roll for supine open book stretch but not attempted  Lower trap setting at wall  MANUAL THERAPY: To promote normalized muscle tension, improved flexibility, improved joint mobility, increased ROM, and reduced pain utilizing connective tissue massage, therapeutic massage, and manual TP  therapy. STM/DTM and manual TPR to R LS and medial scapular muscles  SELF CARE: Provided education to prevent loss of gains achieved with physical therapy and to prevent future decline in function.  HEP review and update/consolidation including instruction in recommended frequency for ongoing home performance Provided instruction in self-STM techniques to R posterior shoulder using Theracane - pt performed return demostration.    02/15/24 THERAPEUTIC EXERCISE: To improve strength and endurance.  Demonstration, verbal and tactile cues throughout for technique.  UBE - L2.0 x 6 min (3' each fwd & back) UE ROM  Self care home management: HEP review and consolidation Cues needed with lower trap sets and progressed ER with band   02/01/2024 THERAPEUTIC EXERCISE: To improve strength and endurance.  Demonstration, verbal and tactile cues throughout for technique.  UBE - L2.0 x 6 min (3' each fwd & back)  THERAPEUTIC ACTIVITIES: To improve functional performance.  Demonstration, verbal and tactile cues throughout for technique.  Shoulder ROM assessment UE MMT QuickDASH: 65.9 / 100 = 65.9 % Goal assessment  NEUROMUSCULAR RE-EDUCATION:  To improve coordination, kinesthesia, posture, and proprioception.  Lower trap setting at wall - "V" wall slide + slight lift-off x 10 - PT providing verbal and tactile cues for scapular engagement/desired movement pattern for lower trap activation Scap retraction + depression - YTB draped over shoulders with band crossed behind back - hands pressing down and slightly out 2 x 10  MODALITIES:  Moist heat pack to R shoulder x 10 min   PATIENT EDUCATION:  Education details: HEP review, HEP consolidation, recommended frequency for ongoing HEP at discharge to prevent loss of gains achieved with PT, and postural awareness  Person educated: Patient Education method: Explanation, Demonstration, Verbal cues, Handouts, and MedBridgeGO access provided Education  comprehension: verbalized understanding, returned demonstration, verbal cues required, and needs further education  HOME EXERCISE PROGRAM: *Patient has MedBridge GO app  Access Code: [email or web address] URL: [email or web address] Date: 02/18/2024 Prepared by: Glenetta Hew  Exercises - Standing Shoulder Posterior Capsule Stretch  - 1-2 x daily - 7 x weekly - 3 reps - 30 sec hold - Seated Thoracic Lumbar Extension with Pectoralis Stretch  - 1 x daily - 7 x weekly - 2 sets - 10 reps - 3 sec hold - Seated Scapular Retraction with External Rotation  - 1 x daily - 7 x weekly - 2 sets - 10 reps - 3 sec hold - Open Book Chest Stretch on Towel Roll  - 1 x daily - 7 x weekly - 2 sets - 10 reps - 3 sec hold - Supine Shoulder Horizontal Abduction with Resistance  - 1 x daily - 3 x weekly - 2 sets - 10 reps - 3 sec hold - Low Trap Setting at Wall  - 1 x daily - 3 x weekly - 2 sets - 10 reps - 3 sec hold - Prone Single Arm Shoulder Horizontal Abduction with Scapular Retraction and Palm Down  - 1 x daily - 3 x weekly - 2 sets - 10 reps - 3 sec hold - Prone Shoulder Extension - Single Arm  - 1 x daily - 3 x weekly - 2 sets - 10 reps - 3 sec hold - Prone Single Arm Shoulder Y  - 1 x daily - 3 x weekly - 2 sets - 10 reps - 3 sec hold - Staggered Stance Scapular Retraction and Depression with Resistance  - 1 x daily - 3 x weekly - 2 sets - 10 reps - 3 sec hold - Theracane Over Shoulder  - 1-2 x daily - 7 x weekly - 1-2 min hold  Patient Education - Trigger Point Dry Needling  Access Code from prior PT episode: [email or web address]    ASSESSMENT:  CLINICAL IMPRESSION: Followed up on HEP review and update from last visit with patient reporting that she did not recognize some of the pictures as we review them today as well as further clarifications necessary for proper alignment and technique with a few exercises.  She continues to report tightness/tension in R upper and posterior shoulder with increased muscle  tension identified in R LS and rhomboids which was addressed with manual STM/DTM and TPR.  Education provided for options for self STM including use of TheraCane.  Treatment session limited due to patient's late arrival.  Will plan for final goal assessment and anticipated transition to HEP as of next visit.   EVAL: Robin Schmidt is a 58 y.o. female who was referred to physical therapy for evaluation and treatment for chronic R shoulder pain.  Recent EMG/NCV showing evidence  of cervical radiculopathy and R carpal tunnel syndrome for which she is pending follow-up with her neurosurgeon, Dr. Yevette Edwards over at [facility], in relation to the cervical radiculopathy and will be scheduled for carpal tunnel release with Dr. Ranell Patrick.  Patient reports ongoing R pain since R RCR in 12/2022. Pain is worse with lying on R side and lifting, and patient notes progressive weakness as well as increased pain with any increased in activity.  Patient has deficits in cervicsl and R shoulder ROM, cervical and postural flexibility, R>L shoulder/UE strength, abnormal posture, and TTP with abnormal muscle tension t/o R shoulder complex which are interfering with ADLs and are impacting quality of life.  On QuickDASH patient scored 52.3/100 demonstrating 52.3% disability.  Robin Schmidt will benefit from skilled PT to address above deficits to improve mobility and activity tolerance with decreased pain interference.  OBJECTIVE IMPAIRMENTS: decreased activity tolerance, decreased endurance, decreased knowledge of condition, decreased mobility, decreased ROM, decreased strength, hypomobility, increased fascial restrictions, impaired perceived functional ability, increased muscle spasms, impaired flexibility, impaired sensation, impaired UE functional use, improper body mechanics, postural dysfunction, and pain.   ACTIVITY LIMITATIONS: carrying, lifting, sleeping, bed mobility, bathing, dressing, reach over head, hygiene/grooming, and caring  for others  PARTICIPATION LIMITATIONS: meal prep, cleaning, laundry, shopping, and community activity  PERSONAL FACTORS: Behavior pattern, Fitness, Past/current experiences, Time since onset of injury/illness/exacerbation, and 3+ comorbidities: R shoulder RCR 12/14/22, C5-6 ACDF 2015, Cervical radiculopathy, R CTS, L shoulder surgery - SAD/DCR 08/06/15, Chronic back pain, Lumbar fusion, Anxiety, Depression, OSA  are also affecting patient's functional outcome.   REHAB POTENTIAL: Good  CLINICAL DECISION MAKING: Unstable/unpredictable  EVALUATION COMPLEXITY: High   GOALS: Goals reviewed with patient? Yes  SHORT TERM GOALS: Target date: 01/21/2024  Patient will be independent with initial HEP to improve outcomes and carryover.  Baseline:  Goal status: MET - 01/25/24    2.  Patient will report 25% improvement in R shoulder pain to improve QOL.   Baseline: 8-9/10 Goal status: IN PROGRESS - 02/01/24 - pain essentially unchanged (some days better, some days worse), but shoulder feels looser  LONG TERM GOALS: Target date: 02/25/2024  Patient will be independent with ongoing/advanced HEP for self-management at home.  Baseline:  Goal status: IN PROGRESS - 02/01/24 - Pt reports daily performance of HEP but does experience increased pain at time and notes fatigue requiring small sets with frequent rest breaks  2.  Patient will report 50-75% improvement in R shoulder pain to improve QOL.  Baseline: 8-9/10 Goal status: IN PROGRESS - 02/01/24 - pain essentially unchanged (some days better, some days worse), but shoulder feels looser  3.  Patient to demonstrate improved upright posture with posterior shoulder girdle engaged to promote improved glenohumeral joint mobility. Baseline:  Goal status: IN PROGRESS - 02/01/24 - Pt persists with fwd head and rounded shoulder posture with increased postural kyphosis  4.  Patient to improve R shoulder AROM to [facility] without pain provocation to allow for increased  ease of ADLs.  Baseline: Refer to above UE ROM table Goal status: IN PROGRESS - 02/15/24 R shoulder flexion increased  5.  Patient will demonstrate improved B shoulder and scapular strength to >/= 4 to 4+/5 for functional UE use. Baseline: Refer to above UE MMT table Goal status: IN PROGRESS - 02/01/24 - slight gains in some motions in B shoulders but still significant weakness, esp in scapular muscles  6  Patient will report </= 42% on QuickDASH to demonstrate improved functional ability.  Baseline:  52.3 / 100 = 52.3 % Goal status: IN PROGRESS - 02/01/24 - 65.9 / 100 = 65.9 % - worsening function potentially related to cervical radiculopathy and carpal tunnel issues   PLAN:  PT FREQUENCY: 2x/week  PT DURATION: 4-8 weeks  PLANNED INTERVENTIONS: [id number]- PT Re-evaluation, 97110-Therapeutic exercises, 97530- Therapeutic activity, 97112- Neuromuscular re-education, 97535- Self Care, 21308- Manual therapy, [id number]- Electrical stimulation (unattended), [id number]- Electrical stimulation (manual), 97016- Vasopneumatic device, [id number]- Ultrasound, 65784- Ionotophoresis 4mg /ml Dexamethasone, Patient/Family education, Taping, Dry Needling, Joint mobilization, Spinal mobilization, Cryotherapy, and Moist heat  PLAN FOR NEXT SESSION: Goal assessment; anticipate transition to HEP + discharge from PT   Marry Guan, PT 02/18/2024, 12:26 PM

## 2024-02-22 ENCOUNTER — Ambulatory Visit: Admitting: Physical Therapy

## 2024-02-22 ENCOUNTER — Encounter: Payer: Self-pay | Admitting: Physical Therapy

## 2024-02-22 DIAGNOSIS — M62838 Other muscle spasm: Secondary | ICD-10-CM

## 2024-02-22 DIAGNOSIS — K219 Gastro-esophageal reflux disease without esophagitis: Secondary | ICD-10-CM | POA: Diagnosis not present

## 2024-02-22 DIAGNOSIS — M25511 Pain in right shoulder: Secondary | ICD-10-CM | POA: Diagnosis not present

## 2024-02-22 DIAGNOSIS — G8929 Other chronic pain: Secondary | ICD-10-CM | POA: Diagnosis not present

## 2024-02-22 DIAGNOSIS — Z809 Family history of malignant neoplasm, unspecified: Secondary | ICD-10-CM | POA: Diagnosis not present

## 2024-02-22 DIAGNOSIS — R030 Elevated blood-pressure reading, without diagnosis of hypertension: Secondary | ICD-10-CM | POA: Diagnosis not present

## 2024-02-22 DIAGNOSIS — M1990 Unspecified osteoarthritis, unspecified site: Secondary | ICD-10-CM | POA: Diagnosis not present

## 2024-02-22 DIAGNOSIS — Z833 Family history of diabetes mellitus: Secondary | ICD-10-CM | POA: Diagnosis not present

## 2024-02-22 DIAGNOSIS — Z8249 Family history of ischemic heart disease and other diseases of the circulatory system: Secondary | ICD-10-CM | POA: Diagnosis not present

## 2024-02-22 DIAGNOSIS — Z9181 History of falling: Secondary | ICD-10-CM | POA: Diagnosis not present

## 2024-02-22 DIAGNOSIS — R293 Abnormal posture: Secondary | ICD-10-CM

## 2024-02-22 DIAGNOSIS — M25611 Stiffness of right shoulder, not elsewhere classified: Secondary | ICD-10-CM | POA: Diagnosis not present

## 2024-02-22 DIAGNOSIS — M6281 Muscle weakness (generalized): Secondary | ICD-10-CM | POA: Diagnosis not present

## 2024-02-22 DIAGNOSIS — J309 Allergic rhinitis, unspecified: Secondary | ICD-10-CM | POA: Diagnosis not present

## 2024-02-22 DIAGNOSIS — Z6830 Body mass index (BMI) 30.0-30.9, adult: Secondary | ICD-10-CM | POA: Diagnosis not present

## 2024-02-22 DIAGNOSIS — F329 Major depressive disorder, single episode, unspecified: Secondary | ICD-10-CM | POA: Diagnosis not present

## 2024-02-22 DIAGNOSIS — G629 Polyneuropathy, unspecified: Secondary | ICD-10-CM | POA: Diagnosis not present

## 2024-02-22 NOTE — Therapy (Signed)
 OUTPATIENT PHYSICAL THERAPY TREATMENT     Patient Name: Robin Schmidt MRN: [id number] DOB:03/11/1966, 58 y.o., female Today's Date: 02/22/2024   END OF SESSION:  PT End of Session - 02/22/24 1103     Visit Number 11    Date for PT Re-Evaluation 02/25/24    Authorization Type [employer] Dual Complete    PT Start Time 1103    PT Stop Time 1141    PT Time Calculation (min) 38 min    Activity Tolerance Patient tolerated treatment well;Patient limited by pain    Behavior During Therapy [facility] for tasks assessed/performed                     Past Medical History:  Diagnosis Date   Anxiety    Back pain, chronic    Depression    Past Surgical History:  Procedure Laterality Date   ANTERIOR CERVICAL DECOMP/DISCECTOMY FUSION N/A 11/22/2013   Procedure: ANTERIOR CERVICAL DECOMPRESSION/DISCECTOMY FUSION 1 LEVEL;  Surgeon: Estevan Helper, MD;  Location: [facility] OR;  Service: Orthopedics;  Laterality: N/A;  Anterior cervical decompression fusion, cervical 5-6 with instrumentation and allograft   back fusion     lumbar   BACK SURGERY     KNEE ARTHROSCOPY     LAPAROSCOPIC GASTRIC SLEEVE RESECTION     2015   ROTATOR CUFF REPAIR Right 12/2022   TUBAL LIGATION     Patient Active Problem List   Diagnosis Date Noted   Obesity (BMI 30-39.9) 11/17/2023   Full thickness rotator cuff tear 11/19/2022   Tinnitus, right 07/08/2017   Carpal tunnel syndrome 07/17/2014   Obstructive sleep apnea 07/17/2014   Radiculopathy 11/22/2013   Menometrorrhagia 06/08/2012    Class: Chronic   Chronic lower back pain 04/01/2007    PCP: Trenton Frock, PA-C   REFERRING PROVIDER: Winston Hawking, MD   REFERRING DIAG: M25.511 (ICD-10-CM) -  Pain in right shoulder  THERAPY DIAG:  Chronic right shoulder pain  Abnormal posture  Stiffness of right shoulder, not elsewhere classified  Muscle weakness (generalized)  Other muscle spasm  RATIONALE FOR EVALUATION AND TREATMENT:  Rehabilitation  ONSET DATE: Chronic  NEXT MD VISIT: 03/01/24   SUBJECTIVE:                                                                                                                                                                                                         SUBJECTIVE STATEMENT: Pt continues to report high pain levels w/o significant change since start of PT.  R shoulder MRI pending, after which she  will f/u with Dr. Ranell Patrick.  PAIN: Are you having pain? Yes: NPRS scale: 8/10  Pain location: R upper lateral shoulder & down lateral upper arm  Pain description: ongoing sharp dull ache  Aggravating factors: increased activity  Relieving factors: muscle relaxant, heat > cold   PERTINENT HISTORY:  R shoulder RCR 12/14/22, C5-6 ACDF 2015, Cervical radiculopathy, R CTS, L shoulder surgery - SAD/DCR 08/06/15, Chronic back pain, Lumbar fusion, Anxiety, Depression, OSA  PRECAUTIONS: None  RED FLAGS: None  HAND DOMINANCE: Right  WEIGHT BEARING RESTRICTIONS: No  FALLS:  Has patient fallen in last 6 months? No  LIVING ENVIRONMENT: Lives with: lives with their family Lives in: House/apartment Stairs: Yes: Internal: 14 steps; on right going up and External: 1 steps; none Has following equipment at home: Single point cane  OCCUPATION: On disability  PLOF: Independent with household mobility without device, Independent with gait, Independent with transfers, Needs assistance with ADLs, Needs assistance with homemaking, and Leisure: mostly sedentary due to depression and cold weather   PATIENT GOALS: "To be more flexible and pain free if possible."   OBJECTIVE: (objective measures completed at initial evaluation unless otherwise dated)  DIAGNOSTIC FINDINGS:  12/22/2023 - EMG nerve conduction study reviewed with the patient showing evidence of a cervical radiculopathy as well as right hand carpal tunnel syndrome, mild with no motor nerve involvement. She has no evidence of  left carpal tunnel syndrome on nerve conduction study. The cervical radiculopathy is concerning. Patient has seen Dr. Yevette Edwards over at [facility]o and is planning on seeing him to review options for the neck given the ongoing cervical radiculopathy noted on the EMG/NCS here. Recommend right hand CTR here in office using WALANT technique. Patient agrees, referral made.   PATIENT SURVEYS:  Quick Dash 52.3 / 100 = 52.3 %; limitations also potentially impacted by cervical radiculopathy and B CTS 02/01/24: QuickDASH 65.9 / 100 = 65.9 %  COGNITION: Overall cognitive status: Within functional limits for tasks assessed     SENSATION: Intermittent numbness and tingling in radial side of hand, wrist and forearm  POSTURE: rounded shoulders, forward head, decreased lumbar lordosis, increased thoracic kyphosis, and R shoulder depressed relative to L  CERVICAL ROM:   Active ROM Eval 01/25/24 02/22/24  Flexion 40 36 27 p!  Extension 28 27 28    Right lateral flexion 28 36 41  Left lateral flexion 21 19 26   Right rotation 42 50 48  Left rotation 51 48 42   (Blank rows = not tested)  UPPER EXTREMITY ROM:   Active ROM Right eval Left eval R 01/25/24 L 01/25/24 R 02/01/24 L 02/01/24 02/15/24 R 02/22/24 L 02/22/24  Shoulder flexion 121 142 119 136 121 151 140 110 138  Shoulder extension 25 34 35 45 41 41     Shoulder abduction 107 160 105 130 138 ^ 155 111- limited by pain today 104 145  Shoulder adduction           Shoulder internal rotation FIR lateral lower ribs FIR T10 L2 L1 L1 T12 L1 L1 T12  Shoulder external rotation FER T2 FER T4 T2 T3 T3 T3 T3 T2 T2  Elbow flexion           Elbow extension           Wrist flexion           Wrist extension           Wrist ulnar deviation  Wrist radial deviation           Wrist pronation           Wrist supination           (Blank rows = not tested, ^ = increased pain)  UPPER EXTREMITY MMT:  MMT Right eval Left eval R 02/01/24 L 02/01/24 R 02/22/24 L  02/22/24  Shoulder flexion 4- 4 4 4 4 4   Shoulder extension 4- 4 4+ 4+ 4+ 4+  Shoulder abduction 4- 4- 4- 4 4 4   Shoulder adduction        Shoulder internal rotation 4 4+ 4+ 4+ 4+ 4+  Shoulder external rotation 4- 4+ 4 4+ 4 4+  Middle trapezius 4- 4 4- 4 4- 4  Lower trapezius 3- 3+ 3- 3 2 2   Elbow flexion 4+ 5 5 5 5 5   Elbow extension 4- 5 4 5 4 5   Wrist flexion        Wrist extension        Wrist ulnar deviation        Wrist radial deviation        Wrist pronation        Wrist supination        Grip strength (lbs) 42# 45#   33.3# 40#  (Blank rows = not tested)  SHOULDER SPECIAL TESTS: Impingement tests: Neer impingement test: positive  and Hawkins/Kennedy impingement test: positive  Rotator cuff assessment: Empty can test: positive  and Full can test: negative  JOINT MOBILITY TESTING:  R shoulder restricted in all planes  PALPATION:  Increased muscle tension and TTP in R UT, LS, all RTC muscles, lats, pecs     TODAY'S TREATMENT:   02/22/2024  THERAPEUTIC EXERCISE: To improve strength and endurance.  Demonstration, verbal and tactile cues throughout for technique.  UBE - L2.0 x 6 min (3' each fwd & back) Reviewed recommended frequency for ongoing HEP performance  THERAPEUTIC ACTIVITIES: To improve functional performance.  Demonstration, verbal and tactile cues throughout for technique. Cervical and shoulder ROM assessment UE MMT QuickDASH = 63.6 / 100 = 63.6 % Goal assessment   02/18/2024  THERAPEUTIC EXERCISE: To improve strength, endurance, and ROM.  Demonstration, verbal and tactile cues throughout for technique.  UBE - L2.0 x 4 min (2' each fwd & back) - time shortened due to late arrival Seated scap retraction + B shoulder ER - clarification of 90 elbow flexion and neutral or supinated forearms Demonstrated using FR in place of towel roll for supine open book stretch but not attempted  Lower trap setting at wall  MANUAL THERAPY: To promote normalized muscle  tension, improved flexibility, improved joint mobility, increased ROM, and reduced pain utilizing connective tissue massage, therapeutic massage, and manual TP therapy. STM/DTM and manual TPR to R LS and medial scapular muscles  SELF CARE: Provided education to prevent loss of gains achieved with physical therapy and to prevent future decline in function.  HEP review and update/consolidation including instruction in recommended frequency for ongoing home performance Provided instruction in self-STM techniques to R posterior shoulder using Theracane - pt performed return demostration.    02/15/24 THERAPEUTIC EXERCISE: To improve strength and endurance.  Demonstration, verbal and tactile cues throughout for technique.  UBE - L2.0 x 6 min (3' each fwd & back) UE ROM  Self care home management: HEP review and consolidation Cues needed with lower trap sets and progressed ER with band   02/01/2024 THERAPEUTIC EXERCISE: To improve strength and endurance.  Demonstration, verbal and tactile cues throughout for technique.  UBE - L2.0 x 6 min (3' each fwd & back)  THERAPEUTIC ACTIVITIES: To improve functional performance.  Demonstration, verbal and tactile cues throughout for technique.  Shoulder ROM assessment UE MMT QuickDASH: 65.9 / 100 = 65.9 % Goal assessment  NEUROMUSCULAR RE-EDUCATION: To improve coordination, kinesthesia, posture, and proprioception.  Lower trap setting at wall - "V" wall slide + slight lift-off x 10 - PT providing verbal and tactile cues for scapular engagement/desired movement pattern for lower trap activation Scap retraction + depression - YTB draped over shoulders with band crossed behind back - hands pressing down and slightly out 2 x 10  MODALITIES:  Moist heat pack to R shoulder x 10 min   PATIENT EDUCATION:  Education details: recommended frequency for ongoing HEP at discharge to prevent loss of gains achieved with PT and postural awareness  Person educated:  Patient Education method: Explanation Education comprehension: verbalized understanding  HOME EXERCISE PROGRAM: *Patient has MedBridge GO app  Access Code: [email or web address] URL: [email or web address] Date: 02/18/2024 Prepared by: Felecia Hopper  Exercises - Standing Shoulder Posterior Capsule Stretch  - 1-2 x daily - 7 x weekly - 3 reps - 30 sec hold - Seated Thoracic Lumbar Extension with Pectoralis Stretch  - 1 x daily - 7 x weekly - 2 sets - 10 reps - 3 sec hold - Seated Scapular Retraction with External Rotation  - 1 x daily - 7 x weekly - 2 sets - 10 reps - 3 sec hold - Open Book Chest Stretch on Towel Roll  - 1 x daily - 7 x weekly - 2 sets - 10 reps - 3 sec hold - Supine Shoulder Horizontal Abduction with Resistance  - 1 x daily - 3 x weekly - 2 sets - 10 reps - 3 sec hold - Low Trap Setting at Wall  - 1 x daily - 3 x weekly - 2 sets - 10 reps - 3 sec hold - Prone Single Arm Shoulder Horizontal Abduction with Scapular Retraction and Palm Down  - 1 x daily - 3 x weekly - 2 sets - 10 reps - 3 sec hold - Prone Shoulder Extension - Single Arm  - 1 x daily - 3 x weekly - 2 sets - 10 reps - 3 sec hold - Prone Single Arm Shoulder Y  - 1 x daily - 3 x weekly - 2 sets - 10 reps - 3 sec hold - Staggered Stance Scapular Retraction and Depression with Resistance  - 1 x daily - 3 x weekly - 2 sets - 10 reps - 3 sec hold - Theracane Over Shoulder  - 1-2 x daily - 7 x weekly - 1-2 min hold  Patient Education - Trigger Point Dry Needling  Access Code from prior PT episode: [id number]    ASSESSMENT:  CLINICAL IMPRESSION: Robin Schmidt reports her pain remains unchanged with majority of recent pain ratings at 8/10.  Her R shoulder ROM has fluctuated over the course of this PT episode with today's measurements even lower than her baseline.  Some gains noted in B shoulder strength with shoulder MMT 4 to 4+/5, however continued significant scapular weakness persisting bilaterally with ongoing forward  flexed and rounded posture.  QuickDASH reveals declining function with currently disability at 63.6% as compared to 52.3% at baseline on eval.  Lack of consistent progress discussed with Robin Schmidt and recommended discharge from PT with patient to f/u with Dr. Brunilda Capra following upcoming MRI of R  shoulder.  OBJECTIVE IMPAIRMENTS: decreased activity tolerance, decreased endurance, decreased knowledge of condition, decreased mobility, decreased ROM, decreased strength, hypomobility, increased fascial restrictions, impaired perceived functional ability, increased muscle spasms, impaired flexibility, impaired sensation, impaired UE functional use, improper body mechanics, postural dysfunction, and pain.   ACTIVITY LIMITATIONS: carrying, lifting, sleeping, bed mobility, bathing, dressing, reach over head, hygiene/grooming, and caring for others  PARTICIPATION LIMITATIONS: meal prep, cleaning, laundry, shopping, and community activity  PERSONAL FACTORS: Behavior pattern, Fitness, Past/current experiences, Time since onset of injury/illness/exacerbation, and 3+ comorbidities: R shoulder RCR 12/14/22, C5-6 ACDF 2015, Cervical radiculopathy, R CTS, L shoulder surgery - SAD/DCR 08/06/15, Chronic back pain, Lumbar fusion, Anxiety, Depression, OSA  are also affecting patient's functional outcome.   REHAB POTENTIAL: Good  CLINICAL DECISION MAKING: Unstable/unpredictable  EVALUATION COMPLEXITY: High   GOALS: Goals reviewed with patient? Yes  SHORT TERM GOALS: Target date: 01/21/2024  Patient will be independent with initial HEP to improve outcomes and carryover.  Baseline:  Goal status: MET - 01/25/24    2.  Patient will report 25% improvement in R shoulder pain to improve QOL.   Baseline: 8-9/10 Goal status: NOT MET - 02/22/24 - pain essentially unchanged (some days better, some days worse)  LONG TERM GOALS: Target date: 02/25/2024  Patient will be independent with ongoing/advanced HEP for self-management at  home.  Baseline:  Goal status: MET - 02/22/24 - HEP reviewed over last 2 visits and pt declines need for further review  2.  Patient will report 50-75% improvement in R shoulder pain to improve QOL.  Baseline: 8-9/10 Goal status: NOT MET - 02/22/24 - pain essentially unchanged (some days better, some days worse)  3.  Patient to demonstrate improved upright posture with posterior shoulder girdle engaged to promote improved glenohumeral joint mobility. Baseline:  Goal status: NOT MET - 02/22/24 - Pt persists with fwd head and rounded shoulder posture with increased postural kyphosis  4.  Patient to improve R shoulder AROM to [facility] without pain provocation to allow for increased ease of ADLs.  Baseline: Refer to above UE ROM table Goal status: NOT MET - 02/22/24 R shoulder flexion increased  5.  Patient will demonstrate improved B shoulder and scapular strength to >/= 4 to 4+/5 for functional UE use. Baseline: Refer to above UE MMT table Goal status: PARTIALLY MET - 02/22/24 - met for shoulder but not scapular muscles  6  Patient will report </= 42% on QuickDASH to demonstrate improved functional ability.  Baseline: 52.3 / 100 = 52.3 %; 02/01/24 - 65.9 / 100 = 65.9 % Goal status: NOT MET -  02/22/24 = 63.6 / 100 = 63.6 %   PLAN:  PT FREQUENCY: 2x/week  PT DURATION: 4-8 weeks  PLANNED INTERVENTIONS: 97164- PT Re-evaluation, 97110-Therapeutic exercises, 97530- Therapeutic activity, 97112- Neuromuscular re-education, 97535- Self Care, [id number]- Manual therapy, [id number]- Electrical stimulation (unattended), [id number]- Electrical stimulation (manual), 97016- Vasopneumatic device, 97035- Ultrasound, 81191- Ionotophoresis 4mg /ml Dexamethasone, Patient/Family education, Taping, Dry Needling, Joint mobilization, Spinal mobilization, Cryotherapy, and Moist heat  PLAN FOR NEXT SESSION: transition to HEP + discharge from PT    PHYSICAL THERAPY DISCHARGE SUMMARY  Visits from Start of Care: 11  Current  functional level related to goals / functional outcomes: Refer to above clinical impression and goal assessment.    Remaining deficits: As above.   Education / Equipment: HEP, postural education   Patient agrees to discharge. Patient goals were partially met. Patient is being discharged due to lack of progress.  Marry Guan,  PT 02/22/2024, 2:34 PM

## 2024-02-23 DIAGNOSIS — M25511 Pain in right shoulder: Secondary | ICD-10-CM | POA: Diagnosis not present

## 2024-02-27 ENCOUNTER — Emergency Department (HOSPITAL_BASED_OUTPATIENT_CLINIC_OR_DEPARTMENT_OTHER)
Admission: EM | Admit: 2024-02-27 | Discharge: 2024-02-27 | Disposition: A | Discharge status: Home / Self Care | Attending: Emergency Medicine | Admitting: Emergency Medicine

## 2024-02-27 ENCOUNTER — Encounter (HOSPITAL_BASED_OUTPATIENT_CLINIC_OR_DEPARTMENT_OTHER): Payer: Self-pay

## 2024-02-27 ENCOUNTER — Emergency Department (HOSPITAL_BASED_OUTPATIENT_CLINIC_OR_DEPARTMENT_OTHER)

## 2024-02-27 ENCOUNTER — Other Ambulatory Visit: Payer: Self-pay

## 2024-02-27 DIAGNOSIS — M79601 Pain in right arm: Secondary | ICD-10-CM | POA: Diagnosis not present

## 2024-02-27 DIAGNOSIS — M5410 Radiculopathy, site unspecified: Secondary | ICD-10-CM | POA: Insufficient documentation

## 2024-02-27 DIAGNOSIS — M542 Cervicalgia: Secondary | ICD-10-CM | POA: Diagnosis not present

## 2024-02-27 DIAGNOSIS — R9431 Abnormal electrocardiogram [ECG] [EKG]: Secondary | ICD-10-CM | POA: Diagnosis not present

## 2024-02-27 DIAGNOSIS — M5412 Radiculopathy, cervical region: Secondary | ICD-10-CM | POA: Diagnosis not present

## 2024-02-27 DIAGNOSIS — I700 Atherosclerosis of aorta: Secondary | ICD-10-CM | POA: Diagnosis not present

## 2024-02-27 LAB — BASIC METABOLIC PANEL WITH GFR
Anion gap: 9 (ref 5–15)
BUN: 20 mg/dL (ref 6–20)
CO2: 25 mmol/L (ref 22–32)
Calcium: 9 mg/dL (ref 8.9–10.3)
Chloride: 105 mmol/L (ref 98–111)
Creatinine, Ser: 0.94 mg/dL (ref 0.44–1.00)
GFR, Estimated: >60 mL/min (ref >60)
Glucose, Bld: 103 mg/dL — ABNORMAL HIGH (ref 70–99)
Potassium: 4.3 mmol/L (ref 3.5–5.1)
Sodium: 139 mmol/L (ref 135–145)

## 2024-02-27 LAB — CBC
HCT: 41.1 % (ref 36.0–46.0)
Hemoglobin: 13.2 g/dL (ref 12.0–15.0)
MCH: 27.6 pg (ref 26.0–34.0)
MCHC: 32.1 g/dL (ref 30.0–36.0)
MCV: 86 fL (ref 80.0–100.0)
Platelets: 226 10*3/uL (ref 150–400)
RBC: 4.78 MIL/uL (ref 3.87–5.11)
RDW: 14.7 % (ref 11.5–15.5)
WBC: 7.7 10*3/uL (ref 4.0–10.5)
nRBC: 0 % (ref 0.0–0.2)

## 2024-02-27 LAB — TROPONIN I (HIGH SENSITIVITY): Troponin I (High Sensitivity): <2 ng/L (ref <18)

## 2024-02-27 MED ORDER — METHYLPREDNISOLONE 4 MG PO TBPK: ORAL_TABLET | ORAL | 0 refills | Status: DC

## 2024-02-27 MED ORDER — OXYCODONE-ACETAMINOPHEN 5-325 MG PO TABS
1.0000 | ORAL_TABLET | Freq: Once | ORAL | Status: AC
  Administered 2024-02-27: 1 via ORAL
  Filled 2024-02-27: qty 1

## 2024-02-27 MED ORDER — OXYCODONE HCL 5 MG PO TABS: 5.0000 mg | ORAL_TABLET | Freq: Four times a day (QID) | ORAL | 0 refills | Status: DC | PRN

## 2024-02-27 NOTE — ED Triage Notes (Signed)
 Reports worsening upper back/neck pain. Pain worse w movement.   States she dx w pinched nerve 2 months ago

## 2024-02-27 NOTE — ED Notes (Signed)
 Second Trop not needed. Per EDP .

## 2024-02-27 NOTE — Discharge Instructions (Addendum)
 As discussed, will place you on a Medrol  Dosepak as well as give you medicine for pain.  You may take this in addition to your other medicines you are on.  Recommend follow-up with your spinal doctor in the outpatient setting.  Please do not hesitate to return if the worrisome signs and symptoms we discussed become apparent.

## 2024-02-27 NOTE — ED Provider Notes (Signed)
 [facility] Provider Note   CSN: [id number] Arrival date & time: 02/27/24  1830     History  Chief Complaint  Patient presents with   Back Pain    Robin Schmidt is a 58 y.o. female.   Back Pain   58 year old female presents emergency department with complaints of neck pain with radiation to her shoulder on the right.  States that she has had the symptoms since last night which worsened today.  States that she felt like she may have slept on her neck/shoulder wrong.  Does have history of rotator cuff tear with repair in February 2024.  States that she has had residual range of motion difficulties ever since procedure.  Does also report history of cervical decompression with discectomy/fusion.  Follows with neurosurgery and had MRI/CT of the cervical spine last month which she states the surgeon told her were "reassuring."  Does report baseline decree sensation in bilateral hands as well as weakness in hand secondary to bilateral carpal tunnel per patient.  Denies any new weakness or sensory deficits in upper extremities.  Does report the pain worsened with certain movements.  Denies any exertional worsening of symptoms.  Denies any fevers, chills, cough, shortness of breath.  States that she usually gets steroid, pain medicine whenever her "neck flares up like this" which helps.  Past medical history significant for rotator cuff tear, cervical decompression/discectomy.  Home Medications Prior to Admission medications   Medication Sig Start Date End Date Taking? Authorizing Provider  methylPREDNISolone  (MEDROL  DOSEPAK) 4 MG TBPK tablet See box instructions 02/27/24  Yes Neil Balls A, PA  oxyCODONE  (ROXICODONE ) 5 MG immediate release tablet Take 1 tablet (5 mg total) by mouth every 6 (six) hours as needed for severe pain (pain score 7-10). 02/27/24  Yes Neil Balls A, PA  ascorbic acid (VITAMIN C) 1000 MG tablet Take by mouth.     [provider]  calcium citrate-vitamin D  (CITRACAL+D) 315-200 MG-UNIT tablet Take by mouth.    [provider]  celecoxib (CELEBREX) 200 MG capsule TAKE 1 CAPSULE BY MOUTH EVERY DAY AS NEEDED 11/11/22   [provider]  cyclobenzaprine (FLEXERIL) 10 MG tablet Take 10 mg by mouth 3 (three) times daily as needed for muscle spasms.    [provider]  diclofenac  Sodium (VOLTAREN ) 1 % GEL Apply a small amount to areas of concern twice daily 11/17/23   Drubel, Heidi Llamas, PA-C  DULoxetine  (CYMBALTA ) 30 MG capsule Take 30 mg by mouth daily.    [provider]  ergocalciferol  (VITAMIN D2) 1.25 MG (50000 UT) capsule Take 50,000 Units by mouth daily in the afternoon.    [provider]  gabapentin  (NEURONTIN ) 300 MG capsule Take 300 mg by mouth 2 (two) times daily.      [provider]  hydrOXYzine (ATARAX) 50 MG tablet Take 50 mg by mouth 2 (two) times daily as needed for anxiety.    [provider]  methocarbamol (ROBAXIN) 500 MG tablet Take 1 tablet every 6-8 hours by oral route as needed for spasm. 12/14/22   [provider]  Multiple Vitamins-Minerals (MULTIVITAMIN WITH MINERALS) tablet Take by mouth.    [provider]  Omega-3 1000 MG CAPS Take by mouth.    [provider]  phentermine  37.5 MG capsule Take 1 capsule (37.5 mg total) by mouth every morning. 11/17/23   Trenton Frock, PA-C      Allergies    Patient has no  known allergies.    Review of Systems   Review of Systems  Musculoskeletal:  Positive for back pain.  All other systems reviewed and are negative.   Physical Exam Updated Vital Signs BP (!) 133/90   Pulse 78   Temp 98.2 F (36.8 C) (Oral)   Resp 20   Ht 5\' 6"  (1.676 m)   Wt 86.6 kg   LMP 03/12/2016   SpO2 100%   BMI 30.83 kg/m  Physical Exam Vitals and nursing note reviewed.  Constitutional:      General: She is not in acute distress.    Appearance: She is well-developed.   HENT:     Head: Normocephalic and atraumatic.  Eyes:     Conjunctiva/sclera: Conjunctivae normal.  Cardiovascular:     Rate and Rhythm: Normal rate and regular rhythm.     Heart sounds: No murmur heard. Pulmonary:     Effort: Pulmonary effort is normal. No respiratory distress.     Breath sounds: Normal breath sounds. No wheezing, rhonchi or rales.  Abdominal:     Palpations: Abdomen is soft.     Tenderness: There is no abdominal tenderness.  Musculoskeletal:        General: No swelling.     Cervical back: Normal range of motion and neck supple.     Comments: Paraspinal tenderness noted bilaterally in cervical region with right greater than left.  Tenderness along right trapezius ridge.  Patient able to range right shoulder fully but with pain overhead movements symmetric strength bilateral extremities including elbow flexion/extension.  Patient reporting decreased sensation palmar aspect of hands but with otherwise, intact sensation along major nerve distributions of upper extremities.  Radial pulses 2+ bilaterally.  Skin:    General: Skin is warm and dry.     Capillary Refill: Capillary refill takes less than 2 seconds.  Neurological:     Mental Status: She is alert.  Psychiatric:        Mood and Affect: Mood normal.     ED Results / Procedures / Treatments   Labs (all labs ordered are listed, but only abnormal results are displayed) Labs Reviewed  BASIC METABOLIC PANEL WITH GFR - Abnormal; Notable for the following components:      Result Value   Glucose, Bld 103 (*)    All other components within normal limits  CBC  TROPONIN I (HIGH SENSITIVITY)  TROPONIN I (HIGH SENSITIVITY)    EKG EKG Interpretation Date/Time:  Sunday February 27 2024 20:43:13 EDT Ventricular Rate:  72 PR Interval:  155 QRS Duration:  96 QT Interval:  399 QTC Calculation: 437 R Axis:   83  Text Interpretation: Sinus rhythm Consider left atrial enlargement Confirmed by Jerald Molly [phone]) on  02/27/2024 10:39:42 PM  Radiology DG Chest 2 View Result Date: 02/27/2024 CLINICAL DATA:  Worsening upper back and neck pain. EXAM: CHEST - 2 VIEW COMPARISON:  03/28/2016 FINDINGS: Stable cardiomediastinal silhouette. Aortic atherosclerotic calcification. Left basilar atelectasis/scarring. No focal consolidation, pleural effusion, or pneumothorax. No displaced rib fractures. IMPRESSION: No acute cardiopulmonary disease. Electronically Signed   By: Rozell Cornet M.D.   On: 02/27/2024 22:11    Procedures Procedures    Medications Ordered in ED Medications  oxyCODONE -acetaminophen  (PERCOCET/ROXICET) 5-325 MG per tablet 1 tablet (1 tablet Oral Given 02/27/24 2049)    ED Course/ Medical Decision Making/ A&P  Medical Decision Making Amount and/or Complexity of Data Reviewed Labs: ordered. Radiology: ordered.  Risk Prescription drug management.   This patient presents to the ED for concern of right-sided neck pain, this involves an extensive number of treatment options, and is a complaint that carries with it a high risk of complications and morbidity.  The differential diagnosis includes cervical area pain, ACS, PE, fracture, d meningitis, other   Co morbidities that complicate the patient evaluation  See HPI   Additional history obtained:  Additional history obtained from EMR External records from outside source obtained and reviewed including hospital records   Lab Tests:  I Ordered, and personally interpreted labs.  The pertinent results include: Negative troponin.  No leukocytosis.  No evidence of anemia.  Platelets within range.  No transaminitis.  No renal dysfunction.   Imaging Studies ordered:  I ordered imaging studies including chest x-ray I independently visualized and interpreted imaging which showed no acute cardiopulmonary abnormality I agree with the radiologist interpretation   Cardiac Monitoring: / EKG:  The patient  was maintained on a cardiac monitor.  I personally viewed and interpreted the cardiac monitored which showed an underlying rhythm of: sinus rhythm. LAE   Consultations Obtained:  N/a   Problem List / ED Course / Critical interventions / Medication management  Radicular pain I ordered medication including Percocet   Reevaluation of the patient after these medicines showed that the patient improved I have reviewed the patients home medicines and have made adjustments as needed   Social Determinants of Health:  Denies tobacco, illicit drug use.   Test / Admission - Considered:  Radicular pain Vitals signs within normal range and stable throughout visit. Laboratory/imaging studies significant for: See above 58 year old female presents emergency department with complaints of neck pain with radiation to her shoulder on the right.  States that she has had the symptoms since last night which worsened today.  States that she felt like she may have slept on her neck/shoulder wrong.  Does have history of rotator cuff tear with repair in February 2024.  States that she has had residual range of motion difficulties ever since procedure.  Does also report history of cervical decompression with discectomy/fusion.  Follows with neurosurgery and had MRI/CT of the cervical spine last month which she states the surgeon told her were "reassuring."  Does report baseline decree sensation in bilateral hands as well as weakness in hand secondary to bilateral carpal tunnel per patient.  Denies any new weakness or sensory deficits in upper extremities.  Does report the pain worsened with certain movements.  Denies any exertional worsening of symptoms.  Denies any fevers, chills, cough, shortness of breath.  States that she usually gets steroid, pain medicine whenever her "neck flares up like this" which helps. On exam, paraspinal tenderness to palpation appreciated in cervical region with tenderness along right  trapezius region.  Full range of motion of as well as right shoulder.  No overlying skin changes concerning for secondary infectious process.  Patient without chest pain rating to back, pulse deficits, neurodeficits, hypertension; low suspicion for aortic dissection.  Troponin negative, lack of acute ischemic change on EKG; low suspicion for ACS.  Heart score of 0-3.  Patient without risk factors for PE, lack of tachycardia, tachypnea, hypoxia, pleuritic pain; low suspicion for PE.  Suspect patient's symptoms are likely from cervical radiculopathy.  Patient without any acute weakness or sensory deficits in upper extremities.  Offered imaging of patient's cervical spine but this was  deferred given the patient most recently had MRI and CT cervical spine that were done less than a month ago with results relayed to patient that were "reassuring" although official need not readily available.  Will trial corticosteroids and have patient follow-up with neurosurgery in the outpatient setting.  Treatment plan discussed with patient and she acknowledged understanding was agreeable to said plan.  Patient will well-appearing, afebrile in no acute distress. Worrisome signs and symptoms were discussed with the patient, and the patient acknowledged understanding to return to the ED if noticed. Patient was stable upon discharge.          Final Clinical Impression(s) / ED Diagnoses Final diagnoses:  Radicular pain    Rx / DC Orders      [place], [place] 02/27/24 2247    Arvilla Birmingham, MD 02/27/24 [phone]

## 2024-02-28 ENCOUNTER — Telehealth: Admitting: Physician Assistant

## 2024-02-28 DIAGNOSIS — R3989 Other symptoms and signs involving the genitourinary system: Secondary | ICD-10-CM

## 2024-02-28 DIAGNOSIS — J302 Other seasonal allergic rhinitis: Secondary | ICD-10-CM | POA: Diagnosis not present

## 2024-02-28 MED ORDER — LORATADINE 10 MG PO TABS: 10.0000 mg | ORAL_TABLET | Freq: Every day | ORAL | 0 refills | Status: AC

## 2024-02-28 MED ORDER — FLUTICASONE PROPIONATE 50 MCG/ACT NA SUSP: 2.0000 | Freq: Every day | NASAL | 0 refills | Status: AC

## 2024-02-28 MED ORDER — NITROFURANTOIN MONOHYD MACRO 100 MG PO CAPS: 100.0000 mg | ORAL_CAPSULE | Freq: Two times a day (BID) | ORAL | 0 refills | Status: DC

## 2024-02-28 MED ORDER — MONTELUKAST SODIUM 10 MG PO TABS: 10.0000 mg | ORAL_TABLET | Freq: Every day | ORAL | 0 refills | Status: AC

## 2024-02-28 NOTE — Progress Notes (Signed)
 Virtual Visit Consent   Robin Schmidt, you are scheduled for a virtual visit with a [facility] provider today. Just as with appointments in the office, your consent must be obtained to participate. Your consent will be active for this visit and any virtual visit you may have with one of our providers in the next 365 days. If you have a MyChart account, a copy of this consent can be sent to you electronically.  As this is a virtual visit, video technology does not allow for your provider to perform a traditional examination. This may limit your provider's ability to fully assess your condition. If your provider identifies any concerns that need to be evaluated in person or the need to arrange testing (such as labs, EKG, etc.), we will make arrangements to do so. Although advances in technology are sophisticated, we cannot ensure that it will always work on either your end or our end. If the connection with a video visit is poor, the visit may have to be switched to a telephone visit. With either a video or telephone visit, we are not always able to ensure that we have a secure connection.  By engaging in this virtual visit, you consent to the provision of healthcare and authorize for your insurance to be billed (if applicable) for the services provided during this visit. Depending on your insurance coverage, you may receive a charge related to this service.  I need to obtain your verbal consent now. Are you willing to proceed with your visit today? [name] has provided verbal consent on 02/28/2024 for a virtual visit (video or telephone). Angelia Kelp, PA-C  Date: 02/28/2024 1:11 PM   Virtual Visit via Video Note   I, Angelia Kelp, connected with  [name]  ([id number], 1966-06-24) on 02/28/24 at 12:30 PM EDT by a video-enabled telemedicine application and verified that I am speaking with the correct person using two identifiers.  Location: Patient: Virtual Visit  Location Patient: Home Provider: Virtual Visit Location Provider: Home Office   I discussed the limitations of evaluation and management by telemedicine and the availability of in person appointments. The patient expressed understanding and agreed to proceed.    History of Present Illness: ARIELY Schmidt is a 58 y.o. who identifies as a female who was assigned female at birth, and is being seen today for urine urgency and allergies.  HPI: Urinary Tract Infection  This is a new problem. The current episode started in the past 7 days (5 days). The problem occurs every urination. The problem has been unchanged. The patient is experiencing no pain. There has been no fever. There is No history of pyelonephritis. Associated symptoms include flank pain (mild on the right), frequency, hesitancy and urgency. Pertinent negatives include no chills, discharge, hematuria, nausea or sweats. She has tried increased fluids (cranberry) for the symptoms. The treatment provided no relief. There is no history of catheterization, kidney stones, recurrent UTIs or a urological procedure.    Seasonal Allergies: Sneezing, drainage, coughing.   Problems:  Patient Active Problem List   Diagnosis Date Noted   Obesity (BMI 30-39.9) 11/17/2023   Full thickness rotator cuff tear 11/19/2022   Tinnitus, right 07/08/2017   Carpal tunnel syndrome 07/17/2014   Obstructive sleep apnea 07/17/2014   Radiculopathy 11/22/2013   Menometrorrhagia 06/08/2012    Class: Chronic   Chronic lower back pain 04/01/2007    Allergies: No Known Allergies Medications:  Current Outpatient Medications:    fluticasone  (  FLONASE ) 50 MCG/ACT nasal spray, Place 2 sprays into both nostrils daily., Disp: 16 g, Rfl: 0   loratadine  (CLARITIN ) 10 MG tablet, Take 1 tablet (10 mg total) by mouth daily., Disp: 90 tablet, Rfl: 0   montelukast  (SINGULAIR ) 10 MG tablet, Take 1 tablet (10 mg total) by mouth at bedtime., Disp: 30 tablet, Rfl: 0    nitrofurantoin , macrocrystal-monohydrate, (MACROBID ) 100 MG capsule, Take 1 capsule (100 mg total) by mouth 2 (two) times daily., Disp: 10 capsule, Rfl: 0   ascorbic acid (VITAMIN C) 1000 MG tablet, Take by mouth., Disp: , Rfl:    calcium citrate-vitamin D  (CITRACAL+D) 315-200 MG-UNIT tablet, Take by mouth., Disp: , Rfl:    celecoxib (CELEBREX) 200 MG capsule, TAKE 1 CAPSULE BY MOUTH EVERY DAY AS NEEDED, Disp: , Rfl:    cyclobenzaprine (FLEXERIL) 10 MG tablet, Take 10 mg by mouth 3 (three) times daily as needed for muscle spasms., Disp: , Rfl:    diclofenac  Sodium (VOLTAREN ) 1 % GEL, Apply a small amount to areas of concern twice daily, Disp: 150 g, Rfl: 2   DULoxetine  (CYMBALTA ) 30 MG capsule, Take 30 mg by mouth daily., Disp: , Rfl:    ergocalciferol  (VITAMIN D2) 1.25 MG (50000 UT) capsule, Take 50,000 Units by mouth daily in the afternoon., Disp: , Rfl:    gabapentin  (NEURONTIN ) 300 MG capsule, Take 300 mg by mouth 2 (two) times daily.  , Disp: , Rfl:    hydrOXYzine (ATARAX) 50 MG tablet, Take 50 mg by mouth 2 (two) times daily as needed for anxiety., Disp: , Rfl:    methocarbamol (ROBAXIN) 500 MG tablet, Take 1 tablet every 6-8 hours by oral route as needed for spasm., Disp: , Rfl:    methylPREDNISolone  (MEDROL  DOSEPAK) 4 MG TBPK tablet, See box instructions, Disp: 21 each, Rfl: 0   Multiple Vitamins-Minerals (MULTIVITAMIN WITH MINERALS) tablet, Take by mouth., Disp: , Rfl:    Omega-3 1000 MG CAPS, Take by mouth., Disp: , Rfl:    oxyCODONE  (ROXICODONE ) 5 MG immediate release tablet, Take 1 tablet (5 mg total) by mouth every 6 (six) hours as needed for severe pain (pain score 7-10)., Disp: 10 tablet, Rfl: 0   phentermine  37.5 MG capsule, Take 1 capsule (37.5 mg total) by mouth every morning., Disp: 30 capsule, Rfl: 3  Observations/Objective: Patient is well-developed, well-nourished in no acute distress.  Resting comfortably at home.  Head is normocephalic, atraumatic.  No labored breathing.   Speech is clear and coherent with logical content.  Patient is alert and oriented at baseline.    Assessment and Plan: 1. Suspected UTI (Primary) - nitrofurantoin , macrocrystal-monohydrate, (MACROBID ) 100 MG capsule; Take 1 capsule (100 mg total) by mouth 2 (two) times daily.  Dispense: 10 capsule; Refill: 0  2. Seasonal allergies - fluticasone  (FLONASE ) 50 MCG/ACT nasal spray; Place 2 sprays into both nostrils daily.  Dispense: 16 g; Refill: 0 - loratadine  (CLARITIN ) 10 MG tablet; Take 1 tablet (10 mg total) by mouth daily.  Dispense: 90 tablet; Refill: 0 - montelukast  (SINGULAIR ) 10 MG tablet; Take 1 tablet (10 mg total) by mouth at bedtime.  Dispense: 30 tablet; Refill: 0  - Worsening symptoms.  - Will treat empirically with Macrobid  - May use AZO for bladder spasms - Continue to push fluids.  - Seek in person evaluation for urine culture if symptoms do not improve or if they worsen.   - Flonase , Loratadine , and Singulair  prescribed for seasonal allergies  Follow Up Instructions: I discussed the assessment  and treatment plan with the patient. The patient was provided an opportunity to ask questions and all were answered. The patient agreed with the plan and demonstrated an understanding of the instructions.  A copy of instructions were sent to the patient via MyChart unless otherwise noted below.    The patient was advised to call back or seek an in-person evaluation if the symptoms worsen or if the condition fails to improve as anticipated.    Angelia Kelp, PA-C

## 2024-02-28 NOTE — Patient Instructions (Signed)
 Robin Schmidt, thank you for joining Robin Kelp, PA-C for today's virtual visit.  While this provider is not your primary care provider (PCP), if your PCP is located in our provider database this encounter information will be shared with them immediately following your visit.   A [place] MyChart account gives you access to today's visit and all your visits, tests, and labs performed at [facility] " click here if you don't have a [place] MyChart account or go to [email or web address]  Consent: (Patient) Robin Schmidt provided verbal consent for this virtual visit at the beginning of the encounter.  Current Medications:  Current Outpatient Medications:    fluticasone  (FLONASE ) 50 MCG/ACT nasal spray, Place 2 sprays into both nostrils daily., Disp: 16 g, Rfl: 0   loratadine  (CLARITIN ) 10 MG tablet, Take 1 tablet (10 mg total) by mouth daily., Disp: 90 tablet, Rfl: 0   montelukast  (SINGULAIR ) 10 MG tablet, Take 1 tablet (10 mg total) by mouth at bedtime., Disp: 30 tablet, Rfl: 0   nitrofurantoin , macrocrystal-monohydrate, (MACROBID ) 100 MG capsule, Take 1 capsule (100 mg total) by mouth 2 (two) times daily., Disp: 10 capsule, Rfl: 0   ascorbic acid (VITAMIN C) 1000 MG tablet, Take by mouth., Disp: , Rfl:    calcium citrate-vitamin D  (CITRACAL+D) 315-200 MG-UNIT tablet, Take by mouth., Disp: , Rfl:    celecoxib (CELEBREX) 200 MG capsule, TAKE 1 CAPSULE BY MOUTH EVERY DAY AS NEEDED, Disp: , Rfl:    cyclobenzaprine (FLEXERIL) 10 MG tablet, Take 10 mg by mouth 3 (three) times daily as needed for muscle spasms., Disp: , Rfl:    diclofenac  Sodium (VOLTAREN ) 1 % GEL, Apply a small amount to areas of concern twice daily, Disp: 150 g, Rfl: 2   DULoxetine  (CYMBALTA ) 30 MG capsule, Take 30 mg by mouth daily., Disp: , Rfl:    ergocalciferol  (VITAMIN D2) 1.25 MG (50000 UT) capsule, Take 50,000 Units by mouth daily in the afternoon., Disp: , Rfl:    gabapentin  (NEURONTIN )  300 MG capsule, Take 300 mg by mouth 2 (two) times daily.  , Disp: , Rfl:    hydrOXYzine (ATARAX) 50 MG tablet, Take 50 mg by mouth 2 (two) times daily as needed for anxiety., Disp: , Rfl:    methocarbamol (ROBAXIN) 500 MG tablet, Take 1 tablet every 6-8 hours by oral route as needed for spasm., Disp: , Rfl:    methylPREDNISolone  (MEDROL  DOSEPAK) 4 MG TBPK tablet, See box instructions, Disp: 21 each, Rfl: 0   Multiple Vitamins-Minerals (MULTIVITAMIN WITH MINERALS) tablet, Take by mouth., Disp: , Rfl:    Omega-3 1000 MG CAPS, Take by mouth., Disp: , Rfl:    oxyCODONE  (ROXICODONE ) 5 MG immediate release tablet, Take 1 tablet (5 mg total) by mouth every 6 (six) hours as needed for severe pain (pain score 7-10)., Disp: 10 tablet, Rfl: 0   phentermine  37.5 MG capsule, Take 1 capsule (37.5 mg total) by mouth every morning., Disp: 30 capsule, Rfl: 3   Medications ordered in this encounter:  Meds ordered this encounter  Medications   fluticasone  (FLONASE ) 50 MCG/ACT nasal spray    Sig: Place 2 sprays into both nostrils daily.    Dispense:  16 g    Refill:  0    Supervising Provider:   Corine Dice [[id number]]   loratadine  (CLARITIN ) 10 MG tablet    Sig: Take 1 tablet (10 mg total) by mouth daily.    Dispense:  90 tablet  Refill:  0    Supervising Provider:   Corine Dice [[id number]]   montelukast  (SINGULAIR ) 10 MG tablet    Sig: Take 1 tablet (10 mg total) by mouth at bedtime.    Dispense:  30 tablet    Refill:  0    Supervising Provider:   LAMPTEY, PHILIP O [[id number]]   nitrofurantoin , macrocrystal-monohydrate, (MACROBID ) 100 MG capsule    Sig: Take 1 capsule (100 mg total) by mouth 2 (two) times daily.    Dispense:  10 capsule    Refill:  0    Supervising Provider:   Corine Dice [[id number]]     *If you need refills on other medications prior to your next appointment, please contact your pharmacy*  Follow-Up: Call back or seek an in-person evaluation if the symptoms worsen or  if the condition fails to improve as anticipated.  [facility] [phone]  Other Instructions Urinary Tract Infection, Female A urinary tract infection (UTI) is an infection in your urinary tract. The urinary tract is made up of organs that make, store, and get rid of pee (urine) in your body. These organs include: The kidneys. The ureters. The bladder. The urethra. What are the causes? Most UTIs are caused by germs called bacteria. They may be in or near your genitals. These germs grow and cause swelling in your urinary tract. What increases the risk? You're more likely to get a UTI if: You're a female. The urethra is shorter in females than in males. You have a soft tube called a catheter that drains your pee. You can't control when you pee or poop. You have trouble peeing because of: A kidney stone. A urinary blockage. A nerve condition that affects your bladder. Not getting enough to drink. You're sexually active. You use a birth control inside your vagina, like spermicide. You're pregnant. You have low levels of the hormone estrogen in your body. You're an older adult. You're also more likely to get a UTI if you have other health problems. These may include: Diabetes. A weak immune system. Your immune system is your body's defense system. Sickle cell disease. Injury of the spine. What are the signs or symptoms? Symptoms may include: Needing to pee right away. Peeing small amounts often. Pain or burning when you pee. Blood in your pee. Pee that smells bad or odd. Pain in your belly or lower back. You may also: Feel confused. This may be the first symptom in older adults. Vomit. Not feel hungry. Feel tired or easily annoyed. Have a fever or chills. How is this diagnosed? A UTI is diagnosed based on your medical history and an exam. You may also have other tests. These may include: Pee tests. Blood tests. Tests for sexually transmitted infections  (STIs). If you've had more than one UTI, you may need to have imaging studies done to find out why you keep getting them. How is this treated? A UTI can be treated by: Taking antibiotics or other medicines. Drinking enough fluid to keep your pee pale yellow. In rare cases, a UTI can cause a very bad condition called sepsis. Sepsis may be treated in the hospital. Follow these instructions at home: Medicines Take your medicines only as told by your health care provider. If you were given antibiotics, take them as told by your provider. Do not stop taking them even if you start to feel better. General instructions Make sure you: Pee often and fully. Do not hold your pee for  a long time. Wipe from front to back after you pee or poop. Use each tissue only once when you wipe. Pee after you have sex. Do not douche or use sprays or powders in your genital area. Contact a health care provider if: Your symptoms don't get better after 1-2 days of taking antibiotics. Your symptoms go away and then come back. You have a fever or chills. You vomit or feel like you may vomit. Get help right away if: You have very bad pain in your back or lower belly. You faint. This information is not intended to replace advice given to you by your health care provider. Make sure you discuss any questions you have with your health care provider. Document Revised: 06/03/2023 Document Reviewed: 01/29/2023 Elsevier Patient Education  2024 Elsevier Inc.   If you have been instructed to have an in-person evaluation today at a local Urgent Care facility, please use the link below. It will take you to a list of all of our available [facility], including address, phone number and hours of operation. Please do not delay care.  [facility]  If you or a family member do not have a primary care provider, use the link below to schedule a visit and establish care. When you choose a [facility] primary  care physician or advanced practice provider, you gain a long-term partner in health. Find a Primary Care Provider  Learn more about [place]'s in-office and virtual care options: [place] - Get Care Now

## 2024-03-01 DIAGNOSIS — G5601 Carpal tunnel syndrome, right upper limb: Secondary | ICD-10-CM | POA: Diagnosis not present

## 2024-03-01 DIAGNOSIS — M25511 Pain in right shoulder: Secondary | ICD-10-CM | POA: Diagnosis not present

## 2024-03-01 DIAGNOSIS — M5030 Other cervical disc degeneration, unspecified cervical region: Secondary | ICD-10-CM | POA: Diagnosis not present

## 2024-03-04 ENCOUNTER — Telehealth: Admitting: Physician Assistant

## 2024-03-04 DIAGNOSIS — R109 Unspecified abdominal pain: Secondary | ICD-10-CM

## 2024-03-04 DIAGNOSIS — M549 Dorsalgia, unspecified: Secondary | ICD-10-CM

## 2024-03-04 NOTE — Progress Notes (Signed)
 Virtual Visit Consent   Robin Schmidt, you are scheduled for a virtual visit with a [facility] provider today. Just as with appointments in the office, your consent must be obtained to participate. Your consent will be active for this visit and any virtual visit you may have with one of our providers in the next 365 days. If you have a MyChart account, a copy of this consent can be sent to you electronically.  As this is a virtual visit, video technology does not allow for your provider to perform a traditional examination. This may limit your provider's ability to fully assess your condition. If your provider identifies any concerns that need to be evaluated in person or the need to arrange testing (such as labs, EKG, etc.), we will make arrangements to do so. Although advances in technology are sophisticated, we cannot ensure that it will always work on either your end or our end. If the connection with a video visit is poor, the visit may have to be switched to a telephone visit. With either a video or telephone visit, we are not always able to ensure that we have a secure connection.  By engaging in this virtual visit, you consent to the provision of healthcare and authorize for your insurance to be billed (if applicable) for the services provided during this visit. Depending on your insurance coverage, you may receive a charge related to this service.  I need to obtain your verbal consent now. Are you willing to proceed with your visit today? Robin Schmidt has provided verbal consent on 03/04/2024 for a virtual visit (video or telephone). Robin Schmidt, [place]  Date: 03/04/2024 12:57 PM   Virtual Visit via Video Note   I, Robin Schmidt, connected with  Robin Schmidt  ([id number], Apr 09, 1966) on 03/04/24 at 12:45 PM EDT by a video-enabled telemedicine application and verified that I am speaking with the correct person using two identifiers.  Location: Patient: Virtual Visit Location Patient:  Home Provider: Virtual Visit Location Provider: Home Office   I discussed the limitations of evaluation and management by telemedicine and the availability of in person appointments. The patient expressed understanding and agreed to proceed.    History of Present Illness: Robin Schmidt is a 58 y.o. who identifies as a female who was assigned female at birth, and is being seen today for back pain.  HPI: Back Pain This is a new problem. The current episode started in the past 7 days. The problem has been gradually worsening since onset. The quality of the pain is described as aching. Associated symptoms include abdominal pain. She has tried nothing for the symptoms.    Problems:  Patient Active Problem List   Diagnosis Date Noted   Obesity (BMI 30-39.9) 11/17/2023   Full thickness rotator cuff tear 11/19/2022   Tinnitus, right 07/08/2017   Carpal tunnel syndrome 07/17/2014   Obstructive sleep apnea 07/17/2014   Radiculopathy 11/22/2013   Menometrorrhagia 06/08/2012    Class: Chronic   Chronic lower back pain 04/01/2007    Allergies: No Known Allergies Medications:  Current Outpatient Medications:    ascorbic acid (VITAMIN C) 1000 MG tablet, Take by mouth., Disp: , Rfl:    calcium citrate-vitamin D  (CITRACAL+D) 315-200 MG-UNIT tablet, Take by mouth., Disp: , Rfl:    celecoxib (CELEBREX) 200 MG capsule, TAKE 1 CAPSULE BY MOUTH EVERY DAY AS NEEDED, Disp: , Rfl:    cyclobenzaprine (FLEXERIL) 10 MG tablet, Take 10 mg by mouth 3 (three) times  daily as needed for muscle spasms., Disp: , Rfl:    diclofenac  Sodium (VOLTAREN ) 1 % GEL, Apply a small amount to areas of concern twice daily, Disp: 150 g, Rfl: 2   DULoxetine  (CYMBALTA ) 30 MG capsule, Take 30 mg by mouth daily., Disp: , Rfl:    ergocalciferol  (VITAMIN D2) 1.25 MG (50000 UT) capsule, Take 50,000 Units by mouth daily in the afternoon., Disp: , Rfl:    fluticasone  (FLONASE ) 50 MCG/ACT nasal spray, Place 2 sprays into both nostrils  daily., Disp: 16 g, Rfl: 0   gabapentin  (NEURONTIN ) 300 MG capsule, Take 300 mg by mouth 2 (two) times daily.  , Disp: , Rfl:    hydrOXYzine (ATARAX) 50 MG tablet, Take 50 mg by mouth 2 (two) times daily as needed for anxiety., Disp: , Rfl:    loratadine  (CLARITIN ) 10 MG tablet, Take 1 tablet (10 mg total) by mouth daily., Disp: 90 tablet, Rfl: 0   methocarbamol (ROBAXIN) 500 MG tablet, Take 1 tablet every 6-8 hours by oral route as needed for spasm., Disp: , Rfl:    methylPREDNISolone  (MEDROL  DOSEPAK) 4 MG TBPK tablet, See box instructions, Disp: 21 each, Rfl: 0   montelukast  (SINGULAIR ) 10 MG tablet, Take 1 tablet (10 mg total) by mouth at bedtime., Disp: 30 tablet, Rfl: 0   Multiple Vitamins-Minerals (MULTIVITAMIN WITH MINERALS) tablet, Take by mouth., Disp: , Rfl:    nitrofurantoin , macrocrystal-monohydrate, (MACROBID ) 100 MG capsule, Take 1 capsule (100 mg total) by mouth 2 (two) times daily., Disp: 10 capsule, Rfl: 0   Omega-3 1000 MG CAPS, Take by mouth., Disp: , Rfl:    oxyCODONE  (ROXICODONE ) 5 MG immediate release tablet, Take 1 tablet (5 mg total) by mouth every 6 (six) hours as needed for severe pain (pain score 7-10)., Disp: 10 tablet, Rfl: 0   phentermine  37.5 MG capsule, Take 1 capsule (37.5 mg total) by mouth every morning., Disp: 30 capsule, Rfl: 3  Observations/Objective: Patient is well-developed, well-nourished in no acute distress.  Resting comfortably  at home.  Head is normocephalic, atraumatic.  No labored breathing.  Speech is clear and coherent with logical content.  Patient is alert and oriented at baseline.    Assessment and Plan: 1. Abdominal pain, unspecified abdominal location (Primary)  2. Back pain, unspecified back location, unspecified back pain laterality, unspecified chronicity  Patient with concern regarding gall stone with abdominal/back pain. Recommended in person evaluation. Patient to report to nearest ER.   Follow Up Instructions: I discussed  the assessment and treatment plan with the patient. The patient was provided an opportunity to ask questions and all were answered. The patient agreed with the plan and demonstrated an understanding of the instructions.  A copy of instructions were sent to the patient via MyChart unless otherwise noted below.     The patient was advised to call back or seek an in-person evaluation if the symptoms worsen or if the condition fails to improve as anticipated.    Robin Settle, PA-C

## 2024-03-04 NOTE — Patient Instructions (Signed)
 Maxie W Senna, thank you for joining [name], PA-C for today's virtual visit.  While this provider is not your primary care provider (PCP), if your PCP is located in our provider database this encounter information will be shared with them immediately following your visit.   A [facility] MyChart account gives you access to today's visit and all your visits, tests, and labs performed at [facility] " click here if you don't have a [facility] MyChart account or go to [email or web address]  Consent: (Patient) Robin Schmidt provided verbal consent for this virtual visit at the beginning of the encounter.  Current Medications:  Current Outpatient Medications:    ascorbic acid (VITAMIN C) 1000 MG tablet, Take by mouth., Disp: , Rfl:    calcium citrate-vitamin D  (CITRACAL+D) 315-200 MG-UNIT tablet, Take by mouth., Disp: , Rfl:    celecoxib (CELEBREX) 200 MG capsule, TAKE 1 CAPSULE BY MOUTH EVERY DAY AS NEEDED, Disp: , Rfl:    cyclobenzaprine (FLEXERIL) 10 MG tablet, Take 10 mg by mouth 3 (three) times daily as needed for muscle spasms., Disp: , Rfl:    diclofenac  Sodium (VOLTAREN ) 1 % GEL, Apply a small amount to areas of concern twice daily, Disp: 150 g, Rfl: 2   DULoxetine  (CYMBALTA ) 30 MG capsule, Take 30 mg by mouth daily., Disp: , Rfl:    ergocalciferol  (VITAMIN D2) 1.25 MG (50000 UT) capsule, Take 50,000 Units by mouth daily in the afternoon., Disp: , Rfl:    fluticasone  (FLONASE ) 50 MCG/ACT nasal spray, Place 2 sprays into both nostrils daily., Disp: 16 g, Rfl: 0   gabapentin  (NEURONTIN ) 300 MG capsule, Take 300 mg by mouth 2 (two) times daily.  , Disp: , Rfl:    hydrOXYzine (ATARAX) 50 MG tablet, Take 50 mg by mouth 2 (two) times daily as needed for anxiety., Disp: , Rfl:    loratadine  (CLARITIN ) 10 MG tablet, Take 1 tablet (10 mg total) by mouth daily., Disp: 90 tablet, Rfl: 0   methocarbamol (ROBAXIN) 500 MG tablet, Take 1 tablet every 6-8 hours by oral route as  needed for spasm., Disp: , Rfl:    methylPREDNISolone  (MEDROL  DOSEPAK) 4 MG TBPK tablet, See box instructions, Disp: 21 each, Rfl: 0   montelukast  (SINGULAIR ) 10 MG tablet, Take 1 tablet (10 mg total) by mouth at bedtime., Disp: 30 tablet, Rfl: 0   Multiple Vitamins-Minerals (MULTIVITAMIN WITH MINERALS) tablet, Take by mouth., Disp: , Rfl:    nitrofurantoin , macrocrystal-monohydrate, (MACROBID ) 100 MG capsule, Take 1 capsule (100 mg total) by mouth 2 (two) times daily., Disp: 10 capsule, Rfl: 0   Omega-3 1000 MG CAPS, Take by mouth., Disp: , Rfl:    oxyCODONE  (ROXICODONE ) 5 MG immediate release tablet, Take 1 tablet (5 mg total) by mouth every 6 (six) hours as needed for severe pain (pain score 7-10)., Disp: 10 tablet, Rfl: 0   phentermine  37.5 MG capsule, Take 1 capsule (37.5 mg total) by mouth every morning., Disp: 30 capsule, Rfl: 3   Medications ordered in this encounter:  No orders of the defined types were placed in this encounter.    *If you need refills on other medications prior to your next appointment, please contact your pharmacy*  Follow-Up: Call back or seek an in-person evaluation if the symptoms worsen or if the condition fails to improve as anticipated.  [facility] [phone]  Other Instructions Report to ER for evaluation.    If you have been instructed to have an in-person evaluation  today at a local Urgent Care facility, please use the link below. It will take you to a list of all of our available [facility], including address, phone number and hours of operation. Please do not delay care.  [facility]  If you or a family member do not have a primary care provider, use the link below to schedule a visit and establish care. When you choose a [facility] primary care physician or advanced practice provider, you gain a long-term partner in health. Find a Primary Care Provider  Learn more about [facility]'s in-office and  virtual care options: [facility] - Get Care Now

## 2024-03-06 DIAGNOSIS — M5030 Other cervical disc degeneration, unspecified cervical region: Secondary | ICD-10-CM | POA: Diagnosis not present

## 2024-03-06 DIAGNOSIS — M25531 Pain in right wrist: Secondary | ICD-10-CM | POA: Diagnosis not present

## 2024-03-06 DIAGNOSIS — M75121 Complete rotator cuff tear or rupture of right shoulder, not specified as traumatic: Secondary | ICD-10-CM | POA: Diagnosis not present

## 2024-03-06 DIAGNOSIS — G5601 Carpal tunnel syndrome, right upper limb: Secondary | ICD-10-CM | POA: Diagnosis not present

## 2024-03-06 DIAGNOSIS — R200 Anesthesia of skin: Secondary | ICD-10-CM | POA: Diagnosis not present

## 2024-03-08 ENCOUNTER — Encounter: Payer: Self-pay | Admitting: Physician Assistant

## 2024-03-08 ENCOUNTER — Ambulatory Visit (INDEPENDENT_AMBULATORY_CARE_PROVIDER_SITE_OTHER): Admitting: Physician Assistant

## 2024-03-08 VITALS — BP 111/71 | HR 73 | Ht 66.0 in | Wt 194.2 lb

## 2024-03-08 DIAGNOSIS — Z6831 Body mass index (BMI) 31.0-31.9, adult: Secondary | ICD-10-CM | POA: Diagnosis not present

## 2024-03-08 DIAGNOSIS — K5900 Constipation, unspecified: Secondary | ICD-10-CM | POA: Diagnosis not present

## 2024-03-08 DIAGNOSIS — E669 Obesity, unspecified: Secondary | ICD-10-CM

## 2024-03-08 NOTE — Patient Instructions (Signed)
 Metamucil or benefiber supplements

## 2024-03-08 NOTE — Progress Notes (Signed)
 Established patient visit   Patient: Robin Schmidt   DOB: February 23, 1966   58 y.o. Female  MRN: [id number] Visit Date: 03/08/2024  Today's healthcare provider: Trenton Frock, PA-C   Cc. ED follow up  Subjective     Pt presents today for ED f/u , she was seen 4/20 for neck pain with radiation to her right shoulder. She was given a medrol  dose pack and oxycodone .  Discussed the use of AI scribe software for clinical note transcription with the patient, who gave verbal consent to proceed.  History of Present Illness   Robin Schmidt is a 58 year old female who presents with concerns about weight gain and pain management.  She has gained approximately ten pounds since the summer, attributed to decreased physical activity due to back, nerve, and shoulder pain. She takes phentermine  for weight management but finds it ineffective, with side effects including excess energy,   Chronic pain involves her back, shoulder, and neck, with nerve pain radiating to her leg, especially during stretching or sleeping. Spasms last seven to nine minutes, causing significant discomfort. She is undergoing physical therapy for her shoulder.   She also suspects that she passed a gallstone in her stool, that it was small and hard. Overall is constipated.  Medications: Outpatient Medications Prior to Visit  Medication Sig   ascorbic acid (VITAMIN C) 1000 MG tablet Take by mouth.   calcium citrate-vitamin D  (CITRACAL+D) 315-200 MG-UNIT tablet Take by mouth.   celecoxib (CELEBREX) 200 MG capsule TAKE 1 CAPSULE BY MOUTH EVERY DAY AS NEEDED   cyclobenzaprine (FLEXERIL) 10 MG tablet Take 10 mg by mouth 3 (three) times daily as needed for muscle spasms.   diclofenac  Sodium (VOLTAREN ) 1 % GEL Apply a small amount to areas of concern twice daily   DULoxetine  (CYMBALTA ) 30 MG capsule Take 30 mg by mouth daily.   ergocalciferol  (VITAMIN D2) 1.25 MG (50000 UT) capsule Take 50,000 Units by mouth daily in the  afternoon.   fluticasone  (FLONASE ) 50 MCG/ACT nasal spray Place 2 sprays into both nostrils daily.   gabapentin  (NEURONTIN ) 300 MG capsule Take 300 mg by mouth 2 (two) times daily.     hydrOXYzine (ATARAX) 50 MG tablet Take 50 mg by mouth 2 (two) times daily as needed for anxiety.   loratadine  (CLARITIN ) 10 MG tablet Take 1 tablet (10 mg total) by mouth daily.   methocarbamol (ROBAXIN) 500 MG tablet Take 1 tablet every 6-8 hours by oral route as needed for spasm.   methylPREDNISolone  (MEDROL  DOSEPAK) 4 MG TBPK tablet See box instructions   montelukast  (SINGULAIR ) 10 MG tablet Take 1 tablet (10 mg total) by mouth at bedtime.   Multiple Vitamins-Minerals (MULTIVITAMIN WITH MINERALS) tablet Take by mouth.   nitrofurantoin , macrocrystal-monohydrate, (MACROBID ) 100 MG capsule Take 1 capsule (100 mg total) by mouth 2 (two) times daily.   Omega-3 1000 MG CAPS Take by mouth.   oxyCODONE  (ROXICODONE ) 5 MG immediate release tablet Take 1 tablet (5 mg total) by mouth every 6 (six) hours as needed for severe pain (pain score 7-10).   [DISCONTINUED] phentermine  37.5 MG capsule Take 1 capsule (37.5 mg total) by mouth every morning.   No facility-administered medications prior to visit.    Review of Systems  Constitutional:  Negative for fatigue and fever.  Respiratory:  Negative for cough and shortness of breath.   Cardiovascular:  Negative for chest pain and leg swelling.  Gastrointestinal:  Positive for constipation. Negative for abdominal  pain.  Musculoskeletal:  Positive for arthralgias and myalgias.  Neurological:  Negative for dizziness and headaches.       Objective    BP 111/71   Pulse 73   Ht 5\' 6"  (1.676 m)   Wt 194 lb 3.2 oz (88.1 kg)   LMP 03/12/2016   BMI 31.34 kg/m    Physical Exam Vitals reviewed.  Constitutional:      Appearance: She is not ill-appearing.  HENT:     Head: Normocephalic.  Eyes:     Conjunctiva/sclera: Conjunctivae normal.  Cardiovascular:     Rate and  Rhythm: Normal rate.  Pulmonary:     Effort: Pulmonary effort is normal. No respiratory distress.  Neurological:     Mental Status: She is alert and oriented to person, place, and time.  Psychiatric:        Mood and Affect: Mood normal.        Behavior: Behavior normal.      No results found for any visits on 03/08/24.  Assessment & Plan    Constipation, unspecified constipation type  Obesity (BMI 30-39.9)   Weight gain Weight gain of approximately 10 pounds since summer due to decreased activity from back and shoulder pain. Phentermine  not well tolerated due to insomnia and excessive energy. Discussed phentermine  is not a long-term weight loss solution, and is ideally the most effective when combined with lifestyle changes - Encourage lifestyle modifications including increased physical activity and dietary changes  Constipation -- recommend staying hydrated and adding in a fiber supplement, metamucil or benefiber. Advised it is not possible to pass gallstones. Likely a firm hard stool.  Return if symptoms worsen or fail to improve.       Trenton Frock, PA-C  [facility] at [facility] [phone] (phone) [phone] (fax)  [facility]

## 2024-03-22 ENCOUNTER — Ambulatory Visit: Payer: Self-pay

## 2024-03-27 DIAGNOSIS — M5441 Lumbago with sciatica, right side: Secondary | ICD-10-CM | POA: Diagnosis not present

## 2024-03-27 DIAGNOSIS — M25511 Pain in right shoulder: Secondary | ICD-10-CM | POA: Diagnosis not present

## 2024-03-27 DIAGNOSIS — G5603 Carpal tunnel syndrome, bilateral upper limbs: Secondary | ICD-10-CM | POA: Diagnosis not present

## 2024-03-27 DIAGNOSIS — M5432 Sciatica, left side: Secondary | ICD-10-CM | POA: Diagnosis not present

## 2024-03-27 DIAGNOSIS — G894 Chronic pain syndrome: Secondary | ICD-10-CM | POA: Diagnosis not present

## 2024-05-02 ENCOUNTER — Telehealth: Admitting: Physician Assistant

## 2024-05-02 DIAGNOSIS — R058 Other specified cough: Secondary | ICD-10-CM

## 2024-05-02 DIAGNOSIS — J22 Unspecified acute lower respiratory infection: Secondary | ICD-10-CM

## 2024-05-02 MED ORDER — BENZONATATE 200 MG PO CAPS: 200.0000 mg | ORAL_CAPSULE | Freq: Three times a day (TID) | ORAL | 0 refills | Status: DC | PRN

## 2024-05-02 MED ORDER — AZITHROMYCIN 250 MG PO TABS: ORAL_TABLET | ORAL | 0 refills | Status: DC

## 2024-05-02 NOTE — Patient Instructions (Signed)
 Earnstine W Boer, thank you for joining [name], PA-C for today's virtual visit.  While this provider is not your primary care provider (PCP), if your PCP is located in our provider database this encounter information will be shared with them immediately following your visit.   A [facility] MyChart account gives you access to today's visit and all your visits, tests, and labs performed at [facility]  click here if you don't have a [facility] MyChart account or go to [email or web address]  Consent: (Patient) Robin Schmidt provided verbal consent for this virtual visit at the beginning of the encounter.  Current Medications:  Current Outpatient Medications:    azithromycin (ZITHROMAX) 250 MG tablet, Take 2 tablets on day 1, then 1 tablet daily on days 2 through 5, Disp: 6 tablet, Rfl: 0   benzonatate (TESSALON) 200 MG capsule, Take 1 capsule (200 mg total) by mouth 3 (three) times daily as needed for cough., Disp: 30 capsule, Rfl: 0   ascorbic acid (VITAMIN C) 1000 MG tablet, Take by mouth., Disp: , Rfl:    calcium citrate-vitamin D  (CITRACAL+D) 315-200 MG-UNIT tablet, Take by mouth., Disp: , Rfl:    celecoxib (CELEBREX) 200 MG capsule, TAKE 1 CAPSULE BY MOUTH EVERY DAY AS NEEDED, Disp: , Rfl:    cyclobenzaprine (FLEXERIL) 10 MG tablet, Take 10 mg by mouth 3 (three) times daily as needed for muscle spasms., Disp: , Rfl:    diclofenac  Sodium (VOLTAREN ) 1 % GEL, Apply a small amount to areas of concern twice daily, Disp: 150 g, Rfl: 2   DULoxetine  (CYMBALTA ) 30 MG capsule, Take 30 mg by mouth daily., Disp: , Rfl:    ergocalciferol  (VITAMIN D2) 1.25 MG (50000 UT) capsule, Take 50,000 Units by mouth daily in the afternoon., Disp: , Rfl:    fluticasone  (FLONASE ) 50 MCG/ACT nasal spray, Place 2 sprays into both nostrils daily., Disp: 16 g, Rfl: 0   gabapentin  (NEURONTIN ) 300 MG capsule, Take 300 mg by mouth 2 (two) times daily.  , Disp: , Rfl:    hydrOXYzine (ATARAX) 50 MG  tablet, Take 50 mg by mouth 2 (two) times daily as needed for anxiety., Disp: , Rfl:    loratadine  (CLARITIN ) 10 MG tablet, Take 1 tablet (10 mg total) by mouth daily., Disp: 90 tablet, Rfl: 0   methocarbamol (ROBAXIN) 500 MG tablet, Take 1 tablet every 6-8 hours by oral route as needed for spasm., Disp: , Rfl:    methylPREDNISolone  (MEDROL  DOSEPAK) 4 MG TBPK tablet, See box instructions, Disp: 21 each, Rfl: 0   montelukast  (SINGULAIR ) 10 MG tablet, Take 1 tablet (10 mg total) by mouth at bedtime., Disp: 30 tablet, Rfl: 0   Multiple Vitamins-Minerals (MULTIVITAMIN WITH MINERALS) tablet, Take by mouth., Disp: , Rfl:    nitrofurantoin , macrocrystal-monohydrate, (MACROBID ) 100 MG capsule, Take 1 capsule (100 mg total) by mouth 2 (two) times daily., Disp: 10 capsule, Rfl: 0   Omega-3 1000 MG CAPS, Take by mouth., Disp: , Rfl:    oxyCODONE  (ROXICODONE ) 5 MG immediate release tablet, Take 1 tablet (5 mg total) by mouth every 6 (six) hours as needed for severe pain (pain score 7-10)., Disp: 10 tablet, Rfl: 0   Medications ordered in this encounter:  Meds ordered this encounter  Medications   azithromycin (ZITHROMAX) 250 MG tablet    Sig: Take 2 tablets on day 1, then 1 tablet daily on days 2 through 5    Dispense:  6 tablet    Refill:  0  Supervising Provider:   LAMPTEY, PHILIP O [[id number]]   benzonatate (TESSALON) 200 MG capsule    Sig: Take 1 capsule (200 mg total) by mouth 3 (three) times daily as needed for cough.    Dispense:  30 capsule    Refill:  0    Supervising Provider:   BLAISE ALEENE KIDD [[id number]]     *If you need refills on other medications prior to your next appointment, please contact your pharmacy*  Follow-Up: Call back or seek an in-person evaluation if the symptoms worsen or if the condition fails to improve as anticipated.  [facility] [phone]  Other Instructions Increase fluids Start new medicines as prescribed. Use inhaler as previously  prescribed for coughing fits. Continue to watch for worsening symptoms. Schedule a virtual appointment or follow up at an urgent care clinic if symptoms don't improve.    If you have been instructed to have an in-person evaluation today at a local Urgent Care facility, please use the link below. It will take you to a list of all of our available [facility], including address, phone number and hours of operation. Please do not delay care.  [facility]  If you or a family member do not have a primary care provider, use the link below to schedule a visit and establish care. When you choose a [facility] primary care physician or advanced practice provider, you gain a long-term partner in health. Find a Primary Care Provider  Learn more about [facility] in-office and virtual care options: [facility] - Get Care Now

## 2024-05-02 NOTE — Progress Notes (Signed)
 Virtual Visit Consent   Robin Schmidt, you are scheduled for a virtual visit with a Robin Schmidt provider today. Just as with appointments in the office, your consent must be obtained to participate. Your consent will be active for this visit and any virtual visit you may have with one of our providers in the next 365 days. If you have a MyChart account, a copy of this consent can be sent to you electronically.  As this is a virtual visit, video technology does not allow for your provider to perform a traditional examination. This may limit your provider's ability to fully assess your condition. If your provider identifies any concerns that need to be evaluated in person or the need to arrange testing (such as labs, EKG, etc.), we will make arrangements to do so. Although advances in technology are sophisticated, we cannot ensure that it will always work on either your end or our end. If the connection with a video visit is poor, the visit may have to be switched to a telephone visit. With either a video or telephone visit, we are not always able to ensure that we have a secure connection.  By engaging in this virtual visit, you consent to the provision of healthcare and authorize for your insurance to be billed (if applicable) for the services provided during this visit. Depending on your insurance coverage, you may receive a charge related to this service.  I need to obtain your verbal consent now. Are you willing to proceed with your visit today? Robin Schmidt has provided verbal consent on 05/02/2024 for a virtual visit (video or telephone). Robin Schmidt, [place]  Date: 05/02/2024 5:33 PM   Virtual Visit via Video Note   I, Robin Schmidt, connected with  Robin Schmidt  ([id number], 28-Mar-1966) on 05/02/24 at  5:30 PM EDT by a video-enabled telemedicine application and verified that I am speaking with the correct person using two identifiers.  Location: Patient: Virtual Visit Location Patient:  Home Provider: Virtual Visit Location Provider: Home Office   I discussed the limitations of evaluation and management by telemedicine and the availability of in person appointments. The patient expressed understanding and agreed to proceed.    History of Present Illness: Robin Schmidt is a 58 y.o. who identifies as a female who was assigned female at birth, and is being seen today for cough.  HPI: 58y/o F presents for a telehealth video visit for c/o chest congestion and cough x 2 weeks. She has been taking Sudafed without any relief. She denies history of smoking. She denies h/o asthma. She does on occasion use Albuterol inhaler with high pollen because her breathing gets harder. Denies fever. Denies CP or SOB.   Cough    Problems:  Patient Active Problem List   Diagnosis Date Noted   Obesity (BMI 30-39.9) 11/17/2023   Full thickness rotator cuff tear 11/19/2022   Tinnitus, right 07/08/2017   Carpal tunnel syndrome 07/17/2014   Obstructive sleep apnea 07/17/2014   Radiculopathy 11/22/2013   Menometrorrhagia 06/08/2012    Class: Chronic   Chronic lower back pain 04/01/2007    Allergies: No Known Allergies Medications:  Current Outpatient Medications:    azithromycin (ZITHROMAX) 250 MG tablet, Take 2 tablets on day 1, then 1 tablet daily on days 2 through 5, Disp: 6 tablet, Rfl: 0   benzonatate (TESSALON) 200 MG capsule, Take 1 capsule (200 mg total) by mouth 3 (three) times daily as needed for cough., Disp: 30 capsule, Rfl:  0   ascorbic acid (VITAMIN C) 1000 MG tablet, Take by mouth., Disp: , Rfl:    calcium citrate-vitamin D  (CITRACAL+D) 315-200 MG-UNIT tablet, Take by mouth., Disp: , Rfl:    celecoxib (CELEBREX) 200 MG capsule, TAKE 1 CAPSULE BY MOUTH EVERY DAY AS NEEDED, Disp: , Rfl:    cyclobenzaprine (FLEXERIL) 10 MG tablet, Take 10 mg by mouth 3 (three) times daily as needed for muscle spasms., Disp: , Rfl:    diclofenac  Sodium (VOLTAREN ) 1 % GEL, Apply a small amount to  areas of concern twice daily, Disp: 150 g, Rfl: 2   DULoxetine  (CYMBALTA ) 30 MG capsule, Take 30 mg by mouth daily., Disp: , Rfl:    ergocalciferol  (VITAMIN D2) 1.25 MG (50000 UT) capsule, Take 50,000 Units by mouth daily in the afternoon., Disp: , Rfl:    fluticasone  (FLONASE ) 50 MCG/ACT nasal spray, Place 2 sprays into both nostrils daily., Disp: 16 g, Rfl: 0   gabapentin  (NEURONTIN ) 300 MG capsule, Take 300 mg by mouth 2 (two) times daily.  , Disp: , Rfl:    hydrOXYzine (ATARAX) 50 MG tablet, Take 50 mg by mouth 2 (two) times daily as needed for anxiety., Disp: , Rfl:    loratadine  (CLARITIN ) 10 MG tablet, Take 1 tablet (10 mg total) by mouth daily., Disp: 90 tablet, Rfl: 0   methocarbamol (ROBAXIN) 500 MG tablet, Take 1 tablet every 6-8 hours by oral route as needed for spasm., Disp: , Rfl:    methylPREDNISolone  (MEDROL  DOSEPAK) 4 MG TBPK tablet, See box instructions, Disp: 21 each, Rfl: 0   montelukast  (SINGULAIR ) 10 MG tablet, Take 1 tablet (10 mg total) by mouth at bedtime., Disp: 30 tablet, Rfl: 0   Multiple Vitamins-Minerals (MULTIVITAMIN WITH MINERALS) tablet, Take by mouth., Disp: , Rfl:    nitrofurantoin , macrocrystal-monohydrate, (MACROBID ) 100 MG capsule, Take 1 capsule (100 mg total) by mouth 2 (two) times daily., Disp: 10 capsule, Rfl: 0   Omega-3 1000 MG CAPS, Take by mouth., Disp: , Rfl:    oxyCODONE  (ROXICODONE ) 5 MG immediate release tablet, Take 1 tablet (5 mg total) by mouth every 6 (six) hours as needed for severe pain (pain score 7-10)., Disp: 10 tablet, Rfl: 0  Observations/Objective: Patient is well-developed, well-nourished in no acute distress.  Resting comfortably  at home.  Head is normocephalic, atraumatic.  No labored breathing.  Speech is clear and coherent with logical content.  Patient is alert and oriented at baseline.    Assessment and Plan: 1. Lower respiratory infection (Primary) - azithromycin (ZITHROMAX) 250 MG tablet; Take 2 tablets on day 1, then 1  tablet daily on days 2 through 5  Dispense: 6 tablet; Refill: 0  2. Cough productive of clear sputum - benzonatate (TESSALON) 200 MG capsule; Take 1 capsule (200 mg total) by mouth 3 (three) times daily as needed for cough.  Dispense: 30 capsule; Refill: 0  Increase fluids Start new medicines as prescribed. Use inhaler as previously prescribed for coughing fits. Continue to watch for worsening symptoms. Schedule a virtual appointment or follow up at an urgent care clinic if symptoms don't improve.  Pt verbalized understanding and in agreement.    Follow Up Instructions: I discussed the assessment and treatment plan with the patient. The patient was provided an opportunity to ask questions and all were answered. The patient agreed with the plan and demonstrated an understanding of the instructions.  A copy of instructions were sent to the patient via MyChart unless otherwise noted below.  Patient has requested to receive PHI (AVS, Work Notes, etc) pertaining to this video visit through e-mail as they are currently without active MyChart. They have voiced understand that email is not considered secure and their health information could be viewed by someone other than the patient.   The patient was advised to call back or seek an in-person evaluation if the symptoms worsen or if the condition fails to improve as anticipated.    Daylin Gruszka, PA-C

## 2024-05-03 DIAGNOSIS — F411 Generalized anxiety disorder: Secondary | ICD-10-CM | POA: Diagnosis not present

## 2024-05-05 ENCOUNTER — Encounter (HOSPITAL_BASED_OUTPATIENT_CLINIC_OR_DEPARTMENT_OTHER): Payer: Self-pay

## 2024-05-05 ENCOUNTER — Other Ambulatory Visit: Payer: Self-pay

## 2024-05-05 DIAGNOSIS — J40 Bronchitis, not specified as acute or chronic: Secondary | ICD-10-CM | POA: Diagnosis not present

## 2024-05-05 DIAGNOSIS — R42 Dizziness and giddiness: Secondary | ICD-10-CM | POA: Diagnosis not present

## 2024-05-05 DIAGNOSIS — R059 Cough, unspecified: Secondary | ICD-10-CM | POA: Diagnosis present

## 2024-05-05 NOTE — ED Triage Notes (Signed)
 Pt reports feeling like room is spinning starting about 1.5hrs ago with ringing in L ear with associated nausea. Pt reports she was resting when symptoms starting.

## 2024-05-05 NOTE — ED Triage Notes (Signed)
 Pt reports battling a URI recently and was sent amoxicillin  and cough medicine that she's been on for 2 days.

## 2024-05-06 ENCOUNTER — Emergency Department (HOSPITAL_BASED_OUTPATIENT_CLINIC_OR_DEPARTMENT_OTHER)
Admission: EM | Admit: 2024-05-06 | Discharge: 2024-05-06 | Disposition: A | Discharge status: Home / Self Care | Attending: Emergency Medicine | Admitting: Emergency Medicine

## 2024-05-06 DIAGNOSIS — J40 Bronchitis, not specified as acute or chronic: Secondary | ICD-10-CM

## 2024-05-06 DIAGNOSIS — R42 Dizziness and giddiness: Secondary | ICD-10-CM

## 2024-05-06 LAB — CBC WITH DIFFERENTIAL/PLATELET
Abs Immature Granulocytes: 0.01 10*3/uL (ref 0.00–0.07)
Basophils Absolute: 0 10*3/uL (ref 0.0–0.1)
Basophils Relative: 1 %
Eosinophils Absolute: 0.1 10*3/uL (ref 0.0–0.5)
Eosinophils Relative: 2 %
HCT: 40 % (ref 36.0–46.0)
Hemoglobin: 13 g/dL (ref 12.0–15.0)
Immature Granulocytes: 0 %
Lymphocytes Relative: 39 %
Lymphs Abs: 1.9 10*3/uL (ref 0.7–4.0)
MCH: 27.1 pg (ref 26.0–34.0)
MCHC: 32.5 g/dL (ref 30.0–36.0)
MCV: 83.3 fL (ref 80.0–100.0)
Monocytes Absolute: 0.4 10*3/uL (ref 0.1–1.0)
Monocytes Relative: 8 %
Neutro Abs: 2.4 10*3/uL (ref 1.7–7.7)
Neutrophils Relative %: 50 %
Platelets: 244 10*3/uL (ref 150–400)
RBC: 4.8 MIL/uL (ref 3.87–5.11)
RDW: 14.2 % (ref 11.5–15.5)
WBC: 4.8 10*3/uL (ref 4.0–10.5)
nRBC: 0 % (ref 0.0–0.2)

## 2024-05-06 LAB — BASIC METABOLIC PANEL WITH GFR
Anion gap: 13 (ref 5–15)
BUN: 18 mg/dL (ref 6–20)
CO2: 23 mmol/L (ref 22–32)
Calcium: 9.3 mg/dL (ref 8.9–10.3)
Chloride: 104 mmol/L (ref 98–111)
Creatinine, Ser: 1.04 mg/dL — ABNORMAL HIGH (ref 0.44–1.00)
GFR, Estimated: >60 mL/min (ref >60)
Glucose, Bld: 89 mg/dL (ref 70–99)
Potassium: 4 mmol/L (ref 3.5–5.1)
Sodium: 141 mmol/L (ref 135–145)

## 2024-05-06 MED ORDER — PREDNISONE 10 MG (21) PO TBPK: ORAL_TABLET | ORAL | 0 refills | Status: DC

## 2024-05-06 MED ORDER — MECLIZINE HCL 25 MG PO TABS: 25.0000 mg | ORAL_TABLET | Freq: Three times a day (TID) | ORAL | 0 refills | Status: DC | PRN

## 2024-05-06 MED ORDER — ONDANSETRON 4 MG PO TBDP: 4.0000 mg | ORAL_TABLET | Freq: Three times a day (TID) | ORAL | 0 refills | Status: AC | PRN

## 2024-05-06 MED ORDER — LACTATED RINGERS IV BOLUS
1000.0000 mL | Freq: Once | INTRAVENOUS | Status: AC
  Administered 2024-05-06: 1000 mL via INTRAVENOUS

## 2024-05-06 MED ORDER — MECLIZINE HCL 25 MG PO TABS
25.0000 mg | ORAL_TABLET | Freq: Once | ORAL | Status: AC
  Administered 2024-05-06: 25 mg via ORAL
  Filled 2024-05-06: qty 1

## 2024-05-06 MED ORDER — ONDANSETRON HCL 4 MG/2ML IJ SOLN
4.0000 mg | Freq: Once | INTRAMUSCULAR | Status: AC
  Administered 2024-05-06: 4 mg via INTRAVENOUS
  Filled 2024-05-06: qty 2

## 2024-05-06 NOTE — ED Notes (Signed)
 Pt out of ED via WC with sig other not in visible distress.

## 2024-05-06 NOTE — ED Provider Notes (Signed)
 [place] EMERGENCY DEPARTMENT AT [facility]  Provider Note  CSN: [id number] Arrival date & time: 05/05/24 2337  History Chief Complaint  Patient presents with   Dizziness    Robin Schmidt is a 58 y.o. female with no significant PMH reports she has had a cough and congestion for about 2 weeks. She did a virtual visit 4 days ago and given Rx for zithromax  and tessalon  which has not helped much. Still having cough, some nausea, no fevers. Today around 10pm she was walking to her car when she began having ringing in her L ear and spinning dizziness. Symptoms have since resolved but she reports feeling some nausea and like she is dehydrated.   Home Medications Prior to Admission medications   Medication Sig Start Date End Date Taking? Authorizing Provider  meclizine (ANTIVERT) 25 MG tablet Take 1 tablet (25 mg total) by mouth 3 (three) times daily as needed for dizziness. 05/06/24  Yes Roselyn Carlin NOVAK, MD  ondansetron  (ZOFRAN -ODT) 4 MG disintegrating tablet Take 1 tablet (4 mg total) by mouth every 8 (eight) hours as needed for nausea or vomiting. 05/06/24  Yes Roselyn Carlin NOVAK, MD  predniSONE (STERAPRED UNI-PAK 21 TAB) 10 MG (21) TBPK tablet 10mg  Tabs, 6 day taper. Use as directed 05/06/24  Yes Roselyn Carlin NOVAK, MD  ascorbic acid (VITAMIN C) 1000 MG tablet Take by mouth.    [provider]  azithromycin  (ZITHROMAX ) 250 MG tablet Take 2 tablets on day 1, then 1 tablet daily on days 2 through 5 05/02/24   Gandhi, Safal, PA-C  benzonatate  (TESSALON ) 200 MG capsule Take 1 capsule (200 mg total) by mouth 3 (three) times daily as needed for cough. 05/02/24   Gandhi, Safal, PA-C  calcium citrate-vitamin D  (CITRACAL+D) 315-200 MG-UNIT tablet Take by mouth.    [provider]  celecoxib (CELEBREX) 200 MG capsule TAKE 1 CAPSULE BY MOUTH EVERY DAY AS NEEDED 11/11/22   [provider]  cyclobenzaprine (FLEXERIL) 10 MG tablet Take 10 mg by mouth 3 (three) times  daily as needed for muscle spasms.    [provider]  diclofenac  Sodium (VOLTAREN ) 1 % GEL Apply a small amount to areas of concern twice daily 11/17/23   Drubel, Manuelita, PA-C  DULoxetine  (CYMBALTA ) 30 MG capsule Take 30 mg by mouth daily.    [provider]  ergocalciferol  (VITAMIN D2) 1.25 MG (50000 UT) capsule Take 50,000 Units by mouth daily in the afternoon.    [provider]  fluticasone  (FLONASE ) 50 MCG/ACT nasal spray Place 2 sprays into both nostrils daily. 02/28/24   Vivienne Delon HERO, PA-C  gabapentin  (NEURONTIN ) 300 MG capsule Take 300 mg by mouth 2 (two) times daily.      [provider]  hydrOXYzine (ATARAX) 50 MG tablet Take 50 mg by mouth 2 (two) times daily as needed for anxiety.    [provider]  loratadine  (CLARITIN ) 10 MG tablet Take 1 tablet (10 mg total) by mouth daily. 02/28/24   Vivienne Delon HERO, PA-C  methocarbamol (ROBAXIN) 500 MG tablet Take 1 tablet every 6-8 hours by oral route as needed for spasm. 12/14/22   [provider]  montelukast  (SINGULAIR ) 10 MG tablet Take 1 tablet (10 mg total) by mouth at bedtime. 02/28/24   Vivienne Delon HERO, PA-C  Multiple Vitamins-Minerals (MULTIVITAMIN WITH MINERALS) tablet Take by mouth.    [provider]  Omega-3 1000 MG CAPS Take by mouth.    [provider]  Allergies    Patient has no known allergies.   Review of Systems   Review of Systems Please see HPI for pertinent positives and negatives  Physical Exam BP 108/65 (BP Location: Left Arm)   Pulse (!) 57   Temp 98 F (36.7 C)   Resp 16   Ht 5' 6 (1.676 m)   Wt 79.4 kg   LMP 03/12/2016   SpO2 100%   BMI 28.25 kg/m   Physical Exam Vitals and nursing note reviewed.  Constitutional:      Appearance: Normal appearance.  HENT:     Head: Normocephalic and atraumatic.     Right Ear: Tympanic membrane normal.     Left Ear: Tympanic membrane normal.     Nose: Nose normal.      Mouth/Throat:     Mouth: Mucous membranes are moist.   Eyes:     Extraocular Movements: Extraocular movements intact.     Conjunctiva/sclera: Conjunctivae normal.    Cardiovascular:     Rate and Rhythm: Normal rate.  Pulmonary:     Effort: Pulmonary effort is normal.     Breath sounds: Normal breath sounds.  Abdominal:     General: Abdomen is flat.     Palpations: Abdomen is soft.     Tenderness: There is no abdominal tenderness.   Musculoskeletal:        General: No swelling. Normal range of motion.     Cervical back: Neck supple.   Skin:    General: Skin is warm and dry.   Neurological:     General: No focal deficit present.     Mental Status: She is alert and oriented to person, place, and time.     Cranial Nerves: No cranial nerve deficit.     Sensory: No sensory deficit.     Motor: No weakness.     Coordination: Coordination normal.     Gait: Gait normal.   Psychiatric:        Mood and Affect: Mood normal.     ED Results / Procedures / Treatments   EKG EKG Interpretation Date/Time:  Friday May 05 2024 23:53:47 EDT Ventricular Rate:  65 PR Interval:  130 QRS Duration:  97 QT Interval:  409 QTC Calculation: 426 R Axis:   78  Text Interpretation: Sinus rhythm Normal ECG No significant change since last tracing Confirmed by Roselyn Dunnings [phone]) on 05/06/2024 3:18:16 AM  Procedures Procedures  Medications Ordered in the ED Medications  lactated ringers  bolus 1,000 mL (1,000 mLs Intravenous New Bag/Given 05/06/24 0347)  meclizine (ANTIVERT) tablet 25 mg (25 mg Oral Given 05/06/24 0346)  ondansetron  (ZOFRAN ) injection 4 mg (4 mg Intravenous Given 05/06/24 0356)    Initial Impression and Plan  Patient with recent URI likely viral, now with an episode of vertiginous dizziness which has since resolved. Still feeling some nausea and asking for IVF. She had labs done in triage showing normal CBC and BMP.  No concern for central etiology such as posterior  stroke or mass. Will give IVF, meclizine. May benefit from a short course of steroids for her bronchitis.  ED Course   Clinical Course as of 05/06/24 0505  Sat May 06, 2024  0503 Patient feeling better, no further dizzy spells and ready to go home. Plan Rx for meclizine, zofran  as needed. Prednisone for bronchitis. PCP follow up, RTED for any other concerns.   [CS]    Clinical Course User Index [CS] Roselyn Dunnings NOVAK, MD  MDM Rules/Calculators/A&P Medical Decision Making Problems Addressed: Bronchitis: acute illness or injury Vertigo: acute illness or injury  Amount and/or Complexity of Data Reviewed Labs: ordered. Decision-making details documented in ED Course. ECG/medicine tests: ordered and independent interpretation performed. Decision-making details documented in ED Course.  Risk Prescription drug management.     Final Clinical Impression(s) / ED Diagnoses Final diagnoses:  Vertigo  Bronchitis    Rx / DC Orders ED Discharge Orders          Ordered    meclizine (ANTIVERT) 25 MG tablet  3 times daily PRN        05/06/24 0505    ondansetron  (ZOFRAN -ODT) 4 MG disintegrating tablet  Every 8 hours PRN        05/06/24 0505    predniSONE (STERAPRED UNI-PAK 21 TAB) 10 MG (21) TBPK tablet        05/06/24 0505             Roselyn Carlin NOVAK, MD 05/06/24 [phone]

## 2024-05-10 DIAGNOSIS — F411 Generalized anxiety disorder: Secondary | ICD-10-CM | POA: Diagnosis not present

## 2024-05-11 DIAGNOSIS — F411 Generalized anxiety disorder: Secondary | ICD-10-CM | POA: Diagnosis not present

## 2024-05-24 DIAGNOSIS — F411 Generalized anxiety disorder: Secondary | ICD-10-CM | POA: Diagnosis not present

## 2024-05-31 DIAGNOSIS — M545 Low back pain, unspecified: Secondary | ICD-10-CM | POA: Diagnosis not present

## 2024-06-02 DIAGNOSIS — M545 Low back pain, unspecified: Secondary | ICD-10-CM | POA: Diagnosis not present

## 2024-06-02 DIAGNOSIS — M5416 Radiculopathy, lumbar region: Secondary | ICD-10-CM | POA: Diagnosis not present

## 2024-06-06 ENCOUNTER — Other Ambulatory Visit (HOSPITAL_BASED_OUTPATIENT_CLINIC_OR_DEPARTMENT_OTHER): Payer: Self-pay | Admitting: Orthopedic Surgery

## 2024-06-06 DIAGNOSIS — M5416 Radiculopathy, lumbar region: Secondary | ICD-10-CM

## 2024-06-07 DIAGNOSIS — F411 Generalized anxiety disorder: Secondary | ICD-10-CM | POA: Diagnosis not present

## 2024-06-11 ENCOUNTER — Ambulatory Visit (HOSPITAL_BASED_OUTPATIENT_CLINIC_OR_DEPARTMENT_OTHER): Admission: RE | Admit: 2024-06-11 | Source: Ambulatory Visit

## 2024-06-11 ENCOUNTER — Ambulatory Visit (HOSPITAL_BASED_OUTPATIENT_CLINIC_OR_DEPARTMENT_OTHER)

## 2024-06-14 ENCOUNTER — Ambulatory Visit (HOSPITAL_BASED_OUTPATIENT_CLINIC_OR_DEPARTMENT_OTHER)
Admission: RE | Admit: 2024-06-14 | Discharge: 2024-06-14 | Disposition: A | Discharge status: Home / Self Care | Source: Ambulatory Visit | Attending: Orthopedic Surgery | Admitting: Orthopedic Surgery

## 2024-06-14 DIAGNOSIS — M4726 Other spondylosis with radiculopathy, lumbar region: Secondary | ICD-10-CM | POA: Diagnosis not present

## 2024-06-14 DIAGNOSIS — M5416 Radiculopathy, lumbar region: Secondary | ICD-10-CM | POA: Insufficient documentation

## 2024-06-14 DIAGNOSIS — M79604 Pain in right leg: Secondary | ICD-10-CM | POA: Diagnosis not present

## 2024-06-14 DIAGNOSIS — M79605 Pain in left leg: Secondary | ICD-10-CM | POA: Diagnosis not present

## 2024-06-14 DIAGNOSIS — M47816 Spondylosis without myelopathy or radiculopathy, lumbar region: Secondary | ICD-10-CM | POA: Diagnosis not present

## 2024-06-14 DIAGNOSIS — M5116 Intervertebral disc disorders with radiculopathy, lumbar region: Secondary | ICD-10-CM | POA: Diagnosis not present

## 2024-06-14 DIAGNOSIS — F411 Generalized anxiety disorder: Secondary | ICD-10-CM | POA: Diagnosis not present

## 2024-06-14 DIAGNOSIS — M51369 Other intervertebral disc degeneration, lumbar region without mention of lumbar back pain or lower extremity pain: Secondary | ICD-10-CM | POA: Diagnosis not present

## 2024-06-21 DIAGNOSIS — F411 Generalized anxiety disorder: Secondary | ICD-10-CM | POA: Diagnosis not present

## 2024-06-22 DIAGNOSIS — H40003 Preglaucoma, unspecified, bilateral: Secondary | ICD-10-CM | POA: Diagnosis not present

## 2024-06-22 DIAGNOSIS — H04123 Dry eye syndrome of bilateral lacrimal glands: Secondary | ICD-10-CM | POA: Diagnosis not present

## 2024-06-28 ENCOUNTER — Encounter: Admitting: Family

## 2024-06-28 DIAGNOSIS — F411 Generalized anxiety disorder: Secondary | ICD-10-CM | POA: Diagnosis not present

## 2024-07-03 DIAGNOSIS — M542 Cervicalgia: Secondary | ICD-10-CM | POA: Diagnosis not present

## 2024-07-05 DIAGNOSIS — F411 Generalized anxiety disorder: Secondary | ICD-10-CM | POA: Diagnosis not present

## 2024-07-19 DIAGNOSIS — M67911 Unspecified disorder of synovium and tendon, right shoulder: Secondary | ICD-10-CM | POA: Diagnosis not present

## 2024-07-20 ENCOUNTER — Ambulatory Visit (INDEPENDENT_AMBULATORY_CARE_PROVIDER_SITE_OTHER)

## 2024-07-20 ENCOUNTER — Telehealth: Payer: Self-pay

## 2024-07-20 DIAGNOSIS — Z0000 Encounter for general adult medical examination without abnormal findings: Secondary | ICD-10-CM | POA: Diagnosis not present

## 2024-07-20 NOTE — Telephone Encounter (Signed)
 Copied from CRM #[id number]. Topic: General - Other >> Jul 20, 2024 12:03 PM Thersia BROCKS wrote: Reason for CRM: Patient called in regarding appointment for her Annual Wellness appointment over the phone, hasn't received any call would like a callback regarding this, stated she is not sure if it is technical

## 2024-07-20 NOTE — Progress Notes (Signed)
 Subjective:   Robin Schmidt is a 58 y.o. who presents for a Medicare Wellness preventive visit.  As a reminder, Annual Wellness Visits don't include a physical exam, and some assessments may be limited, especially if this visit is performed virtually. We may recommend an in-person follow-up visit with your provider if needed.  Visit Complete: Virtual I connected with  Thalia W Bellew on 07/20/24 by a audio enabled telemedicine application and verified that I am speaking with the correct person using two identifiers.  Patient Location: Home  Provider Location: Home Office  I discussed the limitations of evaluation and management by telemedicine. The patient expressed understanding and agreed to proceed.  Vital Signs: Because this visit was a virtual/telehealth visit, some criteria may be missing or patient reported. Any vitals not documented were not able to be obtained and vitals that have been documented are patient reported.  VideoError- Librarian, academic were attempted between this provider and patient, however failed, due to patient having technical difficulties OR patient did not have access to video capability.  We continued and completed visit with audio only.   Persons Participating in Visit: Patient.  AWV Questionnaire: Yes: Patient Medicare AWV questionnaire was completed by the patient on 07/20/2024; I have confirmed that all information answered by patient is correct and no changes since this date.        Objective:    Today's Vitals   07/20/24 1351  PainSc: 8    There is no height or weight on file to calculate BMI.     07/20/2024    2:02 PM 05/05/2024   11:51 PM 02/27/2024    6:36 PM 12/31/2023   10:26 AM 01/07/2023   10:26 AM 10/20/2021    5:59 AM 12/04/2020    1:26 PM  Advanced Directives  Does Patient Have a Medical Advance Directive? No No No No No No No  Would patient like information on creating a medical advance directive? No  - Patient declined No - Patient declined  Yes (MAU/Ambulatory/Procedural Areas - Information given) No - Patient declined No - Patient declined Yes (MAU/Ambulatory/Procedural Areas - Information given)    Current Medications (verified) Outpatient Encounter Medications as of 07/20/2024  Medication Sig   benzonatate  (TESSALON ) 200 MG capsule Take 1 capsule (200 mg total) by mouth 3 (three) times daily as needed for cough.   celecoxib (CELEBREX) 200 MG capsule TAKE 1 CAPSULE BY MOUTH EVERY DAY AS NEEDED   cyclobenzaprine (FLEXERIL) 10 MG tablet Take 10 mg by mouth 3 (three) times daily as needed for muscle spasms.   diclofenac  Sodium (VOLTAREN ) 1 % GEL Apply a small amount to areas of concern twice daily   DULoxetine  (CYMBALTA ) 30 MG capsule Take 30 mg by mouth daily.   fluticasone  (FLONASE ) 50 MCG/ACT nasal spray Place 2 sprays into both nostrils daily.   gabapentin  (NEURONTIN ) 300 MG capsule Take 300 mg by mouth 2 (two) times daily.     hydrOXYzine (ATARAX) 50 MG tablet Take 50 mg by mouth 2 (two) times daily as needed for anxiety.   loratadine  (CLARITIN ) 10 MG tablet Take 1 tablet (10 mg total) by mouth daily.   meclizine  (ANTIVERT ) 25 MG tablet Take 1 tablet (25 mg total) by mouth 3 (three) times daily as needed for dizziness.   methocarbamol (ROBAXIN) 500 MG tablet Take 1 tablet every 6-8 hours by oral route as needed for spasm.   montelukast  (SINGULAIR ) 10 MG tablet Take 1 tablet (10 mg total) by  mouth at bedtime.   ondansetron  (ZOFRAN -ODT) 4 MG disintegrating tablet Take 1 tablet (4 mg total) by mouth every 8 (eight) hours as needed for nausea or vomiting.   ascorbic acid (VITAMIN C) 1000 MG tablet Take by mouth. (Patient not taking: Reported on 07/20/2024)   azithromycin  (ZITHROMAX ) 250 MG tablet Take 2 tablets on day 1, then 1 tablet daily on days 2 through 5 (Patient not taking: Reported on 07/20/2024)   calcium citrate-vitamin D  (CITRACAL+D) 315-200 MG-UNIT tablet Take by mouth. (Patient  not taking: Reported on 07/20/2024)   ergocalciferol  (VITAMIN D2) 1.25 MG (50000 UT) capsule Take 50,000 Units by mouth daily in the afternoon. (Patient not taking: Reported on 07/20/2024)   Multiple Vitamins-Minerals (MULTIVITAMIN WITH MINERALS) tablet Take by mouth. (Patient not taking: Reported on 07/20/2024)   Omega-3 1000 MG CAPS Take by mouth. (Patient not taking: Reported on 07/20/2024)   predniSONE  (STERAPRED UNI-PAK 21 TAB) 10 MG (21) TBPK tablet 10mg  Tabs, 6 day taper. Use as directed (Patient not taking: Reported on 07/20/2024)   No facility-administered encounter medications on file as of 07/20/2024.    Allergies (verified) Patient has no known allergies.   History: Past Medical History:  Diagnosis Date   Anxiety    Back pain, chronic    Depression    Past Surgical History:  Procedure Laterality Date   ANTERIOR CERVICAL DECOMP/DISCECTOMY FUSION N/A 11/22/2013   Procedure: ANTERIOR CERVICAL DECOMPRESSION/DISCECTOMY FUSION 1 LEVEL;  Surgeon: Oneil Rodgers Priestly, MD;  Location: [facility] OR;  Service: Orthopedics;  Laterality: N/A;  Anterior cervical decompression fusion, cervical 5-6 with instrumentation and allograft   back fusion     lumbar   BACK SURGERY     KNEE ARTHROSCOPY     LAPAROSCOPIC GASTRIC SLEEVE RESECTION     2015   ROTATOR CUFF REPAIR Right 12/2022   TUBAL LIGATION     History reviewed. No pertinent family history. Social History   Socioeconomic History   Marital status: Single    Spouse name: Not on file   Number of children: Not on file   Years of education: Not on file   Highest education level: Bachelor's degree (e.g., BA, AB, BS)  Occupational History   Not on file  Tobacco Use   Smoking status: Never   Smokeless tobacco: Never  Vaping Use   Vaping status: Never Used  Substance and Sexual Activity   Alcohol use: No   Drug use: No   Sexual activity: Not on file  Other Topics Concern   Not on file  Social History Narrative   Not on file    Social Drivers of Health   Financial Resource Strain: Low Risk  (07/20/2024)   Overall Financial Resource Strain (CARDIA)    Difficulty of Paying Living Expenses: Not very hard  Food Insecurity: Food Insecurity Present (07/20/2024)   Hunger Vital Sign    Worried About Running Out of Food in the Last Year: Often true    Ran Out of Food in the Last Year: Often true  Transportation Needs: No Transportation Needs (07/20/2024)   PRAPARE - Administrator, Civil Service (Medical): No    Lack of Transportation (Non-Medical): No  Physical Activity: Insufficiently Active (07/20/2024)   Exercise Vital Sign    Days of Exercise per Week: 2 days    Minutes of Exercise per Session: 20 min  Stress: Stress Concern Present (07/20/2024)   [employer] of Occupational Health - Occupational Stress Questionnaire    Feeling of Stress: Very  much  Social Connections: Moderately Integrated (07/20/2024)   Social Connection and Isolation Panel    Frequency of Communication with Friends and Family: Three times a week    Frequency of Social Gatherings with Friends and Family: Once a week    Attends Religious Services: More than 4 times per year    Active Member of [employer] or Organizations: Yes    Attends Engineer, structural: More than 4 times per year    Marital Status: Divorced    Tobacco Counseling Counseling given: Not Answered    Clinical Intake:  Pre-visit preparation completed: Yes  Pain : 0-10 Pain Score: 8  Pain Type: Chronic pain Pain Location: Back Pain Orientation: Lower Pain Radiating Towards: down legs Pain Onset: More than a month ago Pain Frequency: Constant     Nutritional Risks: None Diabetes: No  Lab Results  Component Value Date   HGBA1C 5.4 06/15/2023     How often do you need to have someone help you when you read instructions, pamphlets, or other written materials from your doctor or pharmacy?: 1 - Never  Interpreter Needed?: No  Information  entered by :: NAllen LPN   Activities of Daily Living     07/20/2024    4:18 AM  In your present state of health, do you have any difficulty performing the following activities:  Hearing? 0  Vision? 1  Comment if small writing  Difficulty concentrating or making decisions? 0  Walking or climbing stairs? 1  Dressing or bathing? 1  Doing errands, shopping? 1  Preparing Food and eating ? Y  Using the Toilet? Y  In the past six months, have you accidently leaked urine? N  Do you have problems with loss of bowel control? N  Managing your Medications? N  Managing your Finances? N  Housekeeping or managing your Housekeeping? Y    Patient Care Team: Drubel, Lindsay, PA-C as PCP - General (Physician Assistant)  I have updated your Care Teams any recent Medical Services you may have received from other providers in the past year.     Assessment:   This is a routine wellness examination for [place].  Hearing/Vision screen Hearing Screening - Comments:: Denies hearing issues Vision Screening - Comments:: Regular eye exams, [facility]   Goals Addressed             This Visit's Progress    Patient Stated       07/20/2024, wants to lose weight       Depression Screen     07/20/2024    2:04 PM 06/15/2023   10:28 AM  PHQ 2/9 Scores  PHQ - 2 Score 0 0    Fall Risk     07/20/2024    4:18 AM 11/17/2023    1:24 PM  Fall Risk   Falls in the past year? 1 1  Comment legs give out   Number falls in past yr: 1 1  Injury with Fall? 0 0  Risk for fall due to : Impaired balance/gait;Impaired mobility;Medication side effect History of fall(s)  Follow up Falls prevention discussed;Falls evaluation completed Falls evaluation completed    MEDICARE RISK AT HOME:  Medicare Risk at Home Any stairs in or around the home?: (Patient-Rptd) Yes If so, are there any without handrails?: (Patient-Rptd) Yes Home free of loose throw rugs in walkways, pet beds, electrical cords, etc?:  (Patient-Rptd) Yes Adequate lighting in your home to reduce risk of falls?: (Patient-Rptd) Yes Life alert?: (Patient-Rptd) No Use  of a cane, walker or w/c?: (Patient-Rptd) Yes Grab bars in the bathroom?: (Patient-Rptd) No Shower chair or bench in shower?: (Patient-Rptd) No Elevated toilet seat or a handicapped toilet?: (Patient-Rptd) No  TIMED UP AND GO:  Was the test performed?  No  Cognitive Function: 6CIT completed        07/20/2024    2:04 PM  6CIT Screen  What Year? 0 points  What month? 0 points  What time? 0 points  Count back from 20 0 points  Months in reverse 0 points  Repeat phrase 6 points  Total Score 6 points    Immunizations Immunization History  Administered Date(s) Administered   Tdap 05/05/2014, 07/23/2017    Screening Tests Health Maintenance  Topic Date Due   COVID-19 Vaccine (1) Never done   HIV Screening  Never done   Hepatitis C Screening  Never done   Hepatitis B Vaccines 19-59 Average Risk (1 of 3 - 19+ 3-dose series) Never done   Zoster Vaccines- Shingrix (1 of 2) Never done   Cervical Cancer Screening (HPV/Pap Cotest)  05/24/2014   Pneumococcal Vaccine: 50+ Years (1 of 1 - PCV) Never done   Influenza Vaccine  Never done   Medicare Annual Wellness (AWV)  07/20/2025   Mammogram  08/16/2025   Fecal DNA (Cologuard)  06/20/2026   DTaP/Tdap/Td (3 - Td or Tdap) 07/24/2027   HPV VACCINES  Aged Out   Meningococcal B Vaccine  Aged Out    Health Maintenance Items Addressed: Due for flu, pneumonia, shingles and covid vaccine.  Additional Screening:  Vision Screening: Recommended annual ophthalmology exams for early detection of glaucoma and other disorders of the eye. Is the patient up to date with their annual eye exam?  Yes  Who is the provider or what is the name of the office in which the patient attends annual eye exams? [facility]  Dental Screening: Recommended annual dental exams for proper oral hygiene  Community Resource  Referral / Chronic Care Management: CRR required this visit?  No   CCM required this visit?  No   Plan:    I have personally reviewed and noted the following in the patient's chart:   Medical and social history Use of alcohol, tobacco or illicit drugs  Current medications and supplements including opioid prescriptions. Patient is not currently taking opioid prescriptions. Functional ability and status Nutritional status Physical activity Advanced directives List of other physicians Hospitalizations, surgeries, and ER visits in previous 12 months Vitals Screenings to include cognitive, depression, and falls Referrals and appointments  In addition, I have reviewed and discussed with patient certain preventive protocols, quality metrics, and best practice recommendations. A written personalized care plan for preventive services as well as general preventive health recommendations were provided to patient.   Ardella FORBES Dawn, LPN   [id number]   After Visit Summary: (MyChart) Due to this being a telephonic visit, the after visit summary with patients personalized plan was offered to patient via MyChart   Notes: Nothing significant to report at this time.

## 2024-07-20 NOTE — Patient Instructions (Signed)
 [name],  Thank you for taking the time for your Medicare Wellness Visit. I appreciate your continued commitment to your health goals. Please review the care plan we discussed, and feel free to reach out if I can assist you further.  Medicare recommends these wellness visits once per year to help you and your care team stay ahead of potential health issues. These visits are designed to focus on prevention, allowing your provider to concentrate on managing your acute and chronic conditions during your regular appointments.  Please note that Annual Wellness Visits do not include a physical exam. Some assessments may be limited, especially if the visit was conducted virtually. If needed, we may recommend a separate in-person follow-up with your provider.  Ongoing Care Seeing your primary care provider every 3 to 6 months helps us  monitor your health and provide consistent, personalized care.   Referrals If a referral was made during today's visit and you haven't received any updates within two weeks, please contact the referred provider directly to check on the status.  Recommended Screenings:  Health Maintenance  Topic Date Due   COVID-19 Vaccine (1) Never done   HIV Screening  Never done   Hepatitis C Screening  Never done   Hepatitis B Vaccine (1 of 3 - 19+ 3-dose series) Never done   Zoster (Shingles) Vaccine (1 of 2) Never done   Pap with HPV screening  05/24/2014   Pneumococcal Vaccine for age over 30 (1 of 1 - PCV) Never done   Flu Shot  Never done   Medicare Annual Wellness Visit  07/20/2025   Breast Cancer Screening  08/16/2025   Cologuard (Stool DNA test)  06/20/2026   DTaP/Tdap/Td vaccine (3 - Td or Tdap) 07/24/2027   HPV Vaccine  Aged Out   Meningitis B Vaccine  Aged Out       07/20/2024    2:02 PM  Advanced Directives  Does Patient Have a Medical Advance Directive? No  Would patient like information on creating a medical advance directive? No - Patient declined    Advance Care Planning is important because it: Ensures you receive medical care that aligns with your values, goals, and preferences. Provides guidance to your family and loved ones, reducing the emotional burden of decision-making during critical moments.  Vision: Annual vision screenings are recommended for early detection of glaucoma, cataracts, and diabetic retinopathy. These exams can also reveal signs of chronic conditions such as diabetes and high blood pressure.  Dental: Annual dental screenings help detect early signs of oral cancer, gum disease, and other conditions linked to overall health, including heart disease and diabetes.  Please see the attached documents for additional preventive care recommendations.

## 2024-08-02 DIAGNOSIS — F419 Anxiety disorder, unspecified: Secondary | ICD-10-CM | POA: Diagnosis not present

## 2024-08-08 ENCOUNTER — Ambulatory Visit: Admitting: Family

## 2024-08-08 DIAGNOSIS — F411 Generalized anxiety disorder: Secondary | ICD-10-CM | POA: Diagnosis not present

## 2024-08-08 DIAGNOSIS — F410 Panic disorder [episodic paroxysmal anxiety] without agoraphobia: Secondary | ICD-10-CM | POA: Diagnosis not present

## 2024-08-16 DIAGNOSIS — F331 Major depressive disorder, recurrent, moderate: Secondary | ICD-10-CM | POA: Diagnosis not present

## 2024-08-16 DIAGNOSIS — F410 Panic disorder [episodic paroxysmal anxiety] without agoraphobia: Secondary | ICD-10-CM | POA: Diagnosis not present

## 2024-08-16 DIAGNOSIS — F411 Generalized anxiety disorder: Secondary | ICD-10-CM | POA: Diagnosis not present

## 2024-08-25 ENCOUNTER — Ambulatory Visit: Admitting: Family

## 2024-08-30 DIAGNOSIS — M25511 Pain in right shoulder: Secondary | ICD-10-CM | POA: Diagnosis not present

## 2024-10-03 ENCOUNTER — Other Ambulatory Visit (HOSPITAL_BASED_OUTPATIENT_CLINIC_OR_DEPARTMENT_OTHER): Payer: Self-pay | Admitting: Family

## 2024-10-03 DIAGNOSIS — Z1231 Encounter for screening mammogram for malignant neoplasm of breast: Secondary | ICD-10-CM

## 2024-10-06 DIAGNOSIS — F411 Generalized anxiety disorder: Secondary | ICD-10-CM | POA: Diagnosis not present

## 2024-10-06 DIAGNOSIS — F410 Panic disorder [episodic paroxysmal anxiety] without agoraphobia: Secondary | ICD-10-CM | POA: Diagnosis not present

## 2024-10-06 DIAGNOSIS — F331 Major depressive disorder, recurrent, moderate: Secondary | ICD-10-CM | POA: Diagnosis not present

## 2024-10-08 ENCOUNTER — Ambulatory Visit (HOSPITAL_BASED_OUTPATIENT_CLINIC_OR_DEPARTMENT_OTHER)

## 2024-10-11 DIAGNOSIS — F411 Generalized anxiety disorder: Secondary | ICD-10-CM | POA: Diagnosis not present

## 2024-10-11 DIAGNOSIS — F331 Major depressive disorder, recurrent, moderate: Secondary | ICD-10-CM | POA: Diagnosis not present

## 2024-10-16 DIAGNOSIS — M47816 Spondylosis without myelopathy or radiculopathy, lumbar region: Secondary | ICD-10-CM | POA: Diagnosis not present

## 2024-10-16 DIAGNOSIS — Z981 Arthrodesis status: Secondary | ICD-10-CM | POA: Diagnosis not present

## 2024-10-16 DIAGNOSIS — M25511 Pain in right shoulder: Secondary | ICD-10-CM | POA: Diagnosis not present

## 2024-10-19 DIAGNOSIS — F410 Panic disorder [episodic paroxysmal anxiety] without agoraphobia: Secondary | ICD-10-CM | POA: Diagnosis not present

## 2024-10-19 DIAGNOSIS — F411 Generalized anxiety disorder: Secondary | ICD-10-CM | POA: Diagnosis not present

## 2024-10-19 DIAGNOSIS — F331 Major depressive disorder, recurrent, moderate: Secondary | ICD-10-CM | POA: Diagnosis not present

## 2024-10-27 DIAGNOSIS — F331 Major depressive disorder, recurrent, moderate: Secondary | ICD-10-CM | POA: Diagnosis not present

## 2024-10-27 DIAGNOSIS — F410 Panic disorder [episodic paroxysmal anxiety] without agoraphobia: Secondary | ICD-10-CM | POA: Diagnosis not present

## 2024-10-27 DIAGNOSIS — F411 Generalized anxiety disorder: Secondary | ICD-10-CM | POA: Diagnosis not present

## 2024-11-15 ENCOUNTER — Ambulatory Visit: Admitting: Family

## 2024-11-15 ENCOUNTER — Ambulatory Visit

## 2024-11-15 ENCOUNTER — Encounter: Payer: Self-pay | Admitting: Family

## 2024-11-15 VITALS — BP 128/78 | HR 85 | Temp 97.6°F | Ht 66.0 in | Wt 176.4 lb

## 2024-11-15 DIAGNOSIS — J302 Other seasonal allergic rhinitis: Secondary | ICD-10-CM | POA: Diagnosis not present

## 2024-11-15 DIAGNOSIS — Z23 Encounter for immunization: Secondary | ICD-10-CM | POA: Diagnosis not present

## 2024-11-15 DIAGNOSIS — Z0000 Encounter for general adult medical examination without abnormal findings: Secondary | ICD-10-CM

## 2024-11-15 DIAGNOSIS — F419 Anxiety disorder, unspecified: Secondary | ICD-10-CM | POA: Diagnosis not present

## 2024-11-15 DIAGNOSIS — E538 Deficiency of other specified B group vitamins: Secondary | ICD-10-CM

## 2024-11-15 DIAGNOSIS — R002 Palpitations: Secondary | ICD-10-CM

## 2024-11-15 DIAGNOSIS — G4733 Obstructive sleep apnea (adult) (pediatric): Secondary | ICD-10-CM | POA: Diagnosis not present

## 2024-11-15 DIAGNOSIS — F4310 Post-traumatic stress disorder, unspecified: Secondary | ICD-10-CM | POA: Diagnosis not present

## 2024-11-15 DIAGNOSIS — M75121 Complete rotator cuff tear or rupture of right shoulder, not specified as traumatic: Secondary | ICD-10-CM | POA: Diagnosis not present

## 2024-11-15 DIAGNOSIS — G8929 Other chronic pain: Secondary | ICD-10-CM

## 2024-11-15 DIAGNOSIS — M545 Low back pain, unspecified: Secondary | ICD-10-CM

## 2024-11-15 LAB — COMPREHENSIVE METABOLIC PANEL WITH GFR
ALT: 10 U/L (ref 3–35)
AST: 13 U/L (ref 5–37)
Albumin: 4 g/dL (ref 3.5–5.2)
Alkaline Phosphatase: 86 U/L (ref 39–117)
BUN: 19 mg/dL (ref 6–23)
CO2: 30 meq/L (ref 19–32)
Calcium: 9.2 mg/dL (ref 8.4–10.5)
Chloride: 105 meq/L (ref 96–112)
Creatinine, Ser: 0.81 mg/dL (ref 0.40–1.20)
GFR: 79.27 mL/min
Glucose, Bld: 84 mg/dL (ref 70–99)
Potassium: 4.2 meq/L (ref 3.5–5.1)
Sodium: 142 meq/L (ref 135–145)
Total Bilirubin: 0.6 mg/dL (ref 0.2–1.2)
Total Protein: 7.2 g/dL (ref 6.0–8.3)

## 2024-11-15 LAB — CBC WITH DIFFERENTIAL/PLATELET
Basophils Absolute: 0 K/uL (ref 0.0–0.1)
Basophils Relative: 0.7 % (ref 0.0–3.0)
Eosinophils Absolute: 0.1 K/uL (ref 0.0–0.7)
Eosinophils Relative: 1.1 % (ref 0.0–5.0)
HCT: 39 % (ref 36.0–46.0)
Hemoglobin: 12.7 g/dL (ref 12.0–15.0)
Lymphocytes Relative: 34 % (ref 12.0–46.0)
Lymphs Abs: 1.6 K/uL (ref 0.7–4.0)
MCHC: 32.6 g/dL (ref 30.0–36.0)
MCV: 82.7 fl (ref 78.0–100.0)
Monocytes Absolute: 0.3 K/uL (ref 0.1–1.0)
Monocytes Relative: 6.3 % (ref 3.0–12.0)
Neutro Abs: 2.7 K/uL (ref 1.4–7.7)
Neutrophils Relative %: 57.9 % (ref 43.0–77.0)
Platelets: 241 K/uL (ref 150.0–400.0)
RBC: 4.71 Mil/uL (ref 3.87–5.11)
RDW: 14.6 % (ref 11.5–15.5)
WBC: 4.7 K/uL (ref 4.0–10.5)

## 2024-11-15 LAB — TSH: TSH: 1.33 u[IU]/mL (ref 0.35–5.50)

## 2024-11-15 LAB — VITAMIN B12: Vitamin B-12: 244 pg/mL (ref 211–911)

## 2024-11-15 MED ORDER — DULOXETINE HCL 60 MG PO CPEP: 60.0000 mg | ORAL_CAPSULE | Freq: Every day | ORAL | 0 refills | Status: AC

## 2024-11-15 NOTE — Progress Notes (Unsigned)
 EP to read.

## 2024-11-15 NOTE — Assessment & Plan Note (Addendum)
 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

## 2024-11-15 NOTE — Assessment & Plan Note (Addendum)
" °  Residual pain and possible cervical radiculopathy post-surgery. Symptoms suggest pinched nerve. - Continue follow-up with orthopedic specialist for shoulder and possible cervical radiculopathy. "

## 2024-11-15 NOTE — Progress Notes (Signed)
 "  Subjective:     Patient ID: Robin Schmidt, female    DOB: 1966/09/16, 59 y.o.   MRN: [id number]  Chief Complaint  Patient presents with   Transitions Of Care    HPI  Discussed the use of AI scribe software for clinical note transcription with the patient, who gave verbal consent to proceed.  History of Present Illness Robin Schmidt is a 59 year old female who presents with heart palpitations.  She has been experiencing heart palpitations since February 2023, primarily associated with work-related stress. These episodes last for about one to two hours and have not been evaluated by a cardiologist. An EKG in the past showed no cardiac issues. She is currently seeing a therapist for PTSD, anxiety, and depression.  She has a history of back issues, having undergone three back surgeries, and is on disability due to these issues. She takes Celebrex routinely for her back pain and uses Flexeril and Cymbalta  for pain management. She also takes gabapentin  twice a day for pain.  She underwent shoulder surgery for a rotator cuff tear on her right side about two years ago and continues to experience pain and numbness radiating down her arm, which she attributes to a pinched nerve. A nerve study suggested a pinched nerve. No neck pain is reported.  She has a history of asthma with one episode treated as an upper respiratory issue. She uses Flonase  and Claritin  seasonally for sinus and allergy symptoms.  She underwent weight loss surgery, reducing her weight from 246 pounds, and reports no longer experiencing snoring or sleep apnea symptoms. She has never used CPAP.  She has a history of vertigo, for which she was prescribed meclizine , and reports an episode of dizziness that prevented her from driving.  She takes hydroxyzine for anxiety, which helps her sleep, and is on a low dose of Cymbalta  for pain. She also takes a multivitamin, omega-3, and has previously received B12 shots but prefers  pills.  She is divorced, has four children and four grandchildren, and is currently out of work due to stress-related issues. She is pursuing a degree at [employer].      Health Maintenance Due  Topic Date Due   COVID-19 Vaccine (1) Never done   HIV Screening  Never done   Hepatitis C Screening  Never done   Hepatitis B Vaccines 19-59 Average Risk (1 of 3 - 19+ 3-dose series) Never done   Cervical Cancer Screening (HPV/Pap Cotest)  05/24/2014   Pneumococcal Vaccine: 50+ Years (1 of 1 - PCV) Never done   Influenza Vaccine  Never done    Past Medical History:  Diagnosis Date   Anxiety    Back pain, chronic    Depression     Past Surgical History:  Procedure Laterality Date   ANTERIOR CERVICAL DECOMP/DISCECTOMY FUSION N/A 11/22/2013   Procedure: ANTERIOR CERVICAL DECOMPRESSION/DISCECTOMY FUSION 1 LEVEL;  Surgeon: Oneil Rodgers Priestly, MD;  Location: [facility] OR;  Service: Orthopedics;  Laterality: N/A;  Anterior cervical decompression fusion, cervical 5-6 with instrumentation and allograft   back fusion     lumbar   BACK SURGERY     KNEE ARTHROSCOPY     LAPAROSCOPIC GASTRIC SLEEVE RESECTION     2015   ROTATOR CUFF REPAIR Right 12/2022   TUBAL LIGATION      No family history on file.  Social History   Socioeconomic History   Marital status: Single    Spouse name: Not on file  Number of children: Not on file   Years of education: Not on file   Highest education level: Bachelor's degree (e.g., BA, AB, BS)  Occupational History   Not on file  Tobacco Use   Smoking status: Never   Smokeless tobacco: Never  Vaping Use   Vaping status: Never Used  Substance and Sexual Activity   Alcohol use: No   Drug use: No   Sexual activity: Not on file  Other Topics Concern   Not on file  Social History Narrative   On disability due to low back pain   Previously worked for [employer]   4 children (2 daughters 2 sons) also has 5 grandchildren   3 children are  local      Jimmy [place]   Tyler [place]   Kayla- local   Son- lives with her and helps with pt's mother as caregiver      Divorced was married to a emergency planning/management officer 1986   Completed most of a bachelors degree from [employer]   Social Drivers of Health   Tobacco Use: Low Risk (11/15/2024)   Patient History    Smoking Tobacco Use: Never    Smokeless Tobacco Use: Never    Passive Exposure: Not on file  Financial Resource Strain: Low Risk (07/20/2024)   Overall Financial Resource Strain (CARDIA)    Difficulty of Paying Living Expenses: Not very hard  Food Insecurity: Food Insecurity Present (07/20/2024)   Epic    Worried About Programme Researcher, Broadcasting/film/video in the Last Year: Often true    Ran Out of Food in the Last Year: Often true  Transportation Needs: No Transportation Needs (07/20/2024)   Epic    Lack of Transportation (Medical): No    Lack of Transportation (Non-Medical): No  Physical Activity: Insufficiently Active (07/20/2024)   Exercise Vital Sign    Days of Exercise per Week: 2 days    Minutes of Exercise per Session: 20 min  Stress: Stress Concern Present (07/20/2024)   [employer] of Occupational Health - Occupational Stress Questionnaire    Feeling of Stress: Very much  Social Connections: Moderately Integrated (07/20/2024)   Social Connection and Isolation Panel    Frequency of Communication with Friends and Family: Three times a week    Frequency of Social Gatherings with Friends and Family: Once a week    Attends Religious Services: More than 4 times per year    Active Member of Clubs or Organizations: Yes    Attends Banker Meetings: More than 4 times per year    Marital Status: Divorced  Intimate Partner Violence: Not At Risk (07/20/2024)   Epic    Fear of Current or Ex-Partner: No    Emotionally Abused: No    Physically Abused: No    Sexually Abused: No  Depression (PHQ2-9): High Risk (11/15/2024)   Depression (PHQ2-9)    PHQ-2 Score: 18  Alcohol Screen: Low  Risk (07/20/2024)   Alcohol Screen    Last Alcohol Screening Score (AUDIT): 0  Housing: High Risk (07/20/2024)   Epic    Unable to Pay for Housing in the Last Year: Yes    Number of Times Moved in the Last Year: 0    Homeless in the Last Year: No  Utilities: Not At Risk (07/20/2024)   Epic    Threatened with loss of utilities: No  Health Literacy: Adequate Health Literacy (07/20/2024)   B1300 Health Literacy    Frequency of need for help with medical  instructions: Never    Outpatient Medications Prior to Visit  Medication Sig Dispense Refill   ascorbic acid (VITAMIN C) 1000 MG tablet Take by mouth.     calcium citrate-vitamin D  (CITRACAL+D) 315-200 MG-UNIT tablet Take by mouth.     celecoxib (CELEBREX) 200 MG capsule TAKE 1 CAPSULE BY MOUTH EVERY DAY AS NEEDED     cyclobenzaprine (FLEXERIL) 10 MG tablet Take 10 mg by mouth 3 (three) times daily as needed for muscle spasms.     ergocalciferol  (VITAMIN D2) 1.25 MG (50000 UT) capsule Take 50,000 Units by mouth daily in the afternoon.     fluticasone  (FLONASE ) 50 MCG/ACT nasal spray Place 2 sprays into both nostrils daily. 16 g 0   gabapentin  (NEURONTIN ) 300 MG capsule Take 300 mg by mouth 2 (two) times daily.       hydrOXYzine (ATARAX) 50 MG tablet Take 50 mg by mouth 2 (two) times daily as needed for anxiety.     loratadine  (CLARITIN ) 10 MG tablet Take 1 tablet (10 mg total) by mouth daily. 90 tablet 0   methocarbamol (ROBAXIN) 500 MG tablet Take 1 tablet every 6-8 hours by oral route as needed for spasm.     montelukast  (SINGULAIR ) 10 MG tablet Take 1 tablet (10 mg total) by mouth at bedtime. 30 tablet 0   Multiple Vitamins-Minerals (MULTIVITAMIN WITH MINERALS) tablet Take by mouth.     Omega-3 1000 MG CAPS Take by mouth.     ondansetron  (ZOFRAN -ODT) 4 MG disintegrating tablet Take 1 tablet (4 mg total) by mouth every 8 (eight) hours as needed for nausea or vomiting. 20 tablet 0   azithromycin  (ZITHROMAX ) 250 MG tablet Take 2 tablets on  day 1, then 1 tablet daily on days 2 through 5 6 tablet 0   benzonatate  (TESSALON ) 200 MG capsule Take 1 capsule (200 mg total) by mouth 3 (three) times daily as needed for cough. 30 capsule 0   diclofenac  Sodium (VOLTAREN ) 1 % GEL Apply a small amount to areas of concern twice daily 150 g 2   DULoxetine  (CYMBALTA ) 30 MG capsule Take 30 mg by mouth daily.     meclizine  (ANTIVERT ) 25 MG tablet Take 1 tablet (25 mg total) by mouth 3 (three) times daily as needed for dizziness. 30 tablet 0   predniSONE  (STERAPRED UNI-PAK 21 TAB) 10 MG (21) TBPK tablet 10mg  Tabs, 6 day taper. Use as directed 1 each 0   No facility-administered medications prior to visit.    Allergies[1]  ROS See HPI    Objective:    Physical Exam Constitutional:      General: She is not in acute distress.    Appearance: Normal appearance. She is well-developed.  HENT:     Head: Normocephalic and atraumatic.     Right Ear: Tympanic membrane, ear canal and external ear normal.     Left Ear: Tympanic membrane, ear canal and external ear normal.  Eyes:     General: No scleral icterus. Neck:     Thyroid : No thyromegaly.  Cardiovascular:     Rate and Rhythm: Normal rate and regular rhythm.     Heart sounds: Normal heart sounds. No murmur heard. Pulmonary:     Effort: Pulmonary effort is normal. No respiratory distress.     Breath sounds: Normal breath sounds. No wheezing.  Musculoskeletal:     Cervical back: Neck supple.  Skin:    General: Skin is warm and dry.  Neurological:     Mental Status: She is  alert and oriented to person, place, and time.  Psychiatric:        Mood and Affect: Mood normal.        Behavior: Behavior normal.        Thought Content: Thought content normal.        Judgment: Judgment normal.      BP 128/78 (BP Location: Right Arm, Patient Position: Sitting, Cuff Size: Normal)   Pulse 85   Temp 97.6 F (36.4 C) (Oral)   Ht 5' 6 (1.676 m)   Wt 176 lb 6.4 oz (80 kg)   LMP 03/12/2016    SpO2 100%   BMI 28.47 kg/m  Wt Readings from Last 3 Encounters:  11/15/24 176 lb 6.4 oz (80 kg)  05/05/24 175 lb (79.4 kg)  03/08/24 194 lb 3.2 oz (88.1 kg)       Assessment & Plan:   Problem List Items Addressed This Visit       Unprioritized   Seasonal allergies - Primary    Managed with Claritin  and Flonase . - Continue Claritin  and Flonase  for allergy management.       PTSD (post-traumatic stress disorder)    PTSD with anxiety and depression related to work stress. Reports improvement with therapy and medication. - Continue therapy and current medication regimen. - Increased Cymbalta  to 60 mg for potential anxiety benefits.      Relevant Medications   DULoxetine  (CYMBALTA ) 60 MG capsule   Obstructive sleep apnea   No longer snoring following bariatric surgery.       Heart palpitations   EKG tracing is personally reviewed.  EKG notes NSR.  No acute changes. Note is make of short PR interval which was not noted on previous EKG's.   Intermittent palpitations which seem to occur while anxious.   - Ordered heart monitor for one week to correlate palpitations with heart rhythms. - Refer to cardiology if abnormal rhythms detected.       Relevant Orders   LONG TERM MONITOR XT (3-14 DAYS)   EKG 12-Lead (Completed)   Comp Met (CMET) (Completed)   TSH (Completed)   CBC w/Diff (Completed)   Healthcare maintenance    Discussed immunizations and routine screenings. Shingles vaccine administered. - Administered first dose of shingles vaccine today. - Schedule second dose of shingles vaccine and hepatitis B vaccine in two months. - Perform Pap smear at next visit. - Mammogram is scheduled for January.       Full thickness rotator cuff tear    Residual pain and possible cervical radiculopathy post-surgery. Symptoms suggest pinched nerve. - Continue follow-up with orthopedic specialist for shoulder and possible cervical radiculopathy.      Chronic lower back pain    She has previous hx of back surgeries- followed at [facility], uses celebrex prn. She is on disability due to back issues.      Relevant Medications   DULoxetine  (CYMBALTA ) 60 MG capsule   B12 deficiency   Previously managed with B12 shots. Prefers oral supplementation. - Checked B12 levels. - Consider oral B12 supplementation or continue shots if levels significantly low.       Relevant Orders   B12 (Completed)   Anxiety   Currently maintained on cymbalta  30mg  for pain management purposes.  Anxiety is uncontrolled. Will increase cymbalta  to 60 mg to see if this helps.      Relevant Medications   DULoxetine  (CYMBALTA ) 60 MG capsule   Other Visit Diagnoses       Need for shingles  vaccine       Relevant Orders   Varicella-zoster vaccine IM (Completed)        In addition to time spent interpreting EKG, I personally spent a total of 45 minutes in the care of the patient today including preparing to see the patient, counseling and educating, documenting clinical information in the EHR, and communicating results.  Assessment & Plan    I have discontinued [facility]'s DULoxetine , diclofenac  Sodium, azithromycin , benzonatate , meclizine , and predniSONE . I am also having her start on DULoxetine . Additionally, I am having her maintain her gabapentin , cyclobenzaprine, Omega-3, multivitamin with minerals, calcium citrate-vitamin D , ascorbic acid, celecoxib, methocarbamol, hydrOXYzine, ergocalciferol , fluticasone , loratadine , montelukast , and ondansetron .  Meds ordered this encounter  Medications   DULoxetine  (CYMBALTA ) 60 MG capsule    Sig: Take 1 capsule (60 mg total) by mouth daily.    Dispense:  90 capsule    Refill:  0    Supervising Provider:   DOMENICA BLACKBIRD A [[id number]]      [1] No Known Allergies  "

## 2024-11-15 NOTE — Assessment & Plan Note (Addendum)
" °  PTSD with anxiety and depression related to work stress. Reports improvement with therapy and medication. - Continue therapy and current medication regimen. - Increased Cymbalta  to 60 mg for potential anxiety benefits. "

## 2024-11-15 NOTE — Assessment & Plan Note (Signed)
 She has previous hx of back surgeries- followed at [facility], uses celebrex prn. She is on disability due to back issues.

## 2024-11-16 ENCOUNTER — Ambulatory Visit: Payer: Self-pay | Admitting: Family

## 2024-11-16 DIAGNOSIS — J302 Other seasonal allergic rhinitis: Secondary | ICD-10-CM | POA: Insufficient documentation

## 2024-11-16 DIAGNOSIS — F419 Anxiety disorder, unspecified: Secondary | ICD-10-CM | POA: Insufficient documentation

## 2024-11-16 DIAGNOSIS — Z0000 Encounter for general adult medical examination without abnormal findings: Secondary | ICD-10-CM | POA: Insufficient documentation

## 2024-11-16 DIAGNOSIS — E538 Deficiency of other specified B group vitamins: Secondary | ICD-10-CM | POA: Insufficient documentation

## 2024-11-16 NOTE — Assessment & Plan Note (Addendum)
 EKG tracing is personally reviewed.  EKG notes NSR.  No acute changes. Note is make of short PR interval which was not noted on previous EKG's.   Intermittent palpitations which seem to occur while anxious.   - Ordered heart monitor for one week to correlate palpitations with heart rhythms. - Refer to cardiology if abnormal rhythms detected.

## 2024-11-16 NOTE — Assessment & Plan Note (Signed)
 Currently maintained on cymbalta  30mg  for pain management purposes.  Anxiety is uncontrolled. Will increase cymbalta  to 60 mg to see if this helps.

## 2024-11-16 NOTE — Assessment & Plan Note (Signed)
" °  Managed with Claritin  and Flonase . - Continue Claritin  and Flonase  for allergy management.  "

## 2024-11-16 NOTE — Patient Instructions (Addendum)
" °  VISIT SUMMARY: Today, you were seen for heart palpitations, chronic low back pain, residual pain from a right rotator cuff tear, PTSD with anxiety and depression, and seasonal allergies. We discussed your current symptoms, reviewed your medications, and made some adjustments to your treatment plan. We also addressed your general health maintenance needs, including vaccinations and routine screenings.  YOUR PLAN: -HEART PALPITATIONS: Heart palpitations are feelings of having a fast-beating, fluttering, or pounding heart. They can be caused by stress or an irregular heart rhythm. We ordered an EKG and a one-week heart monitor to check your heart rhythms. If we find any abnormal rhythms, we will refer you to a cardiologist.  -CHRONIC LOW BACK PAIN: Chronic low back pain is long-lasting pain in the lower back. You have a history of back surgeries and are managing the pain with Celebrex and Cymbalta . We will continue with Celebrex and have increased your Cymbalta  dose to 60 mg to help with anxiety as well.  -RIGHT ROTATOR CUFF TEAR WITH RESIDUAL PAIN AND POSSIBLE CERVICAL RADICULOPATHY: A rotator cuff tear is a tear in the shoulder muscles, and cervical radiculopathy is a pinched nerve in the neck. You have residual pain and numbness in your arm. We recommend continuing follow-up with your orthopedic specialist.  -POST-TRAUMATIC STRESS DISORDER WITH ANXIETY AND DEPRESSION: PTSD is a mental health condition triggered by a terrifying event, causing anxiety and depression. You are seeing a therapist and taking medication, which has shown improvement. We have increased your Cymbalta  dose to 60 mg to further help with anxiety.  -SEASONAL ALLERGIC RHINITIS: Seasonal allergic rhinitis is an allergic reaction to pollen, causing sneezing and a runny nose. You are managing it with Claritin  and Flonase , and we recommend continuing these medications.  -VITAMIN B12 DEFICIENCY: Vitamin B12 deficiency occurs when your  body lacks enough B12, which is important for nerve function and blood cell production. We checked your B12 levels and will consider oral B12 supplements or continue shots if your levels are low.  ANXIETY- uncontrolled. Please increase cymbalta  from 30 mg to 60 mg once daily.   -GENERAL HEALTH MAINTENANCE: We discussed your immunizations and routine screenings. You received the first dose of the shingles vaccine today. We will schedule the second dose of the shingles vaccine and the hepatitis B vaccine in two months. A Pap smear will be performed at your next visit, and your mammogram is scheduled for January.  INSTRUCTIONS: Please follow up with the heart monitor results and schedule an appointment if you experience any new or worsening symptoms. Your next dose of the shingles vaccine and the hepatitis B vaccine are due in two months. Please also schedule a Pap smear at your next visit and keep your mammogram appointment in January.                                              "

## 2024-11-16 NOTE — Telephone Encounter (Signed)
 B12 level is low.  Please start b12 injections 1000 mcg IM weekly x 4 weeks then monthly.

## 2024-11-16 NOTE — Assessment & Plan Note (Addendum)
 Previously managed with B12 shots. Prefers oral supplementation. - Checked B12 levels. - Consider oral B12 supplementation or continue shots if levels significantly low.

## 2024-11-16 NOTE — Assessment & Plan Note (Signed)
" °  Discussed immunizations and routine screenings. Shingles vaccine administered. - Administered first dose of shingles vaccine today. - Schedule second dose of shingles vaccine and hepatitis B vaccine in two months. - Perform Pap smear at next visit. - Mammogram is scheduled for January.  "

## 2024-11-20 ENCOUNTER — Telehealth: Payer: Self-pay | Admitting: Physician Assistant

## 2024-11-20 NOTE — Telephone Encounter (Signed)
 Per patient this message was not intended to go to Primary care. She will call correct office.

## 2024-11-20 NOTE — Telephone Encounter (Signed)
 Copied from CRM #[id number]. Topic: General - Other >> Nov 20, 2024 12:59 PM Zebedee SAUNDERS wrote: Reason for CRM: Pt called stating she had sent message via MyChart regarding leave of absence regarding existing letter update dates to 11/13/2024 - 05/13/2025. Pt would like a call back

## 2024-11-21 ENCOUNTER — Telehealth: Payer: Self-pay | Admitting: Family

## 2024-11-21 NOTE — Telephone Encounter (Signed)
 Patient notified there is not documentation about her having incontinence, she reports she has not talked to pcp about this. I explained to her that the order for supplies  has to be submitted with documentation.  She will wait until her next appointment to discuss and start order.  [facility] notified of this information

## 2024-11-21 NOTE — Telephone Encounter (Signed)
 Copied from CRM [id number]. Topic: Medical Record Request - Provider/Facility Request >> Nov 21, 2024 11:07 AM Tinnie BROCKS wrote: Reason for CRM: Felicia with [facility] is calling to see if we have received a fax with the request for office notes regarding incontinence supplies. She says this was faxed 12/8 and 1/7 and fax # was confirmed. Requesting office notes related to incontinence supplies be faxed to: [phone]

## 2024-11-24 ENCOUNTER — Ambulatory Visit

## 2024-11-25 ENCOUNTER — Inpatient Hospital Stay (HOSPITAL_BASED_OUTPATIENT_CLINIC_OR_DEPARTMENT_OTHER): Admission: RE | Admit: 2024-11-25 | Source: Ambulatory Visit

## 2024-11-27 ENCOUNTER — Other Ambulatory Visit (HOSPITAL_BASED_OUTPATIENT_CLINIC_OR_DEPARTMENT_OTHER): Payer: Self-pay | Admitting: Family

## 2024-11-27 DIAGNOSIS — Z1231 Encounter for screening mammogram for malignant neoplasm of breast: Secondary | ICD-10-CM

## 2024-12-01 ENCOUNTER — Ambulatory Visit

## 2025-01-30 ENCOUNTER — Encounter: Admitting: Family

## 2025-08-07 ENCOUNTER — Ambulatory Visit
